# Patient Record
Sex: Female | Born: 1990 | Race: White | Hispanic: No | Smoking: Former smoker
Health system: Southern US, Community
[De-identification: ages and names within clinical notes are randomized; demographics above are authoritative.]

## PROBLEM LIST (undated history)

## (undated) ENCOUNTER — Inpatient Hospital Stay (HOSPITAL_COMMUNITY): Payer: Self-pay

## (undated) DIAGNOSIS — N390 Urinary tract infection, site not specified: Secondary | ICD-10-CM

## (undated) DIAGNOSIS — C539 Malignant neoplasm of cervix uteri, unspecified: Secondary | ICD-10-CM

## (undated) DIAGNOSIS — A749 Chlamydial infection, unspecified: Secondary | ICD-10-CM

## (undated) DIAGNOSIS — R51 Headache: Secondary | ICD-10-CM

## (undated) DIAGNOSIS — F32A Depression, unspecified: Secondary | ICD-10-CM

## (undated) DIAGNOSIS — F329 Major depressive disorder, single episode, unspecified: Secondary | ICD-10-CM

## (undated) DIAGNOSIS — F419 Anxiety disorder, unspecified: Secondary | ICD-10-CM

## (undated) HISTORY — PX: HAND SURGERY: SHX662

## (undated) HISTORY — DX: Anxiety disorder, unspecified: F41.9

---

## 2012-06-06 ENCOUNTER — Encounter (HOSPITAL_COMMUNITY): Payer: Self-pay | Admitting: Emergency Medicine

## 2012-06-06 ENCOUNTER — Emergency Department (HOSPITAL_COMMUNITY)
Admission: EM | Admit: 2012-06-06 | Discharge: 2012-06-06 | Disposition: A | Discharge status: Home / Self Care | Payer: Self-pay | Attending: Emergency Medicine | Admitting: Emergency Medicine

## 2012-06-06 DIAGNOSIS — R109 Unspecified abdominal pain: Secondary | ICD-10-CM | POA: Insufficient documentation

## 2012-06-06 DIAGNOSIS — Z3202 Encounter for pregnancy test, result negative: Secondary | ICD-10-CM | POA: Insufficient documentation

## 2012-06-06 DIAGNOSIS — N938 Other specified abnormal uterine and vaginal bleeding: Secondary | ICD-10-CM | POA: Insufficient documentation

## 2012-06-06 LAB — URINALYSIS, ROUTINE W REFLEX MICROSCOPIC
Bilirubin Urine: NEGATIVE
Nitrite: NEGATIVE
Specific Gravity, Urine: 1.034 — ABNORMAL HIGH (ref 1.005–1.030)
Urobilinogen, UA: 0.2 mg/dL (ref 0.0–1.0)
pH: 5.5 (ref 5.0–8.0)

## 2012-06-06 LAB — URINE MICROSCOPIC-ADD ON

## 2012-06-06 LAB — POCT I-STAT, CHEM 8
BUN: 9 mg/dL (ref 6–23)
Calcium, Ion: 1.23 mmol/L (ref 1.12–1.23)
Hemoglobin: 11.6 g/dL — ABNORMAL LOW (ref 12.0–15.0)
Sodium: 142 mEq/L (ref 135–145)
TCO2: 24 mmol/L (ref 0–100)

## 2012-06-06 LAB — POCT PREGNANCY, URINE: Preg Test, Ur: NEGATIVE

## 2012-06-06 NOTE — ED Notes (Signed)
Pt presenting to ed with c/o vaginal bleeding x 2 weeks with positive abdominal pain. Pt denies nausea, vomiting and diarrhea at this time

## 2012-06-06 NOTE — ED Provider Notes (Signed)
History     CSN: [id number]  Arrival date & time 06/06/12  1200   First MD Initiated Contact with Patient 06/06/12 1244      Chief Complaint  Patient presents with  . Vaginal Bleeding      Patient is a 22 y.o. female presenting with vaginal bleeding. The history is provided by the patient.  Vaginal Bleeding This is a new problem. The current episode started more than 1 week ago. The problem occurs daily. The problem has been gradually worsening. Associated symptoms include abdominal pain. Pertinent negatives include no chest pain and no shortness of breath. Nothing aggravates the symptoms. Nothing relieves the symptoms. She has tried rest for the symptoms. The treatment provided mild relief.    PMH - none  History reviewed. No pertinent past surgical history.  No family history on file.  History  Substance Use Topics  . Smoking status: Never Smoker   . Smokeless tobacco: Not on file  . Alcohol Use: No     Comment: rarely    OB History   Grav Para Term Preterm Abortions TAB SAB Ect Mult Living                  Review of Systems  Respiratory: Negative for shortness of breath.   Cardiovascular: Negative for chest pain.  Gastrointestinal: Positive for abdominal pain.  Genitourinary: Positive for vaginal bleeding.    Allergies  Review of patient's allergies indicates no known allergies.  Home Medications   Current Outpatient Rx  Name  Route  Sig  Dispense  Refill  . acetaminophen (TYLENOL) 500 MG tablet   Oral   Take 1,000 mg by mouth every 6 (six) hours as needed for pain.         . Aspirin-Acetaminophen-Caffeine (GOODY HEADACHE PO)   Oral   Take 1 packet by mouth every 6 (six) hours as needed (pain).           BP 110/54  Pulse 80  Temp(Src) 98.3 F (36.8 C) (Oral)  Resp 16  SpO2 99%  LMP 05/23/2012  Physical Exam CONSTITUTIONAL: Well developed/well nourished HEAD: Normocephalic/atraumatic EYES: EOMI/PERRL ENMT: Mucous membranes moist NECK:  supple no meningeal signs SPINE:entire spine nontender CV: S1/S2 noted, no murmurs/rubs/gallops noted LUNGS: Lungs are clear to auscultation bilaterally, no apparent distress ABDOMEN: soft, nontender, no rebound or guarding GU:no cva tenderness Blood in vaginal vault.  Os is closed.  No cmt  And no adnexal tenderness.  Chaperone present NEURO: Pt is awake/alert, moves all extremitiesx4 EXTREMITIES: pulses normal, full ROM SKIN: warm, color normal PSYCH: no abnormalities of mood noted  ED Course  Procedures (including critical care time)  Labs Reviewed  URINALYSIS, ROUTINE W REFLEX MICROSCOPIC - Abnormal; Notable for the following:    Specific Gravity, Urine 1.034 (*)    Hgb urine dipstick LARGE (*)    Leukocytes, UA SMALL (*)    All other components within normal limits  URINE MICROSCOPIC-ADD ON - Abnormal; Notable for the following:    Squamous Epithelial / LPF FEW (*)    Bacteria, UA FEW (*)    All other components within normal limits  POCT I-STAT, CHEM 8 - Abnormal; Notable for the following:    Hemoglobin 11.6 (*)    HCT 34.0 (*)    All other components within normal limits  URINE CULTURE  POCT PREGNANCY, URINE   No results found.   1. Abnormal vaginal bleeding       MDM  Nursing notes including past  medical history and social history reviewed and considered in documentation Labs/vital reviewed and considered  Stable for outpatient followup, discussed need to have gyn f/u and start iron tablets         Joya Gaskins, MD 06/06/12 1539

## 2012-06-08 LAB — URINE CULTURE: Colony Count: >=100000

## 2012-06-09 ENCOUNTER — Telehealth (HOSPITAL_COMMUNITY): Payer: Self-pay | Admitting: Emergency Medicine

## 2012-06-09 NOTE — ED Notes (Signed)
Patient has +Urine culture. Checking to see if treated appropriately. °

## 2012-06-09 NOTE — ED Notes (Signed)
+   Urine Chart sent to EDP office for review. 

## 2012-06-13 ENCOUNTER — Telehealth (HOSPITAL_COMMUNITY): Payer: Self-pay | Admitting: Emergency Medicine

## 2012-09-20 ENCOUNTER — Encounter (HOSPITAL_COMMUNITY): Payer: Self-pay | Admitting: Emergency Medicine

## 2012-09-20 ENCOUNTER — Emergency Department (HOSPITAL_COMMUNITY)
Admission: EM | Admit: 2012-09-20 | Discharge: 2012-09-20 | Disposition: A | Discharge status: Home / Self Care | Payer: Medicaid Other | Attending: Emergency Medicine | Admitting: Emergency Medicine

## 2012-09-20 DIAGNOSIS — R52 Pain, unspecified: Secondary | ICD-10-CM | POA: Insufficient documentation

## 2012-09-20 DIAGNOSIS — R109 Unspecified abdominal pain: Secondary | ICD-10-CM | POA: Insufficient documentation

## 2012-09-20 DIAGNOSIS — S40029A Contusion of unspecified upper arm, initial encounter: Secondary | ICD-10-CM | POA: Insufficient documentation

## 2012-09-20 DIAGNOSIS — Y929 Unspecified place or not applicable: Secondary | ICD-10-CM | POA: Insufficient documentation

## 2012-09-20 DIAGNOSIS — Z331 Pregnant state, incidental: Secondary | ICD-10-CM | POA: Insufficient documentation

## 2012-09-20 DIAGNOSIS — X58XXXA Exposure to other specified factors, initial encounter: Secondary | ICD-10-CM | POA: Insufficient documentation

## 2012-09-20 DIAGNOSIS — N898 Other specified noninflammatory disorders of vagina: Secondary | ICD-10-CM | POA: Insufficient documentation

## 2012-09-20 DIAGNOSIS — Y939 Activity, unspecified: Secondary | ICD-10-CM | POA: Insufficient documentation

## 2012-09-20 DIAGNOSIS — Z3201 Encounter for pregnancy test, result positive: Secondary | ICD-10-CM

## 2012-09-20 LAB — POCT PREGNANCY, URINE: Preg Test, Ur: POSITIVE — AB
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
## 2012-09-20 NOTE — ED Provider Notes (Signed)
Medical screening examination/treatment/procedure(s) were performed by non-physician practitioner and as supervising physician I was immediately available for consultation/collaboration.   Codie Krogh, MD 09/20/12 1718 

## 2012-09-24 ENCOUNTER — Inpatient Hospital Stay (HOSPITAL_COMMUNITY)
Admission: AD | Admit: 2012-09-24 | Discharge: 2012-09-24 | Disposition: A | Discharge status: Home / Self Care | Payer: Medicaid Other | Source: Ambulatory Visit | Attending: Family Medicine | Admitting: Family Medicine

## 2012-09-24 ENCOUNTER — Inpatient Hospital Stay (HOSPITAL_COMMUNITY): Payer: Medicaid Other

## 2012-09-24 ENCOUNTER — Encounter (HOSPITAL_COMMUNITY): Payer: Self-pay

## 2012-09-24 DIAGNOSIS — N949 Unspecified condition associated with female genital organs and menstrual cycle: Secondary | ICD-10-CM | POA: Insufficient documentation

## 2012-09-24 DIAGNOSIS — M62838 Other muscle spasm: Secondary | ICD-10-CM

## 2012-09-24 DIAGNOSIS — R109 Unspecified abdominal pain: Secondary | ICD-10-CM | POA: Insufficient documentation

## 2012-09-24 DIAGNOSIS — Z3201 Encounter for pregnancy test, result positive: Secondary | ICD-10-CM

## 2012-09-24 DIAGNOSIS — O99891 Other specified diseases and conditions complicating pregnancy: Secondary | ICD-10-CM | POA: Insufficient documentation

## 2012-09-24 HISTORY — DX: Chlamydial infection, unspecified: A74.9

## 2012-09-24 HISTORY — DX: Urinary tract infection, site not specified: N39.0

## 2012-09-24 LAB — HCG, QUANTITATIVE, PREGNANCY: hCG, Beta Chain, Quant, S: 575 m[IU]/mL — ABNORMAL HIGH (ref <5)

## 2012-09-24 MED ORDER — CYCLOBENZAPRINE HCL 10 MG PO TABS: 10.0000 mg | ORAL_TABLET | Freq: Three times a day (TID) | ORAL | Status: DC | PRN
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
## 2012-09-24 NOTE — MAU Provider Note (Signed)
Chart reviewed and agree with management and plan.  

## 2012-09-24 NOTE — MAU Note (Signed)
Pt states here for flank pain on left side only. Pain noted x1.5 weeks. Frequency and urgency with voiding. Also has vaginal discharge that has mild odor, was dx'd with bv in March, took meds, however still having same s/s.

## 2012-09-26 ENCOUNTER — Inpatient Hospital Stay (HOSPITAL_COMMUNITY)
Admission: AD | Admit: 2012-09-26 | Discharge: 2012-09-26 | Disposition: A | Discharge status: Home / Self Care | Payer: Medicaid Other | Source: Ambulatory Visit | Attending: Obstetrics & Gynecology | Admitting: Obstetrics & Gynecology

## 2012-09-26 DIAGNOSIS — N949 Unspecified condition associated with female genital organs and menstrual cycle: Secondary | ICD-10-CM | POA: Insufficient documentation

## 2012-09-26 DIAGNOSIS — O26899 Other specified pregnancy related conditions, unspecified trimester: Secondary | ICD-10-CM

## 2012-09-26 DIAGNOSIS — O99891 Other specified diseases and conditions complicating pregnancy: Secondary | ICD-10-CM | POA: Insufficient documentation
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
## 2012-09-26 NOTE — MAU Note (Signed)
Patient to MAU for follow up BHCG. Patient denies bleeding or pain.  

## 2012-09-26 NOTE — MAU Provider Note (Signed)
Attestation of Attending Supervision of Advanced Practitioner (PA/CNM/NP): Evaluation and management procedures were performed by the Advanced Practitioner under my supervision and collaboration.  I have reviewed the Advanced Practitioner's note and chart, and I agree with the management and plan.  Eugen Jeansonne, MD, FACOG Attending Obstetrician & Gynecologist Faculty Practice, [facility]  

## 2012-10-02 ENCOUNTER — Encounter: Payer: Self-pay | Admitting: Family

## 2012-10-02 ENCOUNTER — Ambulatory Visit (INDEPENDENT_AMBULATORY_CARE_PROVIDER_SITE_OTHER): Payer: Medicaid Other | Admitting: Family

## 2012-10-02 VITALS — BP 100/68 | Temp 97.3°F | Wt 196.5 lb

## 2012-10-02 DIAGNOSIS — Z3491 Encounter for supervision of normal pregnancy, unspecified, first trimester: Secondary | ICD-10-CM | POA: Insufficient documentation

## 2012-10-02 DIAGNOSIS — Z3480 Encounter for supervision of other normal pregnancy, unspecified trimester: Secondary | ICD-10-CM

## 2012-10-02 LAB — POCT URINALYSIS DIP (DEVICE)
Bilirubin Urine: NEGATIVE
Ketones, ur: NEGATIVE mg/dL
Nitrite: NEGATIVE
Protein, ur: NEGATIVE mg/dL

## 2012-10-02 MED ORDER — NITROFURANTOIN MONOHYD MACRO 100 MG PO CAPS: 100.0000 mg | ORAL_CAPSULE | Freq: Two times a day (BID) | ORAL | Status: DC

## 2012-10-02 NOTE — Progress Notes (Signed)
New OB visit; Initial ultrasound did not confirm IUP and "tiny cysts on endometrium", repeat rise in BHCG was wnl ; ultrasound already scheduled for tomorrow at 12:45PM; reports no nausea, feeling fatigued.   Deferred discussion on genetic testing to await confirmation of IUP; OB labs and pap smear obtained.  Return in three weeks, if viable pregnancy discuss genetic testing options.  Large leuk in urine > RX Macrobid and urine culture sent.   Exam   BP 100/68  Temp(Src) 97.3 F (36.3 C)  Wt 196 lb 8 oz (89.132 kg)  BMI 32.7 kg/m2  LMP 08/08/2012 Uterine Size: size equals dates  Pelvic Exam:    Perineum: No Hemorrhoids, Normal Perineum   Vulva: normal   Vagina:  normal mucosa, normal discharge, no palpable nodules   pH: Not done   Cervix: Slight bleeding following Pap, no cervical motion tenderness and no lesions   Adnexa: normal adnexa and no mass, fullness, tenderness   Bony Pelvis: Adequate  System: Breast:  No nipple retraction or dimpling, No nipple discharge or bleeding, No axillary or supraclavicular adenopathy, Normal to palpation without dominant masses   Skin: normal coloration and turgor, no rashes    Neurologic: negative   Extremities: normal strength, tone, and muscle mass   HEENT neck supple with midline trachea and thyroid without masses   Mouth/Teeth mucous membranes moist, pharynx normal without lesions   Neck supple and no masses   Cardiovascular: regular rate and rhythm, no murmurs or gallops   Respiratory:  appears well, vitals normal, no respiratory distress, acyanotic, normal RR, neck free of mass or lymphadenopathy, chest clear, no wheezing, crepitations, rhonchi, normal symmetric air entry   Abdomen: soft, non-tender; bowel sounds normal; no masses,  no organomegaly   Urinary: urethral meatus normal

## 2012-10-02 NOTE — Progress Notes (Signed)
Pulse- 88 Patient reports d/c has slight odor to it

## 2012-10-03 ENCOUNTER — Encounter (HOSPITAL_COMMUNITY): Payer: Self-pay

## 2012-10-03 ENCOUNTER — Ambulatory Visit (HOSPITAL_COMMUNITY)
Admission: RE | Admit: 2012-10-03 | Discharge: 2012-10-03 | Disposition: A | Discharge status: Home / Self Care | Payer: Medicaid Other | Source: Ambulatory Visit | Attending: Family | Admitting: Family

## 2012-10-03 ENCOUNTER — Inpatient Hospital Stay (HOSPITAL_COMMUNITY)
Admission: AD | Admit: 2012-10-03 | Discharge: 2012-10-03 | Disposition: A | Discharge status: Home / Self Care | Payer: Medicaid Other | Source: Ambulatory Visit | Attending: Obstetrics & Gynecology | Admitting: Obstetrics & Gynecology

## 2012-10-03 ENCOUNTER — Encounter: Payer: Self-pay | Admitting: Family

## 2012-10-03 DIAGNOSIS — R102 Pelvic and perineal pain: Secondary | ICD-10-CM

## 2012-10-03 DIAGNOSIS — Z3491 Encounter for supervision of normal pregnancy, unspecified, first trimester: Secondary | ICD-10-CM

## 2012-10-03 DIAGNOSIS — Z09 Encounter for follow-up examination after completed treatment for conditions other than malignant neoplasm: Secondary | ICD-10-CM

## 2012-10-03 DIAGNOSIS — O99891 Other specified diseases and conditions complicating pregnancy: Secondary | ICD-10-CM | POA: Insufficient documentation

## 2012-10-03 DIAGNOSIS — Z3689 Encounter for other specified antenatal screening: Secondary | ICD-10-CM | POA: Insufficient documentation

## 2012-10-03 DIAGNOSIS — O3680X0 Pregnancy with inconclusive fetal viability, not applicable or unspecified: Secondary | ICD-10-CM | POA: Insufficient documentation

## 2012-10-03 DIAGNOSIS — Z3490 Encounter for supervision of normal pregnancy, unspecified, unspecified trimester: Secondary | ICD-10-CM

## 2012-10-03 LAB — OBSTETRIC PANEL
Basophils Relative: 0 % (ref 0–1)
Eosinophils Absolute: 0.1 10*3/uL (ref 0.0–0.7)
Eosinophils Relative: 2 % (ref 0–5)
Lymphs Abs: 2.4 10*3/uL (ref 0.7–4.0)
MCH: 28.2 pg (ref 26.0–34.0)
MCHC: 34.3 g/dL (ref 30.0–36.0)
MCV: 82.3 fL (ref 78.0–100.0)
Monocytes Relative: 5 % (ref 3–12)
Platelets: 288 10*3/uL (ref 150–400)
RBC: 4.68 MIL/uL (ref 3.87–5.11)
Rh Type: POSITIVE

## 2012-10-03 LAB — GLUCOSE TOLERANCE, 1 HOUR (50G) W/O FASTING: Glucose, 1 Hour GTT: 105 mg/dL (ref 70–140)

## 2012-10-03 LAB — HIV ANTIBODY (ROUTINE TESTING W REFLEX): HIV: NONREACTIVE
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
## 2012-10-03 NOTE — MAU Note (Signed)
Patient to MAU after ultrasound. Denies pain or bleeding.  

## 2012-10-03 NOTE — MAU Provider Note (Signed)
Attestation of Attending Supervision of Advanced Practitioner (PA/CNM/NP): Evaluation and management procedures were performed by the Advanced Practitioner under my supervision and collaboration.  I have reviewed the Advanced Practitioner's note and chart, and I agree with the management and plan.  Crystalina Stodghill, MD, FACOG Attending Obstetrician & Gynecologist Faculty Practice, [facility]  

## 2012-10-04 LAB — CULTURE, OB URINE: Colony Count: 70000

## 2012-10-23 ENCOUNTER — Ambulatory Visit (INDEPENDENT_AMBULATORY_CARE_PROVIDER_SITE_OTHER): Payer: Medicaid Other | Admitting: Family

## 2012-10-23 ENCOUNTER — Ambulatory Visit (HOSPITAL_COMMUNITY)
Admission: RE | Admit: 2012-10-23 | Discharge: 2012-10-23 | Disposition: A | Discharge status: Home / Self Care | Payer: Medicaid Other | Source: Ambulatory Visit | Attending: Family | Admitting: Family

## 2012-10-23 VITALS — BP 109/70 | Temp 98.3°F | Wt 198.0 lb

## 2012-10-23 DIAGNOSIS — O3680X0 Pregnancy with inconclusive fetal viability, not applicable or unspecified: Secondary | ICD-10-CM | POA: Insufficient documentation

## 2012-10-23 DIAGNOSIS — Z3689 Encounter for other specified antenatal screening: Secondary | ICD-10-CM | POA: Insufficient documentation

## 2012-10-23 DIAGNOSIS — O209 Hemorrhage in early pregnancy, unspecified: Secondary | ICD-10-CM | POA: Insufficient documentation

## 2012-10-23 DIAGNOSIS — Z3480 Encounter for supervision of other normal pregnancy, unspecified trimester: Secondary | ICD-10-CM

## 2012-10-23 DIAGNOSIS — Z3491 Encounter for supervision of normal pregnancy, unspecified, first trimester: Secondary | ICD-10-CM

## 2012-10-23 LAB — POCT URINALYSIS DIP (DEVICE)
Glucose, UA: NEGATIVE mg/dL
Ketones, ur: NEGATIVE mg/dL
Protein, ur: 100 mg/dL — AB
Specific Gravity, Urine: >=1.03 (ref 1.005–1.030)
Urobilinogen, UA: 0.2 mg/dL (ref 0.0–1.0)

## 2012-10-23 NOTE — Progress Notes (Signed)
Reviewed lab results and new EDD; slight spotting past few days; desires integrated > schedule; bedside ultrasound > no FHR seen; ultrasound today for viability.

## 2012-10-23 NOTE — Progress Notes (Signed)
Pulse: 73

## 2012-10-23 NOTE — Progress Notes (Addendum)
[place] @ clinic for viability- fetal cardiac activity not visualized.  Pt scheduled for Transvag US today in radiology @ 1315 - she is unable to stay @ hospital now for [place] to be completed. Eino Farber [facility] CNM notified.

## 2012-10-25 ENCOUNTER — Inpatient Hospital Stay (HOSPITAL_COMMUNITY)
Admission: AD | Admit: 2012-10-25 | Discharge: 2012-10-25 | Disposition: A | Discharge status: Home / Self Care | Payer: Medicaid Other | Source: Ambulatory Visit | Attending: Obstetrics & Gynecology | Admitting: Obstetrics & Gynecology

## 2012-10-25 ENCOUNTER — Encounter (HOSPITAL_COMMUNITY): Payer: Self-pay | Admitting: *Deleted

## 2012-10-25 DIAGNOSIS — O039 Complete or unspecified spontaneous abortion without complication: Secondary | ICD-10-CM

## 2012-10-25 DIAGNOSIS — Z3491 Encounter for supervision of normal pregnancy, unspecified, first trimester: Secondary | ICD-10-CM

## 2012-10-25 LAB — CBC
HCT: 35.7 % — ABNORMAL LOW (ref 36.0–46.0)
Hemoglobin: 11.9 g/dL — ABNORMAL LOW (ref 12.0–15.0)
MCH: 28.3 pg (ref 26.0–34.0)
MCHC: 33.3 g/dL (ref 30.0–36.0)
MCV: 84.8 fL (ref 78.0–100.0)
RBC: 4.21 MIL/uL (ref 3.87–5.11)

## 2012-10-25 MED ORDER — ACETAMINOPHEN-CODEINE 300-30 MG PO TABS: 1.0000 | ORAL_TABLET | ORAL | Status: DC | PRN

## 2012-10-25 MED ORDER — MISOPROSTOL 200 MCG PO TABS: 200.0000 ug | ORAL_TABLET | Freq: Once | ORAL | Status: DC

## 2012-10-25 MED ORDER — IBUPROFEN 600 MG PO TABS: 600.0000 mg | ORAL_TABLET | Freq: Four times a day (QID) | ORAL | Status: DC | PRN

## 2012-10-25 MED ORDER — IBUPROFEN 600 MG PO TABS
600.0000 mg | ORAL_TABLET | Freq: Once | ORAL | Status: AC
  Administered 2012-10-25: 600 mg via ORAL
  Filled 2012-10-25: qty 1
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
## 2012-10-31 ENCOUNTER — Encounter: Payer: Medicaid Other | Admitting: Obstetrics and Gynecology

## 2012-11-20 ENCOUNTER — Encounter: Payer: Medicaid Other | Admitting: Family Medicine

## 2012-12-02 ENCOUNTER — Encounter (HOSPITAL_COMMUNITY): Payer: Self-pay | Admitting: Certified Registered"

## 2012-12-02 ENCOUNTER — Emergency Department (HOSPITAL_COMMUNITY): Payer: Medicaid Other | Admitting: Certified Registered"

## 2012-12-02 ENCOUNTER — Emergency Department (HOSPITAL_COMMUNITY)
Admission: EM | Admit: 2012-12-02 | Discharge: 2012-12-02 | Disposition: A | Discharge status: Home / Self Care | Payer: Medicaid Other | Attending: Emergency Medicine | Admitting: Emergency Medicine

## 2012-12-02 ENCOUNTER — Emergency Department (HOSPITAL_COMMUNITY): Payer: Medicaid Other

## 2012-12-02 ENCOUNTER — Encounter (HOSPITAL_COMMUNITY)
Admission: EM | Disposition: A | Discharge status: Home / Self Care | Payer: Self-pay | Source: Home / Self Care | Attending: Emergency Medicine

## 2012-12-02 ENCOUNTER — Other Ambulatory Visit: Payer: Self-pay | Admitting: Orthopedic Surgery

## 2012-12-02 ENCOUNTER — Encounter (HOSPITAL_COMMUNITY): Payer: Self-pay | Admitting: *Deleted

## 2012-12-02 DIAGNOSIS — S42453A Displaced fracture of lateral condyle of unspecified humerus, initial encounter for closed fracture: Secondary | ICD-10-CM | POA: Insufficient documentation

## 2012-12-02 DIAGNOSIS — S42401A Unspecified fracture of lower end of right humerus, initial encounter for closed fracture: Secondary | ICD-10-CM

## 2012-12-02 HISTORY — PX: ORIF ELBOW FRACTURE: SHX5031

## 2012-12-02 SURGERY — OPEN REDUCTION INTERNAL FIXATION (ORIF) ELBOW/OLECRANON FRACTURE
Anesthesia: General | Site: Arm Upper | Laterality: Right | Wound class: Clean Contaminated

## 2012-12-02 MED ORDER — CEFAZOLIN SODIUM-DEXTROSE 2-3 GM-% IV SOLR
2.0000 g | INTRAVENOUS | Status: AC
  Administered 2012-12-02: 2 g via INTRAVENOUS
  Filled 2012-12-02: qty 50

## 2012-12-02 MED ORDER — HYDROMORPHONE HCL PF 1 MG/ML IJ SOLN
0.5000 mg | Freq: Once | INTRAMUSCULAR | Status: AC
  Administered 2012-12-02: 0.5 mg via INTRAVENOUS

## 2012-12-02 MED ORDER — ONDANSETRON HCL 4 MG/2ML IJ SOLN: 4.0000 mg | Freq: Once | INTRAMUSCULAR | Status: DC | PRN

## 2012-12-02 MED ORDER — FENTANYL CITRATE 0.05 MG/ML IJ SOLN
INTRAMUSCULAR | Status: DC | PRN
  Administered 2012-12-02 (×2): 100 ug via INTRAVENOUS

## 2012-12-02 MED ORDER — PROMETHAZINE HCL 25 MG/ML IJ SOLN
INTRAMUSCULAR | Status: AC
  Filled 2012-12-02: qty 1

## 2012-12-02 MED ORDER — ONDANSETRON HCL 4 MG/2ML IJ SOLN
INTRAMUSCULAR | Status: DC | PRN
  Administered 2012-12-02: 4 mg via INTRAVENOUS

## 2012-12-02 MED ORDER — PROMETHAZINE HCL 25 MG/ML IJ SOLN
25.0000 mg | Freq: Once | INTRAMUSCULAR | Status: AC
  Administered 2012-12-02: 12.5 mg via INTRAVENOUS

## 2012-12-02 MED ORDER — HYDROMORPHONE HCL PF 1 MG/ML IJ SOLN
INTRAMUSCULAR | Status: AC
  Filled 2012-12-02: qty 1

## 2012-12-02 MED ORDER — HYDROMORPHONE HCL 2 MG PO TABS: 2.0000 mg | ORAL_TABLET | ORAL | Status: DC | PRN

## 2012-12-02 MED ORDER — HYDROMORPHONE HCL PF 1 MG/ML IJ SOLN
0.2500 mg | INTRAMUSCULAR | Status: DC | PRN
  Administered 2012-12-02 (×2): 0.25 mg via INTRAVENOUS
  Administered 2012-12-02: 0.5 mg via INTRAVENOUS

## 2012-12-02 MED ORDER — BUPIVACAINE HCL (PF) 0.25 % IJ SOLN: INTRAMUSCULAR | Status: DC | PRN

## 2012-12-02 MED ORDER — CHLORHEXIDINE GLUCONATE 4 % EX LIQD: 60.0000 mL | Freq: Once | CUTANEOUS | Status: DC

## 2012-12-02 MED ORDER — PROPOFOL 10 MG/ML IV EMUL
INTRAVENOUS | Status: AC
  Filled 2012-12-02: qty 100

## 2012-12-02 MED ORDER — LIDOCAINE HCL (CARDIAC) 20 MG/ML IV SOLN
INTRAVENOUS | Status: DC | PRN
  Administered 2012-12-02: 80 mg via INTRAVENOUS

## 2012-12-02 MED ORDER — PROPOFOL 10 MG/ML IV BOLUS
INTRAVENOUS | Status: AC | PRN
  Administered 2012-12-02: 130 mg via INTRAVENOUS

## 2012-12-02 MED ORDER — HYDROMORPHONE HCL 2 MG PO TABS
2.0000 mg | ORAL_TABLET | Freq: Once | ORAL | Status: AC
  Administered 2012-12-02: 2 mg via ORAL

## 2012-12-02 MED ORDER — 0.9 % SODIUM CHLORIDE (POUR BTL) OPTIME
TOPICAL | Status: DC | PRN
  Administered 2012-12-02: 1000 mL

## 2012-12-02 MED ORDER — MIDAZOLAM HCL 5 MG/5ML IJ SOLN
INTRAMUSCULAR | Status: DC | PRN
  Administered 2012-12-02: 2 mg via INTRAVENOUS

## 2012-12-02 MED ORDER — LACTATED RINGERS IV SOLN
INTRAVENOUS | Status: DC
  Administered 2012-12-02 (×2): via INTRAVENOUS

## 2012-12-02 MED ORDER — PROPOFOL 10 MG/ML IV BOLUS
INTRAVENOUS | Status: DC | PRN
  Administered 2012-12-02: 170 mg via INTRAVENOUS

## 2012-12-02 SURGICAL SUPPLY — 81 items
BANDAGE ELASTIC 4 VELCRO ST LF (GAUZE/BANDAGES/DRESSINGS) ×2 IMPLANT
BANDAGE GAUZE ELAST BULKY 4 IN (GAUZE/BANDAGES/DRESSINGS) ×2 IMPLANT
BIT DRILL 2.5X2.75 QC CALB (BIT) ×4 IMPLANT
BIT DRILL 2.9 CANN QC NONSTRL (BIT) ×2 IMPLANT
BLADE MINI RND TIP GREEN BEAV (BLADE) IMPLANT
BNDG CMPR 9X4 STRL LF SNTH (GAUZE/BANDAGES/DRESSINGS) ×1
BNDG ESMARK 4X9 LF (GAUZE/BANDAGES/DRESSINGS) ×2 IMPLANT
CLOTH BEACON ORANGE TIMEOUT ST (SAFETY) ×2 IMPLANT
CORDS BIPOLAR (ELECTRODE) ×2 IMPLANT
COVER MAYO STAND STRL (DRAPES) IMPLANT
COVER SURGICAL LIGHT HANDLE (MISCELLANEOUS) ×2 IMPLANT
CUFF TOURNIQUET SINGLE 18IN (TOURNIQUET CUFF) IMPLANT
CUFF TOURNIQUET SINGLE 24IN (TOURNIQUET CUFF) IMPLANT
DRAPE OEC MINIVIEW 54X84 (DRAPES) IMPLANT
DRAPE SURG 17X23 STRL (DRAPES) ×2 IMPLANT
DRSG PAD ABDOMINAL 8X10 ST (GAUZE/BANDAGES/DRESSINGS) ×2 IMPLANT
ELECT NEEDLE TIP 2.8 STRL (NEEDLE) IMPLANT
GAUZE XEROFORM 1X8 LF (GAUZE/BANDAGES/DRESSINGS) IMPLANT
GAUZE XEROFORM 5X9 LF (GAUZE/BANDAGES/DRESSINGS) ×2 IMPLANT
GLOVE BIO SURGEON STRL SZ7 (GLOVE) ×2 IMPLANT
GLOVE BIO SURGEON STRL SZ8.5 (GLOVE) ×2 IMPLANT
GLOVE ORTHO TXT STRL SZ7.5 (GLOVE) ×2 IMPLANT
GLOVE SURG SS PI 6.0 STRL IVOR (GLOVE) ×4 IMPLANT
GOWN PREVENTION PLUS XLARGE (GOWN DISPOSABLE) ×2 IMPLANT
GOWN STRL NON-REIN LRG LVL3 (GOWN DISPOSABLE) ×2 IMPLANT
K-WIRE ACE 1.6X6 (WIRE) ×2
K-WIRE FIXATION 2.0X6 (WIRE) ×2
KIT BASIN OR (CUSTOM PROCEDURE TRAY) ×2 IMPLANT
KIT ROOM TURNOVER OR (KITS) ×2 IMPLANT
KWIRE ACE 1.6X6 (WIRE) ×1 IMPLANT
KWIRE FIXATION 2.0X6 (WIRE) ×1 IMPLANT
LOOP VESSEL MAXI BLUE (MISCELLANEOUS) IMPLANT
MANIFOLD NEPTUNE II (INSTRUMENTS) ×2 IMPLANT
NEEDLE HYPO 25GX1X1/2 BEV (NEEDLE) IMPLANT
NS IRRIG 1000ML POUR BTL (IV SOLUTION) ×2 IMPLANT
PACK ORTHO EXTREMITY (CUSTOM PROCEDURE TRAY) ×2 IMPLANT
PAD ARMBOARD 7.5X6 YLW CONV (MISCELLANEOUS) ×4 IMPLANT
PAD CAST 4YDX4 CTTN HI CHSV (CAST SUPPLIES) IMPLANT
PADDING CAST ABS 4INX4YD NS (CAST SUPPLIES) ×1
PADDING CAST ABS COTTON 4X4 ST (CAST SUPPLIES) ×1 IMPLANT
PADDING CAST COTTON 4X4 STRL (CAST SUPPLIES)
PENCIL BUTTON HOLSTER BLD 10FT (ELECTRODE) IMPLANT
PLATE LOCK RT SM (Plate) ×4 IMPLANT
PLATE LOCK RT SM 7 HL (Plate) ×1 IMPLANT
PLATE LOCK RT SM 88X10.9X2.5X9 (Plate) ×1 IMPLANT
SCREW  RD HEAD THR 4.0 48 LTH (Screw) ×2 IMPLANT
SCREW CORT 3.5X32 (Screw) ×4 IMPLANT
SCREW CORT T15 24X3.5XST LCK (Screw) ×1 IMPLANT
SCREW CORT T15 28X3.5XST LCK (Screw) ×2 IMPLANT
SCREW CORT T15 30X3.5XST LCK (Screw) ×2 IMPLANT
SCREW CORT T15 32X3.5XST LCK (Screw) ×2 IMPLANT
SCREW CORTICAL 3.5X24MM (Screw) ×2 IMPLANT
SCREW CORTICAL 3.5X28MM (Screw) ×4 IMPLANT
SCREW CORTICAL 3.5X30MM (Screw) ×4 IMPLANT
SCREW LOCK CORT STAR 3.5X16 (Screw) ×2 IMPLANT
SCREW LOCK CORT STAR 3.5X24 (Screw) ×2 IMPLANT
SCREW LOCK CORT STAR 3.5X30 (Screw) ×4 IMPLANT
SCREW LOW PROFILE 22MMX3.5MM (Screw) ×2 IMPLANT
SCREW LP 3.5X44 (Screw) ×2 IMPLANT
SOLUTION BETADINE 4OZ (MISCELLANEOUS) ×2 IMPLANT
SPLINT PLASTER CAST XFAST 5X30 (CAST SUPPLIES) ×1 IMPLANT
SPLINT PLASTER XFAST SET 5X30 (CAST SUPPLIES) ×1
SPONGE GAUZE 4X4 12PLY (GAUZE/BANDAGES/DRESSINGS) ×2 IMPLANT
SPONGE LAP 4X18 X RAY DECT (DISPOSABLE) ×4 IMPLANT
SPONGE SCRUB IODOPHOR (GAUZE/BANDAGES/DRESSINGS) ×2 IMPLANT
STAPLER VISISTAT 35W (STAPLE) ×2 IMPLANT
STRIP CLOSURE SKIN 1/2X4 (GAUZE/BANDAGES/DRESSINGS) IMPLANT
SUCTION FRAZIER TIP 10 FR DISP (SUCTIONS) IMPLANT
SUT PROLENE 3 0 PS 2 (SUTURE) IMPLANT
SUT VIC AB 0 CT1 27 (SUTURE) ×4
SUT VIC AB 0 CT1 27XBRD ANBCTR (SUTURE) ×2 IMPLANT
SUT VIC AB 2-0 CT1 27 (SUTURE) ×4
SUT VIC AB 2-0 CT1 TAPERPNT 27 (SUTURE) ×2 IMPLANT
SUT VIC AB 2-0 FS1 27 (SUTURE) IMPLANT
SYR BULB 3OZ (MISCELLANEOUS) ×2 IMPLANT
SYR CONTROL 10ML LL (SYRINGE) IMPLANT
TOWEL OR 17X24 6PK STRL BLUE (TOWEL DISPOSABLE) ×2 IMPLANT
TOWEL OR 17X26 10 PK STRL BLUE (TOWEL DISPOSABLE) ×4 IMPLANT
TUBE CONNECTING 12X1/4 (SUCTIONS) IMPLANT
UNDERPAD 30X30 INCONTINENT (UNDERPADS AND DIAPERS) ×2 IMPLANT
WATER STERILE IRR 1000ML POUR (IV SOLUTION) ×2 IMPLANT

## 2012-12-02 NOTE — Transfer of Care (Signed)
Immediate Anesthesia Transfer of Care Note  Patient: Kylie [name]  Procedure(s) Performed: Procedure(s): OPEN REDUCTION INTERNAL FIXATION (ORIF) ELBOW/OLECRANON FRACTURE (Right)  Patient Location: PACU  Anesthesia Type:General and GA combined with regional for post-op pain  Level of Consciousness: awake, alert  and oriented  Airway & Oxygen Therapy: Patient Spontanous Breathing and Patient connected to nasal cannula oxygen  Post-op Assessment: Report given to PACU RN and Post -op Vital signs reviewed and stable  Post vital signs: Reviewed and stable  Complications: No apparent anesthesia complications

## 2012-12-02 NOTE — ED Notes (Signed)
Patient to radiology at this time.
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
## 2012-12-02 NOTE — ED Notes (Signed)
Patient given blankets for comfort measures 
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
## 2012-12-02 NOTE — Anesthesia Procedure Notes (Addendum)
Anesthesia Regional Block:  Infraclavicular brachial plexus block  Pre-Anesthetic Checklist: ,, timeout performed, Correct Patient, Correct Site, Correct Laterality, Correct Procedure, Correct Position, site marked, Risks and benefits discussed,  Surgical consent,  Pre-op evaluation,  At surgeon's request and post-op pain management  Laterality: Right  Prep: chloraprep and alcohol swabs       Needles:  Injection technique: Single-shot  Needle Type: Stimulator Needle - 80        Needle insertion depth: 5 cm   Additional Needles:  Procedures: ultrasound guided (picture in chart) and nerve stimulator Infraclavicular brachial plexus block  Nerve Stimulator or Paresthesia:  Response: 0.5 mA, 0.1 ms, 5 cm  Additional Responses:   Narrative:  Start time: 12/02/2012 5:15 PM End time: 12/02/2012 5:20 PM Injection made incrementally with aspirations every 5 mL.  Performed by: Personally  Anesthesiologist: Laverle Hobby MD    Additional Notes: Pt accepts procedure and risks. 15cc 0.5% Marcaine w/ epi w/o difficulty or discomfort. Pt tolerated well. GES   Procedure Name: LMA Insertion Date/Time: 12/02/2012 5:25 PM Performed by: Arlice Colt B Pre-anesthesia Checklist: Patient identified, Emergency Drugs available, Suction available, Patient being monitored and Timeout performed Patient Re-evaluated:Patient Re-evaluated prior to inductionOxygen Delivery Method: Circle system utilized Preoxygenation: Pre-oxygenation with 100% oxygen Intubation Type: IV induction LMA: LMA inserted LMA Size: 4.0 Number of attempts: 1 Placement Confirmation: positive ETCO2 and breath sounds checked- equal and bilateral Tube secured with: Tape Dental Injury: Teeth and Oropharynx as per pre-operative assessment

## 2012-12-02 NOTE — ED Notes (Signed)
Patient returned from ct at this time
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
## 2012-12-02 NOTE — Anesthesia Preprocedure Evaluation (Addendum)
Anesthesia Evaluation  Patient identified by MRN, date of birth, ID band Patient awake    Reviewed: Allergy & Precautions, H&P , NPO status , Patient's Chart, lab work & pertinent test results  Airway Mallampati: I TM Distance: >3 FB Neck ROM: Full    Dental  (+) Teeth Intact   Pulmonary Current Smoker,          Cardiovascular Rhythm:Regular Rate:Normal     Neuro/Psych    GI/Hepatic   Endo/Other    Renal/GU      Musculoskeletal   Abdominal   Peds  Hematology   Anesthesia Other Findings   Reproductive/Obstetrics                          Anesthesia Physical Anesthesia Plan  ASA: II  Anesthesia Plan: General   Post-op Pain Management:    Induction: Intravenous  Airway Management Planned: Oral ETT and LMA  Additional Equipment:   Intra-op Plan:   Post-operative Plan: Extubation in OR  Informed Consent: I have reviewed the patients History and Physical, chart, labs and discussed the procedure including the risks, benefits and alternatives for the proposed anesthesia with the patient or authorized representative who has indicated his/her understanding and acceptance.     Plan Discussed with: CRNA, Anesthesiologist and Surgeon  Anesthesia Plan Comments:        Anesthesia Quick Evaluation

## 2012-12-02 NOTE — Op Note (Signed)
See note [id number]
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
## 2012-12-02 NOTE — Progress Notes (Signed)
Orthopedic Tech Progress Note Patient Details:  Kylie [name] 08/27/1990 [id number] Applied posterior long arm fiberglass splint to RUE.  Pulses, capillary refill, motion and sensation intact before and after splinting.  Capillary refill less than 2 seconds. Ortho Devices Type of Ortho Device: Post (long arm) splint Ortho Device/Splint Location: RUE Ortho Device/Splint Interventions: Application   Lesle Chris 12/02/2012, 4:23 PM

## 2012-12-02 NOTE — OR Nursing (Signed)
Patient attempting to contact a ride and individual who can stay with her until her boyfriend can be at home with her. If unable to find any other source of transport and care, her boyfriend can be here at 1:30 AM.
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
## 2012-12-03 ENCOUNTER — Encounter (HOSPITAL_COMMUNITY): Payer: Self-pay | Admitting: Orthopedic Surgery
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
## 2012-12-03 NOTE — Anesthesia Postprocedure Evaluation (Signed)
  Anesthesia Post-op Note  Patient: Kylie [name]  Procedure(s) Performed: Procedure(s): OPEN REDUCTION INTERNAL FIXATION (ORIF) ELBOW/OLECRANON FRACTURE (Right)  Patient Location: [name]

## 2013-01-30 ENCOUNTER — Other Ambulatory Visit: Payer: Self-pay

## 2013-04-29 ENCOUNTER — Emergency Department (HOSPITAL_COMMUNITY)
Admission: EM | Admit: 2013-04-29 | Discharge: 2013-04-29 | Disposition: A | Discharge status: Home / Self Care | Payer: Medicaid Other | Attending: Emergency Medicine | Admitting: Emergency Medicine

## 2013-04-29 ENCOUNTER — Encounter (HOSPITAL_COMMUNITY): Payer: Self-pay | Admitting: Emergency Medicine

## 2013-04-29 DIAGNOSIS — F17200 Nicotine dependence, unspecified, uncomplicated: Secondary | ICD-10-CM | POA: Insufficient documentation

## 2013-04-29 DIAGNOSIS — G8911 Acute pain due to trauma: Secondary | ICD-10-CM | POA: Insufficient documentation

## 2013-04-29 DIAGNOSIS — Z8619 Personal history of other infectious and parasitic diseases: Secondary | ICD-10-CM | POA: Insufficient documentation

## 2013-04-29 DIAGNOSIS — R209 Unspecified disturbances of skin sensation: Secondary | ICD-10-CM | POA: Insufficient documentation

## 2013-04-29 DIAGNOSIS — Z87440 Personal history of urinary (tract) infections: Secondary | ICD-10-CM | POA: Insufficient documentation

## 2013-04-29 DIAGNOSIS — M25521 Pain in right elbow: Secondary | ICD-10-CM

## 2013-04-29 DIAGNOSIS — M549 Dorsalgia, unspecified: Secondary | ICD-10-CM

## 2013-04-29 DIAGNOSIS — G8929 Other chronic pain: Secondary | ICD-10-CM

## 2013-04-29 DIAGNOSIS — M546 Pain in thoracic spine: Secondary | ICD-10-CM | POA: Insufficient documentation

## 2013-04-29 MED ORDER — METHOCARBAMOL 500 MG PO TABS: 500.0000 mg | ORAL_TABLET | Freq: Two times a day (BID) | ORAL | Status: DC

## 2013-04-29 MED ORDER — OXYCODONE-ACETAMINOPHEN 5-325 MG PO TABS
1.0000 | ORAL_TABLET | Freq: Once | ORAL | Status: AC
  Administered 2013-04-29: 1 via ORAL
  Filled 2013-04-29: qty 1

## 2013-04-29 MED ORDER — OXYCODONE-ACETAMINOPHEN 5-325 MG PO TABS: 2.0000 | ORAL_TABLET | ORAL | Status: DC | PRN
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
## 2013-04-29 NOTE — Discharge Instructions (Signed)
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
## 2013-04-29 NOTE — ED Provider Notes (Signed)
Medical screening examination/treatment/procedure(s) were performed by non-physician practitioner and as supervising physician I was immediately available for consultation/collaboration.  Megan E Docherty, MD 04/29/13 2305 

## 2013-04-29 NOTE — ED Notes (Signed)
PT ambulated with baseline gait; VSS; A&Ox3; no signs of distress; respirations even and unlabored; skin warm and dry; no questions upon discharge.  

## 2013-08-21 ENCOUNTER — Encounter (HOSPITAL_COMMUNITY): Payer: Self-pay | Admitting: *Deleted

## 2013-08-21 ENCOUNTER — Inpatient Hospital Stay (HOSPITAL_COMMUNITY)
Admission: AD | Admit: 2013-08-21 | Discharge: 2013-08-21 | Disposition: A | Discharge status: Home / Self Care | Payer: Medicaid Other | Source: Ambulatory Visit | Attending: Obstetrics & Gynecology | Admitting: Obstetrics & Gynecology

## 2013-08-21 DIAGNOSIS — R109 Unspecified abdominal pain: Secondary | ICD-10-CM | POA: Insufficient documentation

## 2013-08-21 DIAGNOSIS — B373 Candidiasis of vulva and vagina: Secondary | ICD-10-CM | POA: Insufficient documentation

## 2013-08-21 DIAGNOSIS — N390 Urinary tract infection, site not specified: Secondary | ICD-10-CM | POA: Insufficient documentation

## 2013-08-21 DIAGNOSIS — F17200 Nicotine dependence, unspecified, uncomplicated: Secondary | ICD-10-CM | POA: Insufficient documentation

## 2013-08-21 DIAGNOSIS — R197 Diarrhea, unspecified: Secondary | ICD-10-CM | POA: Insufficient documentation

## 2013-08-21 DIAGNOSIS — B3731 Acute candidiasis of vulva and vagina: Secondary | ICD-10-CM | POA: Insufficient documentation

## 2013-08-21 DIAGNOSIS — B379 Candidiasis, unspecified: Secondary | ICD-10-CM

## 2013-08-21 DIAGNOSIS — A5901 Trichomonal vulvovaginitis: Secondary | ICD-10-CM | POA: Insufficient documentation

## 2013-08-21 DIAGNOSIS — R112 Nausea with vomiting, unspecified: Secondary | ICD-10-CM | POA: Insufficient documentation

## 2013-08-21 HISTORY — DX: Headache: R51

## 2013-08-21 HISTORY — DX: Depression, unspecified: F32.A

## 2013-08-21 HISTORY — DX: Major depressive disorder, single episode, unspecified: F32.9

## 2013-08-21 LAB — URINALYSIS, ROUTINE W REFLEX MICROSCOPIC
Bilirubin Urine: NEGATIVE
GLUCOSE, UA: NEGATIVE mg/dL
HGB URINE DIPSTICK: NEGATIVE
Ketones, ur: NEGATIVE mg/dL
Nitrite: NEGATIVE
Protein, ur: NEGATIVE mg/dL
UROBILINOGEN UA: 0.2 mg/dL (ref 0.0–1.0)
pH: 6 (ref 5.0–8.0)

## 2013-08-21 LAB — WET PREP, GENITAL

## 2013-08-21 LAB — CBC
HCT: 40.9 % (ref 36.0–46.0)
Hemoglobin: 13.8 g/dL (ref 12.0–15.0)
MCH: 29.4 pg (ref 26.0–34.0)
MCHC: 33.7 g/dL (ref 30.0–36.0)
MCV: 87.2 fL (ref 78.0–100.0)
Platelets: 263 10*3/uL (ref 150–400)
RBC: 4.69 MIL/uL (ref 3.87–5.11)
RDW: 13.6 % (ref 11.5–15.5)
WBC: 9.2 10*3/uL (ref 4.0–10.5)

## 2013-08-21 LAB — COMPREHENSIVE METABOLIC PANEL
ALT: 14 U/L (ref 0–35)
AST: 15 U/L (ref 0–37)
Albumin: 3.9 g/dL (ref 3.5–5.2)
Alkaline Phosphatase: 104 U/L (ref 39–117)
BUN: 11 mg/dL (ref 6–23)
CO2: 29 mEq/L (ref 19–32)
CREATININE: 0.84 mg/dL (ref 0.50–1.10)
Calcium: 9.4 mg/dL (ref 8.4–10.5)
Chloride: 101 mEq/L (ref 96–112)
GFR calc Af Amer: >90 mL/min (ref >90)
Glucose, Bld: 67 mg/dL — ABNORMAL LOW (ref 70–99)
Potassium: 3.8 mEq/L (ref 3.7–5.3)
Sodium: 141 mEq/L (ref 137–147)
Total Bilirubin: 0.2 mg/dL — ABNORMAL LOW (ref 0.3–1.2)
Total Protein: 7.5 g/dL (ref 6.0–8.3)

## 2013-08-21 LAB — URINE MICROSCOPIC-ADD ON

## 2013-08-21 LAB — POCT PREGNANCY, URINE: Preg Test, Ur: NEGATIVE

## 2013-08-21 MED ORDER — METRONIDAZOLE 500 MG PO TABS: 2000.0000 mg | ORAL_TABLET | Freq: Once | ORAL | Status: DC

## 2013-08-21 MED ORDER — ONDANSETRON 8 MG PO TBDP
8.0000 mg | ORAL_TABLET | Freq: Once | ORAL | Status: AC
  Administered 2013-08-21: 8 mg via ORAL
  Filled 2013-08-21: qty 1

## 2013-08-21 MED ORDER — CIPROFLOXACIN HCL 500 MG PO TABS: 500.0000 mg | ORAL_TABLET | Freq: Two times a day (BID) | ORAL | Status: DC

## 2013-08-21 MED ORDER — KETOROLAC TROMETHAMINE 60 MG/2ML IM SOLN
60.0000 mg | Freq: Once | INTRAMUSCULAR | Status: AC
  Administered 2013-08-21: 60 mg via INTRAMUSCULAR
  Filled 2013-08-21: qty 2

## 2013-08-21 MED ORDER — IBUPROFEN 800 MG PO TABS: 800.0000 mg | ORAL_TABLET | Freq: Three times a day (TID) | ORAL | Status: DC

## 2013-08-21 MED ORDER — FLUCONAZOLE 150 MG PO TABS: 150.0000 mg | ORAL_TABLET | Freq: Every day | ORAL | Status: DC

## 2013-08-21 MED ORDER — ONDANSETRON 8 MG PO TBDP: 8.0000 mg | ORAL_TABLET | Freq: Three times a day (TID) | ORAL | Status: DC | PRN

## 2013-08-21 NOTE — MAU Provider Note (Signed)
Attestation of Attending Supervision of Advanced Practitioner (CNM/NP): Evaluation and management procedures were performed by the Advanced Practitioner under my supervision and collaboration. I have reviewed the Advanced Practitioner's note and chart, and I agree with the management and plan.  Guss Bunde 7:16 PM

## 2013-08-21 NOTE — MAU Note (Signed)
Patient states she started having nausea, vomiting, abdominal pain and headaches since she ate at a different restaurant 3 days ago. Denies bleeding or discharge.

## 2013-08-21 NOTE — MAU Note (Signed)
Pt started having n/v/occ diarrhea, stomach cramps and HA after eating... Daughter was also having stomach ache and nausea.

## 2013-08-21 NOTE — Discharge Instructions (Signed)
Tula Nakayama Guide (Revised August 2014)   Chronic Pain Problems:    [facility]:  [phone]           Patients need to be referred by their primary care doctor/specialist  Insufficient Money for Medicine:           United Way: call "211"     MAP Program at [place] or HP [phone]            No Primary Care Doctor:  To locate a primary care doctor that accepts your insurance or provides certain services:           [place]: [phone]           Physician Referral Service: [phone] ask for My [place]   If no insurance, you need to see if you qualify for [facility] orange card, call to set      up appointment for eligibility/enrollment at [phone] or [phone] or visit [place] ([address], [place] and [place]) to meet with a [facility] enrollment specialist.  Agencies that provide inexpensive (sliding fee scale) medical care:        [facility] - Family Medicine at [place] - [phone]      [facility] and [place] - [facility] - [place] [phone]      [facility] for Children - [place] [phone]   [facility] - [place] @ [place] [phone]   [facility] - [place] @ [place] - [phone]   [facility]: [phone]    Women's Clinic: [phone]    Planned Parenthood: [phone]    [facility]    [place] Providers:           [facility] [phone] (No Family Planning accepted)          [address], [phone], Mon-Fri 9am-5pm           [facility] - [phone]   [place], [address] [place], Mon-Thursday 8am-5pm, Fri 8am-noon   [place] - [place], [address], Mon-Fri 7:30am-4:30pm          [place] - [phone]          [address] [facility] - [phone] [address]          Only accepts [employer] patients after they have their name applied to their card  Self Pay (no insurance) in [facility]:           Sickle Cell Patients:    [place], [phone] [facility]:   [address] [place] [phone]       [facility] and [place],  [place] [phone]  [place]:   [address] [phone]          [facility]           [place], [phone] [facility] for [place], [phone]           [facility]gent Care [place]           [place], [address], [address]           [phone], Mon-Fri 9am-7pm, [place] 9am-1pm          [facility] - Family Medicine @ [facility]          [place], [place]          [facility] - [facility]           [address] [place] [facility] and [place]   [address], [place] [phone]          [place], [place]  [facility] - [place]    [place], [place] [phone] [facility] and [place]   [address], [phone]  Dr. Vista Lawman           [address], [place],  [facility]           [address] [phone]          [facility]             [address], [phone]          [facility]           [place], [place], 1st & 3rd Saturday every month, 10am-1pm  OTHERS:  [employer] [employer]  ([facility] Only)  [phone] (Thursday only)  Strategies for finding a Primary Care Provider:  1) Find a Doctor and Pay Out of Pocket  Although you won't have to find out who is covered by your insurance plan, it is a good idea to ask around and get recommendations. You will then need to call the office and see if the doctor you have chosen will accept you as a new patient and what types of options they offer for patients who are self-pay. Some doctors offer discounts or will set up payment plans for their patients who do not have insurance, but you will need to ask so you aren't surprised when you get to your appointment.  2) [place] - To see if you qualify for orange card access to healthcare safety net providers.  Call for appointment for eligibility/enrollment at [phone] or [phone]. (Uninsured, 0-200% FPL, qualifying info)  Applicants for [facility] are first required to see if they are eligible to enroll in the [facility] Marketplace before enrolling in [facility] (and get an exemption if they are not).  GCCN Criteria for acceptance is:    Proof of [employer] exemption - form or documentation  Valid photo ID (driver's license, state identification card, passport, home country ID)    Proof of [facility] residency (e.g. drivers license, lease/landlord information, pay stubs with address, utility bill, bank statement, etc.)    Proof of income (1040, last year's tax return, W2, 4 current pay stubs, other income proof)    Proof of assets (current bank statement + 3 most recent, disability paperwork, life insurance info, tax value on autos, etc.)  3)  [place] Department  Not all health departments have doctors that can see patients for sick visits, but many do, so it is worth a call to see if yours does. If you don't know where your local health department is, you can check in your phone book. The CDC also has a tool to help you locate your state's health department, and many state websites also have listings of all of their local health departments.  4) Find a [facility]  If your illness is not likely to be very severe or complicated, you may want to try a walk in clinic. These are popping up all over the country in pharmacies, drugstores, and shopping centers. They're usually staffed by nurse practitioners or physician assistants that have been trained to treat common illnesses and complaints. They're usually fairly quick and inexpensive. However, if you have serious medical issues or chronic medical problems, these are probably not your best option   STD Testing:           [place], [place] [facility]           [address] [place], phone [phone] or [phone]           Monday - Friday, call for an appointment          [place], [place] [facility]           [address], [place], phone [phone] or [phone]           Monday - Friday, call for an appointment Abuse/Neglect:           [place]: Orange: [phone] (After Hours)  Emergency Shelter:  [facility] [phone]  [place]- [phone]  [place] - [phone]  Youth Focus - Act Together - [phone] (ages 52-17)  [place] @ [employer] - [phone]   Mammograms - Free at [facility] [place]:           Room at the [place]: [phone]   (Homeless mother with  children)          [place]: [phone] (Mothers only)   Youth Focus: [phone] (Pregnant 58-70 years old)   Adopt a Mom [phone]  [facility]    [facility] and [place]   [address] [place] [phone]          [facility]           [address], [place]  [place] Dept.           [place], [place]          [facility]           [phone]          [facility] in [place]           [phone], [place]           [phone]           [phone] (After Hours)  [place] Abuse Resources:           Alcohol and Drug Services: [phone]           [facility]: [phone]          [facility]: [phone]    Narcotics Helpline - [phone]          [facility]: [phone]           Residential & Outpatient Substance Abuse Program - [place]: [phone]   [employer]  [place] and [place] - [phone] Psychological Services:          [place]: [phone]    Therapeutic Alternatives: [phone]          [place]           [address]: [phone]     (24 Hour)   Mobile Crisis:    [place]:  Radio producer on [place] [phone] [facility] on [place] [phone]   Walk In [place]  ([place] - [phone] or [phone]  [place]. [place] [phone]  [place] [address], [place] [phone]   Dental Assistance:  If unable to pay or uninsured, contact: [facility]. to become qualified for the adult dental clinic. Patient must be enrolled in [facility] (uninsured, 0-200% FPL, qualifying info).  Enroll in [facility] first, then see Primary Care Physician assigned to you, the PCP makes a dental referral. [facility] will receive referral and contacts patient for appointment.  Patients with Medicaid           [address], [place] (Children up to 46 + Pregnant Women) - [phone]  [place] - [address] [phone]  If unable to pay, or uninsured: contact [place] [phone] in [place] - ([place] only + Pregnant Women), [phone] in [place] only) to become qualified for the adult dental clinic  Must see if eligible to enroll in [place] before enrolling into the [facility] (exemption  required) [phone] for an appointment)  [email or web address];   [phone].  If not eligible for ACA, then go by Department of Health and Human Services to see if eligible for orange card.  [address], [place].  Once you get an orange card, you will have a Primary Care home who will then refer you to dental if needed.        Other Scientist, forensic:   [facility] - [phone] (ext [phone])   [address]  Dr. Donn Pierini - [phone]   [place]   [facility]           [place], [place], [place], [address]           [phone], Ext. [phone]           2nd and 4th Thursday of the month at 6:30am (Simple  extractions only - no wisdom teeth or surgery) First come/First serve -First 10 clients served           [facility] [place], [place] and [place] residents only)          [address], [place], [place], [address]           [phone]                    [place] Department           [phone]          [place]         [facility]          [phone]   Transportation Options:  Ambulance - 911 - [phone] per ride Family Member to accompany patient (if stable) - [place] - [phone]  PART - [phone]  Taxi - [phone] - [place] [phone] (Application required)  [facility] - [phone]     Candida Infection, Adult A candida infection (also called yeast, fungus and Monilia infection) is an overgrowth of yeast that can occur anywhere on the body. A yeast infection commonly occurs in warm, moist body areas. Usually, the infection remains localized but can spread to become a systemic infection. A yeast infection may be a sign of a more severe disease such as diabetes, leukemia, or AIDS. A yeast infection can occur in both men and women. In women, Candida vaginitis is a vaginal infection. It is one of the most common causes of vaginitis. Men usually do not have symptoms or know they have an infection until other problems develop. Men may find out they have a yeast infection because their sex partner has a yeast infection. Uncircumcised men are more likely to get a yeast infection than circumcised men. This is because the uncircumcised glans is not exposed to air and does not remain as dry as that of a circumcised glans. Older adults may develop yeast infections around dentures. CAUSES  Women  Antibiotics.  Steroid medication taken for a long time.  Being overweight (obese).  Diabetes.  Poor  immune condition.  Certain serious medical conditions.  Immune suppressive medications for organ transplant patients.  Chemotherapy.  Pregnancy.  Menstration.  Stress and fatigue.  Intravenous drug use.  Oral contraceptives.  Wearing tight-fitting clothes in the crotch area.  Catching it from a sex partner who has a yeast infection.  Spermicide.  Intravenous, urinary, or other catheters. Men  Catching it from a sex partner who has a yeast infection.  Having oral or anal sex with a person who has the infection.  Spermicide.  Diabetes.  Antibiotics.  Poor immune system.  Medications that suppress the immune system.  Intravenous drug use.  Intravenous, urinary, or other catheters. SYMPTOMS  Women  Thick, white vaginal discharge.  Vaginal itching.  Redness and swelling in and around the vagina.  Irritation of the lips of the vagina and perineum.  Blisters on the vaginal lips and perineum.  Painful sexual intercourse.  Low blood sugar (hypoglycemia).  Painful urination.  Bladder infections.  Intestinal problems such as constipation, indigestion, bad breath, bloating, increase in gas, diarrhea, or loose stools. Men  Men may develop intestinal problems such as constipation, indigestion, bad breath, bloating, increase in gas, diarrhea, or loose stools.  Dry, cracked skin on the penis with itching or discomfort.  Jock itch.  Dry, flaky skin.  Athlete's foot.  Hypoglycemia. DIAGNOSIS  Women  A history and an exam are performed.  The discharge may be examined under a microscope.  A culture may be taken of the discharge. Men  A history and an exam are performed.  Any discharge from the penis or areas of cracked skin will be looked at under the microscope and cultured.  Stool samples may be cultured. TREATMENT  Women  Vaginal antifungal suppositories and creams.  Medicated creams to decrease irritation and itching on the outside of  the vagina.  Warm compresses to the perineal area to decrease swelling and discomfort.  Oral antifungal medications.  Medicated vaginal suppositories or cream for repeated or recurrent infections.  Wash and dry the irritation areas before applying the cream.  Eating yogurt with lactobacillus may help with prevention and treatment.  Sometimes painting the vagina with gentian violet solution may help if creams and suppositories do not work. Men  Antifungal creams and oral antifungal medications.  Sometimes treatment must continue for 30 days after the symptoms go away to prevent recurrence. HOME CARE INSTRUCTIONS  Women  Use cotton underwear and avoid tight-fitting clothing.  Avoid colored, scented toilet paper and deodorant tampons or pads.  Do not douche.  Keep your diabetes under control.  Finish all the prescribed medications.  Keep your skin clean and dry.  Consume milk or yogurt with lactobacillus active culture regularly. If you get frequent yeast infections and think that is what the infection is, there are over-the-counter medications that you can get. If the infection does not show healing in 3 days, talk to your caregiver.  Tell your sex partner you have a yeast infection. Your partner may need treatment also, especially if your infection does not clear up or recurs. Men  Keep your skin clean and dry.  Keep your diabetes under control.  Finish all prescribed medications.  Tell your sex partner that you have a yeast infection so they can be treated if necessary. SEEK MEDICAL CARE IF:   Your symptoms do not clear up or worsen in one week after treatment.  You have an oral temperature above 102 F (38.9 C).  You have trouble swallowing or eating for a prolonged time.  You develop blisters on and around your vagina.  You develop vaginal bleeding and it is not your menstrual period.  You  develop abdominal pain.  You develop intestinal problems as  mentioned above.  You get weak or lightheaded.  You have painful or increased urination.  You have pain during sexual intercourse. MAKE SURE YOU:   Understand these instructions.  Will watch your condition.  Will get help right away if you are not doing well or get worse. Document Released: 04/20/2004 Document Revised: 06/05/2011 Document Reviewed: 08/02/2009 [facility] Patient Information 2014 [place], [place]. Urinary Tract Infection Urinary tract infections (UTIs) can develop anywhere along your urinary tract. Your urinary tract is your body's drainage system for removing wastes and extra water. Your urinary tract includes two kidneys, two ureters, a bladder, and a urethra. Your kidneys are a pair of bean-shaped organs. Each kidney is about the size of your fist. They are located below your ribs, one on each side of your spine. CAUSES Infections are caused by microbes, which are microscopic organisms, including fungi, viruses, and bacteria. These organisms are so small that they can only be seen through a microscope. Bacteria are the microbes that most commonly cause UTIs. SYMPTOMS  Symptoms of UTIs may vary by age and gender of the patient and by the location of the infection. Symptoms in young women typically include a frequent and intense urge to urinate and a painful, burning feeling in the bladder or urethra during urination. Older women and men are more likely to be tired, shaky, and weak and have muscle aches and abdominal pain. A fever may mean the infection is in your kidneys. Other symptoms of a kidney infection include pain in your back or sides below the ribs, nausea, and vomiting. DIAGNOSIS To diagnose a UTI, your caregiver will ask you about your symptoms. Your caregiver also will ask to provide a urine sample. The urine sample will be tested for bacteria and white blood cells. White blood cells are made by your body to help fight infection. TREATMENT  Typically, UTIs can be  treated with medication. Because most UTIs are caused by a bacterial infection, they usually can be treated with the use of antibiotics. The choice of antibiotic and length of treatment depend on your symptoms and the type of bacteria causing your infection. HOME CARE INSTRUCTIONS  If you were prescribed antibiotics, take them exactly as your caregiver instructs you. Finish the medication even if you feel better after you have only taken some of the medication.  Drink enough water and fluids to keep your urine clear or pale yellow.  Avoid caffeine, tea, and carbonated beverages. They tend to irritate your bladder.  Empty your bladder often. Avoid holding urine for long periods of time.  Empty your bladder before and after sexual intercourse.  After a bowel movement, women should cleanse from front to back. Use each tissue only once. SEEK MEDICAL CARE IF:   You have back pain.  You develop a fever.  Your symptoms do not begin to resolve within 3 days. SEEK IMMEDIATE MEDICAL CARE IF:   You have severe back pain or lower abdominal pain.  You develop chills.  You have nausea or vomiting.  You have continued burning or discomfort with urination. MAKE SURE YOU:   Understand these instructions.  Will watch your condition.  Will get help right away if you are not doing well or get worse. Document Released: 12/21/2004 Document Revised: 09/12/2011 Document Reviewed: 04/21/2011 [facility] Patient Information 2014 [place], [place]. Trichinosis Trichinosis is an infection caused by eating the raw, or poorly cooked, meat of meat-eating animals that are infected. The  infection is caused by eating the encysted larvae (like a small egg with a tiny worm inside) of the nematode (very small worm) called Trichinella spiralis. It is found in the infected meat of these animals. The seriousness of the disease is usually related to the number of larvae eaten. Once the cyst is in the intestines, the larva  is released. These tiny worms then enter the small blood vessels in the intestines and travel throughout the body. They can infect all tissues of the body. SYMPTOMS  When the larvae are in the intestine, they can cause diarrhea and abdominal (belly) pain. There may be swelling around the eyes and muscle aches and pains. The diaphragm (breathing muscle between the chest and abdomen), chest muscles between the ribs, and the muscles of the face and the tongue are also affected. This disease is usually self limited. That means you will usually get well in time without treatment. It can rarely result in death only if there are complications from heavy invasion of the heart, lungs, or central nervous system (brain and spinal cord). DIAGNOSIS  Laboratory blood tests are available that can usually make a positive diagnosis. Examining the infected muscle under the microscope may also help with the diagnosis. If laboratory work is performed, make sure you know how you are to obtain the results. It is your responsibility to follow up and obtain your laboratory results. TREATMENT   There is no known treatment for Trichinosis except for treatment of symptoms. Usually anti-inflammatory medications are used.  Only take over-the-counter or prescription medicines for pain, discomfort, or fever as directed by your caregiver. Discontinue immediately if you have stomach upset. Take medicine only as directed by your caregiver.  Thiabendazole is used as a medication for known exposure. Steroids are also used for severe cases.  Most symptoms will get better on their own within a year without lasting problems. However, you will be infected for the rest of your life. You cannot pass this infection on to another person even with close personal contact. SEEK IMMEDIATE MEDICAL CARE IF:  You develop any new symptoms such as vomiting, severe headache, stiff or painful neck, chest pain, shortness of breath, trouble breathing, or  develop pain uncontrolled with medications.  You develop new problems or worsening of the problems that originally brought you in for care. Document Released: 06/19/2000 Document Revised: 06/05/2011 Document Reviewed: 07/18/2007 [facility] Patient Information 2014 [place].

## 2013-08-21 NOTE — MAU Provider Note (Signed)
History     CSN: [id number]  Arrival date and time: 08/21/13 1451   First Provider Initiated Contact with Patient 08/21/13 1703      Chief Complaint  Patient presents with  . Headache  . Abdominal Pain  . Emesis   HPI Comments: Kylie [name] 23 y.o. Presents to MAU today with nausea, vomiting, diarrhea, abdominal pain and a headache for the past 3 days. She states she ate at a new pizza restaurant 3 days ago and these symptoms started 4-5 hours after that. Denies fever and chills. Abdominal pain is characterized as cramping, rated 6/10. Unable to eat any solid food since yesterday but able to drink water and ginger ale. Diarrhea is loose to watery, with 1-2 stools in the past 24 hours. Denies blood in stool. Has taken Pepto-Bismol and Tylenol for symptoms with minimal relief. Pain and nausea are worse with constant moving at work. Denies increased vaginal discharge, urinary frequency or dysuria. States she previously had 2-3 urinary tract infections a year, but has not been treated for any in past 12 months.      Past Medical History  Diagnosis Date  . UTI (lower urinary tract infection)   . Chlamydia   . Headache(784.0)   . 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 Orif elbow fracture Right 12/02/2012    Procedure: OPEN REDUCTION INTERNAL FIXATION (ORIF) ELBOW/OLECRANON FRACTURE;  Surgeon: Schuyler Amor, MD;  Location: [facility];  Service: Orthopedics;  Laterality: Right;    Family History  Problem Relation Age of Onset  . Hyperlipidemia Father   . Hypertension Father   . Cancer Paternal Grandmother   . Kidney disease Paternal Grandmother   . Diabetes Paternal Grandfather   . Cancer Paternal Aunt   . Hearing loss Neg Hx     History  Substance Use Topics  . Smoking status: Current Every Day Smoker -- 0.25 packs/day for 1 years    Types: Cigarettes  . Smokeless tobacco: Current User     Comment:  electronic- e-sigs-mostly  . Alcohol Use: Yes     Comment: rarely    Allergies: No Known Allergies  Prescriptions prior to admission  Medication Sig Dispense Refill  . acetaminophen (TYLENOL) 500 MG tablet Take 1,000 mg by mouth every 8 (eight) hours as needed for mild pain.        Review of Systems  Constitutional: Negative for fever and chills.  HENT: Negative.   Respiratory: Negative.   Cardiovascular: Negative.   Gastrointestinal: Positive for nausea, vomiting, abdominal pain and diarrhea.  Genitourinary: Negative.   Musculoskeletal: Negative.   All other systems reviewed and are negative.  Physical Exam   Blood pressure 117/75, pulse 80, temperature 99.3 F (37.4 C), temperature source Oral, resp. rate 16, height 5' 3.5" (1.613 m), weight 90.039 kg (198 lb 8 oz), last menstrual period 08/07/2013, SpO2 100.00%.  Physical Exam  Nursing note and vitals reviewed. Constitutional: She appears well-developed and well-nourished. No distress.  HENT:  Head: Normocephalic and atraumatic.  Eyes: Pupils are equal, round, and reactive to light.  Neck: Normal range of motion.  Cardiovascular: Normal rate, regular rhythm, normal heart sounds and intact distal pulses.   Respiratory: Effort normal and breath sounds normal.  GI: Soft. She exhibits no distension and no mass. Bowel sounds are increased. Tenderness: denies tenderness with palpation  There is no rebound and no guarding.  Genitourinary:  External: Negative for  lesions Vagina: Moderate white discharge present Cervix: Pink. Closed, thick. No CMT. Cultures obtained. Bimanual: No uterine or adnexal tenderness, no masses palpated.   Musculoskeletal: Normal range of motion.  Neurological: She is alert.  Skin: Skin is warm and dry.  Psychiatric: She has a normal mood and affect. Her behavior is normal. Judgment and thought content normal.   Results for orders placed during the hospital encounter of 08/21/13 (from the past 24  hour(s))  URINALYSIS, ROUTINE W REFLEX MICROSCOPIC     Status: Abnormal   Collection Time    08/21/13  5:15 PM      Result Value Ref Range   Color, Urine YELLOW  YELLOW   APPearance CLEAR  CLEAR   Specific Gravity, Urine >1.030 (*) 1.005 - 1.030   pH 6.0  5.0 - 8.0   Glucose, UA NEGATIVE  NEGATIVE mg/dL   Hgb urine dipstick NEGATIVE  NEGATIVE   Bilirubin Urine NEGATIVE  NEGATIVE   Ketones, ur NEGATIVE  NEGATIVE mg/dL   Protein, ur NEGATIVE  NEGATIVE mg/dL   Urobilinogen, UA 0.2  0.0 - 1.0 mg/dL   Nitrite NEGATIVE  NEGATIVE   Leukocytes, UA MODERATE (*) NEGATIVE  URINE MICROSCOPIC-ADD ON     Status: Abnormal   Collection Time    08/21/13  5:15 PM      Result Value Ref Range   Squamous Epithelial / LPF MANY (*) RARE   WBC, UA 11-20  <3 WBC/hpf   RBC / HPF 3-6  <3 RBC/hpf   Bacteria, UA MANY (*) RARE   Urine-Other RARE YEAST    POCT PREGNANCY, URINE     Status: None   Collection Time    08/21/13  5:23 PM      Result Value Ref Range   Preg Test, Ur NEGATIVE  NEGATIVE  CBC     Status: None   Collection Time    08/21/13  5:24 PM      Result Value Ref Range   WBC 9.2  4.0 - 10.5 K/uL   RBC 4.69  3.87 - 5.11 MIL/uL   Hemoglobin 13.8  12.0 - 15.0 g/dL   HCT 40.9  36.0 - 46.0 %   MCV 87.2  78.0 - 100.0 fL   MCH 29.4  26.0 - 34.0 pg   MCHC 33.7  30.0 - 36.0 g/dL   RDW 13.6  11.5 - 15.5 %   Platelets 263  150 - 400 K/uL  COMPREHENSIVE METABOLIC PANEL     Status: Abnormal   Collection Time    08/21/13  5:24 PM      Result Value Ref Range   Sodium 141  137 - 147 mEq/L   Potassium 3.8  3.7 - 5.3 mEq/L   Chloride 101  96 - 112 mEq/L   CO2 29  19 - 32 mEq/L   Glucose, Bld 67 (*) 70 - 99 mg/dL   BUN 11  6 - 23 mg/dL   Creatinine, Ser 0.84  0.50 - 1.10 mg/dL   Calcium 9.4  8.4 - 10.5 mg/dL   Total Protein 7.5  6.0 - 8.3 g/dL   Albumin 3.9  3.5 - 5.2 g/dL   AST 15  0 - 37 U/L   ALT 14  0 - 35 U/L   Alkaline Phosphatase 104  39 - 117 U/L   Total Bilirubin 0.2 (*) 0.3 - 1.2  mg/dL   GFR calc non Af Amer >90  >90 mL/min   GFR calc Af Amer >90  >  90 mL/min  WET PREP, GENITAL     Status: Abnormal   Collection Time    08/21/13  6:00 PM      Result Value Ref Range   Yeast Wet Prep HPF POC FEW (*) NONE SEEN   Trich, Wet Prep FEW (*) NONE SEEN   Clue Cells Wet Prep HPF POC FEW (*) NONE SEEN   WBC, Wet Prep HPF POC MANY (*) NONE SEEN     MAU Course  Procedures  MDM UA, CBC and Chem panel Ketorlac 60 mg IM and Zofran 8mg  ODT here once UA-shows UTI and rare yeast, obtaining Wet Prep and GC/Chlamydia  Assessment and Plan  A: 1. Nausea/vomiting 2. Urinary Tract Infection 3. Vaginal Candidiasis 4. Trichomoniasis    P:  1.Zofran 8mg  ODT q8 hours PRN for nausea/vomiting and Ibuprofen 800 mg q8hours PRN for pain 2. Ciprofloxacin 500 mg PO q12 hour for 3 days 3.Diflucan 150 mg once before discharge 4. Metronidazole 2g PO orally once before discharge. Encouraged patient to have sexual partner treated at [facility] Department. Pt needed to leave and preferred to get medications to go home with Sabino Dick Sauve 08/21/2013, 5:23 PM

## 2013-08-22 ENCOUNTER — Telehealth: Payer: Self-pay | Admitting: *Deleted

## 2013-08-22 DIAGNOSIS — A749 Chlamydial infection, unspecified: Secondary | ICD-10-CM

## 2013-08-22 LAB — GC/CHLAMYDIA PROBE AMP
CT Probe RNA: POSITIVE — AB
GC Probe RNA: NEGATIVE

## 2013-08-22 LAB — URINE CULTURE
Colony Count: NO GROWTH
Culture: NO GROWTH

## 2013-08-22 MED ORDER — AZITHROMYCIN 250 MG PO TABS: 1000.0000 mg | ORAL_TABLET | Freq: Once | ORAL | Status: DC

## 2013-08-22 NOTE — Telephone Encounter (Signed)
Notes Recorded by Tarry Kos on 08/22/2013 at 11:48 AM Telephone call to patient regarding positive chlamydia culture, patient notified. Patient has not been treated and will need Rx called in per protocol to [facility]. Instructed patient to notify her partner for treatment. Report faxed to health department.  Rx sent to pharmacy per protocol.

## 2014-01-05 ENCOUNTER — Inpatient Hospital Stay (HOSPITAL_COMMUNITY): Payer: Self-pay

## 2014-01-05 ENCOUNTER — Inpatient Hospital Stay (HOSPITAL_COMMUNITY)
Admission: AD | Admit: 2014-01-05 | Discharge: 2014-01-05 | Disposition: A | Discharge status: Home / Self Care | Payer: Self-pay | Source: Ambulatory Visit | Attending: Family Medicine | Admitting: Family Medicine

## 2014-01-05 ENCOUNTER — Encounter (HOSPITAL_COMMUNITY): Payer: Self-pay | Admitting: *Deleted

## 2014-01-05 DIAGNOSIS — O26899 Other specified pregnancy related conditions, unspecified trimester: Secondary | ICD-10-CM

## 2014-01-05 DIAGNOSIS — F17210 Nicotine dependence, cigarettes, uncomplicated: Secondary | ICD-10-CM | POA: Insufficient documentation

## 2014-01-05 DIAGNOSIS — O99331 Smoking (tobacco) complicating pregnancy, first trimester: Secondary | ICD-10-CM | POA: Insufficient documentation

## 2014-01-05 DIAGNOSIS — O210 Mild hyperemesis gravidarum: Secondary | ICD-10-CM | POA: Insufficient documentation

## 2014-01-05 DIAGNOSIS — O219 Vomiting of pregnancy, unspecified: Secondary | ICD-10-CM

## 2014-01-05 DIAGNOSIS — Z3A10 10 weeks gestation of pregnancy: Secondary | ICD-10-CM | POA: Insufficient documentation

## 2014-01-05 DIAGNOSIS — R109 Unspecified abdominal pain: Secondary | ICD-10-CM | POA: Insufficient documentation

## 2014-01-05 DIAGNOSIS — O9989 Other specified diseases and conditions complicating pregnancy, childbirth and the puerperium: Secondary | ICD-10-CM

## 2014-01-05 LAB — CBC
HCT: 38.9 % (ref 36.0–46.0)
Hemoglobin: 13.3 g/dL (ref 12.0–15.0)
MCH: 29.2 pg (ref 26.0–34.0)
MCHC: 34.2 g/dL (ref 30.0–36.0)
MCV: 85.3 fL (ref 78.0–100.0)
PLATELETS: 248 10*3/uL (ref 150–400)
RBC: 4.56 MIL/uL (ref 3.87–5.11)
RDW: 12.9 % (ref 11.5–15.5)
WBC: 10.3 10*3/uL (ref 4.0–10.5)

## 2014-01-05 LAB — HCG, QUANTITATIVE, PREGNANCY: HCG, BETA CHAIN, QUANT, S: 275051 m[IU]/mL — AB (ref <5)

## 2014-01-05 LAB — WET PREP, GENITAL
CLUE CELLS WET PREP: NONE SEEN
Trich, Wet Prep: NONE SEEN
YEAST WET PREP: NONE SEEN

## 2014-01-05 MED ORDER — PROMETHAZINE HCL 25 MG PO TABS: 12.5000 mg | ORAL_TABLET | Freq: Four times a day (QID) | ORAL | Status: DC | PRN

## 2014-01-05 MED ORDER — PROMETHAZINE HCL 25 MG PO TABS
25.0000 mg | ORAL_TABLET | Freq: Four times a day (QID) | ORAL | Status: DC | PRN
  Administered 2014-01-05: 25 mg via ORAL
  Filled 2014-01-05: qty 1

## 2014-01-05 MED ORDER — AZITHROMYCIN 250 MG PO TABS
1000.0000 mg | ORAL_TABLET | ORAL | Status: AC
  Administered 2014-01-05: 1000 mg via ORAL
  Filled 2014-01-05: qty 4

## 2014-01-05 NOTE — MAU Provider Note (Signed)
Attestation of Attending Supervision of Advanced Practitioner (PA/CNM/NP): Evaluation and management procedures were performed by the Advanced Practitioner under my supervision and collaboration.  I have reviewed the Advanced Practitioner's note and chart, and I agree with the management and plan.  Donnamae Jude, MD [facility] Attending 01/05/2014 7:50 PM

## 2014-01-05 NOTE — Discharge Instructions (Signed)
Nausea medication to take during pregnancy:   Unisom (doxylamine succinate 25 mg tablets) Take one tablet daily at bedtime. If symptoms are not adequately controlled, the dose can be increased to a maximum recommended dose of two tablets daily (1/2 tablet in the morning, 1/2 tablet mid-afternoon and one at bedtime).  Vitamin B6 100mg  tablets. Take one tablet twice a day (up to 200 mg per day).  Add Phenergan as prescribed to take as needed.   Abdominal Pain During Pregnancy Abdominal pain is common in pregnancy. Most of the time, it does not cause harm. There are many causes of abdominal pain. Some causes are more serious than others. Some of the causes of abdominal pain in pregnancy are easily diagnosed. Occasionally, the diagnosis takes time to understand. Other times, the cause is not determined. Abdominal pain can be a sign that something is very wrong with the pregnancy, or the pain may have nothing to do with the pregnancy at all. For this reason, always tell your health care provider if you have any abdominal discomfort. HOME CARE INSTRUCTIONS  Monitor your abdominal pain for any changes. The following actions may help to alleviate any discomfort you are experiencing:  Do not have sexual intercourse or put anything in your vagina until your symptoms go away completely.  Get plenty of rest until your pain improves.  Drink clear fluids if you feel nauseous. Avoid solid food as long as you are uncomfortable or nauseous.  Only take over-the-counter or prescription medicine as directed by your health care provider.  Keep all follow-up appointments with your health care provider. SEEK IMMEDIATE MEDICAL CARE IF:  You are bleeding, leaking fluid, or passing tissue from the vagina.  You have increasing pain or cramping.  You have persistent vomiting.  You have painful or bloody urination.  You have a fever.  You notice a decrease in your baby's movements.  You have extreme weakness  or feel faint.  You have shortness of breath, with or without abdominal pain.  You develop a severe headache with abdominal pain.  You have abnormal vaginal discharge with abdominal pain.  You have persistent diarrhea.  You have abdominal pain that continues even after rest, or gets worse. MAKE SURE YOU:   Understand these instructions.  Will watch your condition.  Will get help right away if you are not doing well or get worse. Document Released: 03/13/2005 Document Revised: 01/01/2013 Document Reviewed: 10/10/2012 [facility] Patient Information 2015 [place], [place]. This information is not intended to replace advice given to you by your health care provider. Make sure you discuss any questions you have with your health care provider. Morning Sickness Morning sickness is when you feel sick to your stomach (nauseous) during pregnancy. This nauseous feeling may or may not come with vomiting. It often occurs in the morning but can be a problem any time of day. Morning sickness is most common during the first trimester, but it may continue throughout pregnancy. While morning sickness is unpleasant, it is usually harmless unless you develop severe and continual vomiting (hyperemesis gravidarum). This condition requires more intense treatment.  CAUSES  The cause of morning sickness is not completely known but seems to be related to normal hormonal changes that occur in pregnancy. RISK FACTORS You are at greater risk if you:  Experienced nausea or vomiting before your pregnancy.  Had morning sickness during a previous pregnancy.  Are pregnant with more than one baby, such as twins. TREATMENT  Do not use any medicines (prescription,  over-the-counter, or herbal) for morning sickness without first talking to your health care provider. Your health care provider may prescribe or recommend:  Vitamin B6 supplements.  Anti-nausea medicines.  The herbal medicine ginger. HOME CARE INSTRUCTIONS     Only take over-the-counter or prescription medicines as directed by your health care provider.  Taking multivitamins before getting pregnant can prevent or decrease the severity of morning sickness in most women.  Eat a piece of dry toast or unsalted crackers before getting out of bed in the morning.  Eat five or six small meals a day.  Eat dry and bland foods (rice, baked potato). Foods high in carbohydrates are often helpful.  Do not drink liquids with your meals. Drink liquids between meals.  Avoid greasy, fatty, and spicy foods.  Get someone to cook for you if the smell of any food causes nausea and vomiting.  If you feel nauseous after taking prenatal vitamins, take the vitamins at night or with a snack.  Snack on protein foods (nuts, yogurt, cheese) between meals if you are hungry.  Eat unsweetened gelatins for desserts.  Wearing an acupressure wristband (worn for sea sickness) may be helpful.  Acupuncture may be helpful.  Do not smoke.  Get a humidifier to keep the air in your house free of odors.  Get plenty of fresh air. SEEK MEDICAL CARE IF:   Your home remedies are not working, and you need medicine.  You feel dizzy or lightheaded.  You are losing weight. SEEK IMMEDIATE MEDICAL CARE IF:   You have persistent and uncontrolled nausea and vomiting.  You pass out (faint). MAKE SURE YOU:  Understand these instructions.  Will watch your condition.  Will get help right away if you are not doing well or get worse. Document Released: 05/04/2006 Document Revised: 03/18/2013 Document Reviewed: 08/28/2012 [facility] Patient Information 2015 [place], [place]. This information is not intended to replace advice given to you by your health care provider. Make sure you discuss any questions you have with your health care provider.

## 2014-01-05 NOTE — MAU Provider Note (Signed)
Chief Complaint: No chief complaint on file.   None    SUBJECTIVE HPI: Kylie [name] is a 23 y.o. [phone] at Unknown by LMP who presents to maternity admissions reporting positive pregnancy test at home this week, mild abdominal cramping, and nausea with daily vomiting.  She also reports she was positive for chlamydia from her last MAU visit in May but her Medicaid ran out and she was never treated.  She is unsure of LMP but is not sure she has had a period since July.  She denies vaginal bleeding, vaginal itching/burning, urinary symptoms, h/a, dizziness, n/v, or fever/chills.    Past Medical History  Diagnosis Date  . UTI (lower urinary tract infection)   . Chlamydia   . Headache(784.0)   . 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 Orif elbow fracture Right 12/02/2012    Procedure: OPEN REDUCTION INTERNAL FIXATION (ORIF) ELBOW/OLECRANON FRACTURE;  Surgeon: Schuyler Amor, MD;  Location: [place];  Service: Orthopedics;  Laterality: Right;   History   Social History  . Marital Status: Single    Spouse Name: N/A    Number of Children: N/A  . Years of Education: N/A   Occupational History  . Not on file.   Social History Main Topics  . Smoking status: Current Every Day Smoker -- 0.25 packs/day for 1 years    Types: Cigarettes  . Smokeless tobacco: Current User     Comment: electronic- e-sigs-mostly  . Alcohol Use: No     Comment: rarely  . Drug Use: No  . Sexual Activity: Yes    Birth Control/ Protection: None   Other Topics Concern  . Not on file   Social History Narrative  . No narrative on file   No current facility-administered medications on file prior to encounter.   No current outpatient prescriptions on file prior to encounter.   No Known Allergies  ROS: Pertinent items in HPI  OBJECTIVE Blood pressure 121/72, pulse 84, temperature 99.3 F (37.4 C), temperature source Oral, resp. rate 16, last  menstrual period 10/23/2013. GENERAL: Well-developed, well-nourished female in no acute distress.  HEENT: Normocephalic HEART: normal rate RESP: normal effort ABDOMEN: Soft, non-tender EXTREMITIES: Nontender, no edema NEURO: Alert and oriented Pelvic exam: Cervix pink, visually closed, without lesion, large amount thick yellow discharge, vaginal walls and external genitalia normal Bimanual exam: Cervix 0/long/high, firm, anterior, neg CMT, uterus nontender, enlarged, ~10 week size, adnexa without tenderness, enlargement, or mass  LAB RESULTS Results for orders placed during the hospital encounter of 01/05/14 (from the past 24 hour(s))  CBC     Status: None   Collection Time    01/05/14  5:39 PM      Result Value Ref Range   WBC 10.3  4.0 - 10.5 K/uL   RBC 4.56  3.87 - 5.11 MIL/uL   Hemoglobin 13.3  12.0 - 15.0 g/dL   HCT 38.9  36.0 - 46.0 %   MCV 85.3  78.0 - 100.0 fL   MCH 29.2  26.0 - 34.0 pg   MCHC 34.2  30.0 - 36.0 g/dL   RDW 12.9  11.5 - 15.5 %   Platelets 248  150 - 400 K/uL  HCG, QUANTITATIVE, PREGNANCY     Status: Abnormal   Collection Time    01/05/14  5:39 PM      Result Value Ref Range   hCG, Beta [employer] [place], S [id number] (*) <  5 mIU/mL  WET PREP, GENITAL     Status: Abnormal   Collection Time    01/05/14  5:40 PM      Result Value Ref Range   Yeast Wet Prep HPF POC NONE SEEN  NONE SEEN   Trich, Wet Prep NONE SEEN  NONE SEEN   Clue Cells Wet Prep HPF POC NONE SEEN  NONE SEEN   WBC, Wet Prep HPF POC MANY (*) NONE SEEN    IMAGING US Ob Comp Less 14 Wks  01/05/2014   CLINICAL DATA:  Pregnant patient with abdominal pain for 2 days. Quantitative HCG 275,051.  EXAM: OBSTETRIC <14 WK ULTRASOUND  TECHNIQUE: Transabdominal ultrasound was performed for evaluation of the gestation as well as the maternal uterus and adnexal regions.  COMPARISON:  None.  FINDINGS: Intrauterine gestational sac: Visualized/normal in shape.  Yolk sac:  Visualized.  Embryo:  Visualized.  Cardiac  Activity: Detected.  Heart Rate: 158 bpm  CRL:   34.8  mm   10 w 3 d                  [place] EDC: 07/31/2014.  Maternal uterus/adnexae: Unremarkable.  IMPRESSION: Single living intrauterine pregnancy. No acute finding. Followup full fetal survey is recommended.   Electronically Signed   By: Inge Rise M.D.   On: 01/05/2014 19:28    ASSESSMENT 1. Abdominal pain affecting pregnancy   2. Nausea and vomiting during pregnancy prior to [redacted] weeks gestation     PLAN Azithromycin 1000 mg PO x 1 dose in MAU.  Phenergan 25 mg PO given for nausea. Rx for chlamydia treatment for pt partner.  Reviewed health hx.  NKDA. Discharge home Phenergan 12.5-25 mg PO Q 6 hours PRN nausea Discussed OTC Unisom/B6 for nausea F/U with early prenatal care Return to MAU as needed for emergencies  Follow-up Information   Please follow up. (with prenatal provider of your choice)       Follow up with [facility]. (As needed for emergencies)    Contact information:   [address] [id number] [place] [address] [phone]      Fatima Blank Certified Nurse-Midwife 01/05/2014  7:43 PM

## 2014-01-06 LAB — HIV ANTIBODY (ROUTINE TESTING W REFLEX): HIV 1&2 Ab, 4th Generation: NONREACTIVE

## 2014-01-06 LAB — GC/CHLAMYDIA PROBE AMP
CT Probe RNA: NEGATIVE
GC Probe RNA: NEGATIVE

## 2014-01-26 ENCOUNTER — Encounter (HOSPITAL_COMMUNITY): Payer: Self-pay | Admitting: *Deleted

## 2014-03-04 ENCOUNTER — Inpatient Hospital Stay (HOSPITAL_COMMUNITY)
Admission: AD | Admit: 2014-03-04 | Discharge: 2014-03-04 | Disposition: A | Discharge status: Home / Self Care | Payer: Self-pay | Source: Ambulatory Visit | Attending: Obstetrics & Gynecology | Admitting: Obstetrics & Gynecology

## 2014-03-04 ENCOUNTER — Encounter (HOSPITAL_COMMUNITY): Payer: Self-pay | Admitting: General Practice

## 2014-03-04 ENCOUNTER — Telehealth: Payer: Self-pay

## 2014-03-04 DIAGNOSIS — O9989 Other specified diseases and conditions complicating pregnancy, childbirth and the puerperium: Secondary | ICD-10-CM

## 2014-03-04 DIAGNOSIS — N949 Unspecified condition associated with female genital organs and menstrual cycle: Secondary | ICD-10-CM

## 2014-03-04 DIAGNOSIS — O98812 Other maternal infectious and parasitic diseases complicating pregnancy, second trimester: Secondary | ICD-10-CM | POA: Insufficient documentation

## 2014-03-04 DIAGNOSIS — O99332 Smoking (tobacco) complicating pregnancy, second trimester: Secondary | ICD-10-CM | POA: Insufficient documentation

## 2014-03-04 DIAGNOSIS — R102 Pelvic and perineal pain: Secondary | ICD-10-CM

## 2014-03-04 DIAGNOSIS — Z3A18 18 weeks gestation of pregnancy: Secondary | ICD-10-CM | POA: Insufficient documentation

## 2014-03-04 DIAGNOSIS — O26899 Other specified pregnancy related conditions, unspecified trimester: Secondary | ICD-10-CM

## 2014-03-04 DIAGNOSIS — R109 Unspecified abdominal pain: Secondary | ICD-10-CM

## 2014-03-04 DIAGNOSIS — B3731 Acute candidiasis of vulva and vagina: Secondary | ICD-10-CM

## 2014-03-04 DIAGNOSIS — F17210 Nicotine dependence, cigarettes, uncomplicated: Secondary | ICD-10-CM | POA: Insufficient documentation

## 2014-03-04 DIAGNOSIS — O0932 Supervision of pregnancy with insufficient antenatal care, second trimester: Secondary | ICD-10-CM

## 2014-03-04 DIAGNOSIS — B373 Candidiasis of vulva and vagina: Secondary | ICD-10-CM | POA: Insufficient documentation

## 2014-03-04 LAB — URINALYSIS, ROUTINE W REFLEX MICROSCOPIC
Bilirubin Urine: NEGATIVE
GLUCOSE, UA: NEGATIVE mg/dL
Hgb urine dipstick: NEGATIVE
KETONES UR: NEGATIVE mg/dL
Nitrite: NEGATIVE
PH: 6 (ref 5.0–8.0)
Protein, ur: NEGATIVE mg/dL
SPECIFIC GRAVITY, URINE: 1.02 (ref 1.005–1.030)
Urobilinogen, UA: 0.2 mg/dL (ref 0.0–1.0)

## 2014-03-04 LAB — URINE MICROSCOPIC-ADD ON

## 2014-03-04 LAB — WET PREP, GENITAL
Clue Cells Wet Prep HPF POC: NONE SEEN
TRICH WET PREP: NONE SEEN

## 2014-03-04 LAB — HIV ANTIBODY (ROUTINE TESTING W REFLEX): HIV: NONREACTIVE

## 2014-03-04 MED ORDER — FLUCONAZOLE 150 MG PO TABS: 150.0000 mg | ORAL_TABLET | Freq: Every day | ORAL | Status: DC

## 2014-03-04 NOTE — MAU Note (Signed)
Began spotting yesterday, has been cramping for several days.  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  Has not seen any blood today, still having cramping & pressure.  Denies recent intercourse.

## 2014-03-04 NOTE — Discharge Instructions (Signed)

## 2014-03-04 NOTE — Telephone Encounter (Signed)
U/S scheduled for 03/10/14 at 1500. Called patient and informed her of appointment date, time and location. Informed patient she will receive a call from front office staff with clinic appointment. Patient verbalized understanding and gratitude. No questions or concerns.

## 2014-03-04 NOTE — Telephone Encounter (Signed)
-----   Message from Mallie Darting sent at 03/04/2014  2:31 PM EST ----- Please advise ----- Message -----    From: Ronnald Ramp, NP    Sent: 03/04/2014  12:58 PM      To: Curly [place], RN, [facility] Admin Pool  [redacted]w[redacted]d No prenatal care.  Low Risk.  OB complete US ordered

## 2014-03-04 NOTE — MAU Provider Note (Signed)
History     CSN: [id number]  Arrival date and time: 03/04/14 8937   First Provider Initiated Contact with Patient 03/04/14 1155      Chief Complaint  Patient presents with  . Abdominal Pain   HPI   [name] is a 23 y.o. female [phone] at [redacted]w[redacted]d who presents with abdominal pain/ pressure and vaginal spotting (yesterday only) she currently denies spotting or bleeding. She has not started prenatal care due to insurance (has not applied for medicaid). No recent intercourse.     OB History    Gravida Para Term Preterm AB TAB SAB Ectopic Multiple Living   6 3 3  0 2 0 2 0 0 3      Past Medical History  Diagnosis Date  . UTI (lower urinary tract infection)   . Chlamydia   . Headache(784.0)   . 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 Orif elbow fracture Right 12/02/2012    Procedure: OPEN REDUCTION INTERNAL FIXATION (ORIF) ELBOW/OLECRANON FRACTURE;  Surgeon: Schuyler Amor, MD;  Location: [place];  Service: Orthopedics;  Laterality: Right;    Family History  Problem Relation Age of Onset  . Hyperlipidemia Father   . Hypertension Father   . Cancer Paternal Grandmother   . Kidney disease Paternal Grandmother   . Diabetes Paternal Grandfather   . Cancer Paternal Aunt   . Hearing loss Neg Hx     History  Substance Use Topics  . Smoking status: Current Every Day Smoker -- 0.25 packs/day for 1 years    Types: Cigarettes  . Smokeless tobacco: Current User     Comment: electronic- e-sigs-mostly  . Alcohol Use: No     Comment: rarely    Allergies: No Known Allergies  Prescriptions prior to admission  Medication Sig Dispense Refill Last Dose  . acetaminophen (TYLENOL) 500 MG tablet Take 1,000 mg by mouth every 6 (six) hours as needed for mild pain or headache.   Past Week at Unknown time  . Prenatal Vit-Fe Fumarate-FA (PRENATAL MULTIVITAMIN) TABS tablet Take 1 tablet by mouth daily at 12 noon.    03/03/2014 at Unknown time  . pyridOXINE (VITAMIN B-6) 100 MG tablet Take 100 mg by mouth daily.   03/03/2014 at Unknown time  . promethazine (PHENERGAN) 25 MG tablet Take 0.5-1 tablets (12.5-25 mg total) by mouth every 6 (six) hours as needed for nausea or vomiting. (Patient not taking: Reported on 03/04/2014) 30 tablet 2    Results for orders placed or performed during the hospital encounter of 03/04/14 (from the past 48 hour(s))  Urinalysis, Routine w reflex microscopic     Status: Abnormal   Collection Time: 03/04/14 10:18 AM  Result Value Ref Range   Color, Urine YELLOW YELLOW   APPearance CLEAR CLEAR   Specific Gravity, Urine 1.020 1.005 - 1.030   pH 6.0 5.0 - 8.0   Glucose, UA NEGATIVE NEGATIVE mg/dL   Hgb urine dipstick NEGATIVE NEGATIVE   Bilirubin Urine NEGATIVE NEGATIVE   Ketones, ur NEGATIVE NEGATIVE mg/dL   Protein, ur NEGATIVE NEGATIVE mg/dL   Urobilinogen, UA 0.2 0.0 - 1.0 mg/dL   Nitrite NEGATIVE NEGATIVE   Leukocytes, UA SMALL (A) NEGATIVE  Urine microscopic-add on     Status: None   Collection Time: 03/04/14 10:18 AM  Result Value Ref Range   Squamous Epithelial / LPF RARE RARE   WBC, UA 11-20 <3 WBC/hpf  Bacteria, UA RARE RARE  Wet prep, genital     Status: Abnormal   Collection Time: 03/04/14 12:13 PM  Result Value Ref Range   Yeast Wet Prep HPF POC FEW (A) NONE SEEN   Trich, Wet Prep NONE SEEN NONE SEEN   Clue Cells Wet Prep HPF POC NONE SEEN NONE SEEN   WBC, Wet Prep HPF POC MODERATE (A) NONE SEEN    Comment: MODERATE BACTERIA SEEN    Review of Systems  Constitutional: Negative for fever and chills.  Gastrointestinal: Positive for nausea and abdominal pain (Bilateral lower abdominal cramping ). Negative for vomiting, diarrhea and constipation.  Genitourinary: Negative for dysuria, urgency and frequency.  Musculoskeletal: Positive for back pain.   Physical Exam   Blood pressure 116/62, pulse 93, temperature 98.4 F (36.9 C), temperature source Oral,  resp. rate 16, last menstrual period 10/23/2013, SpO2 99 %.  Physical Exam  Constitutional: She is oriented to person, place, and time. She appears well-developed and well-nourished. No distress.  HENT:  Head: Normocephalic.  Eyes: Pupils are equal, round, and reactive to light.  Neck: Neck supple.  Respiratory: Effort normal.  GI: Soft. Normal appearance. There is tenderness in the suprapubic area. There is rigidity. There is no rebound and no guarding.  Genitourinary: Vaginal discharge found.  Speculum exam: Vagina - Moderate amount of creamy, white discharge, no odor Cervix - No contact bleeding, no active bleeding  Bimanual exam: Cervix closed, posterior  Uterus is enlarged Adnexa non tender, no masses bilaterally GC/Chlam, wet prep done Chaperone present for exam.   Musculoskeletal: Normal range of motion.  Neurological: She is alert and oriented to person, place, and time.  Skin: Skin is warm. She is not diaphoretic.  Psychiatric: Her behavior is normal.    MAU Course  Procedures  None  MDM  +fetal heart tones via doppler  Wet prep GC HIV  UA   Assessment and Plan   A: 1. Yeast vaginitis   2. Round ligament pain   3. No prenatal care in current pregnancy, second trimester   4. Abdominal pain in pregnancy    P: Discharge home in stable condition RX: Diflucan  Pregnancy support belt recommended Referral to clinic Out patient [place] ordered; radiology to call the patient to schedule  Pregnancy verification letter provided   Darrelyn Hillock Okie Bogacz, NP 03/04/2014 1:26 PM

## 2014-03-05 LAB — GC/CHLAMYDIA PROBE AMP
CT Probe RNA: NEGATIVE
GC PROBE AMP APTIMA: NEGATIVE

## 2014-03-10 ENCOUNTER — Ambulatory Visit (HOSPITAL_COMMUNITY)
Admission: RE | Admit: 2014-03-10 | Discharge: 2014-03-10 | Disposition: A | Discharge status: Home / Self Care | Payer: Self-pay | Source: Ambulatory Visit | Attending: Obstetrics and Gynecology | Admitting: Obstetrics and Gynecology

## 2014-03-10 DIAGNOSIS — R109 Unspecified abdominal pain: Secondary | ICD-10-CM

## 2014-03-10 DIAGNOSIS — Z3A19 19 weeks gestation of pregnancy: Secondary | ICD-10-CM | POA: Insufficient documentation

## 2014-03-10 DIAGNOSIS — Z36 Encounter for antenatal screening of mother: Secondary | ICD-10-CM | POA: Insufficient documentation

## 2014-03-10 DIAGNOSIS — O0932 Supervision of pregnancy with insufficient antenatal care, second trimester: Secondary | ICD-10-CM

## 2014-03-10 DIAGNOSIS — O26899 Other specified pregnancy related conditions, unspecified trimester: Secondary | ICD-10-CM

## 2014-03-10 DIAGNOSIS — O0930 Supervision of pregnancy with insufficient antenatal care, unspecified trimester: Secondary | ICD-10-CM | POA: Insufficient documentation

## 2014-03-11 ENCOUNTER — Telehealth: Payer: Self-pay

## 2014-03-11 NOTE — Telephone Encounter (Signed)
-----   Message from [facility] sent at 03/10/2014  4:30 PM EST ----- Regarding: Ultrasound Results We have just completed a 2nd or 3rd trimester outpatient ultrasound scheduled for a patient who was seen in MAU.  Please call the patient with the results.

## 2014-03-11 NOTE — Telephone Encounter (Signed)
Called patient and informed her of U/S results. Patient states she is going to be coming to the clinic but has not been called with an appointment yet. Appt scheduled for 03/13/14. Patient states she has court this day and cannot make it. Informed her I will have front office staff call her with rescheduled appointment. Message sent to admin pool to call her with rescheduled appointment.

## 2014-03-13 ENCOUNTER — Encounter: Payer: Self-pay | Admitting: Advanced Practice Midwife

## 2014-03-16 ENCOUNTER — Encounter: Payer: Self-pay | Admitting: Family Medicine

## 2014-03-27 NOTE — L&D Delivery Note (Cosign Needed)
Delivery Note At 6:37 PM a viable female was delivered via Vaginal, Spontaneous Delivery (Presentation: Right Occiput Anterior).  APGAR: 8, 9; weight pending .   Placenta status: Intact, Spontaneous.  Cord: 3 vessels with the following complications: None.    Anesthesia: Epidural  Episiotomy: None Lacerations: None Suture Repair: n/a Est. Blood Loss (mL): 140  Mom to postpartum.  Baby to Couplet care / Skin to Skin.  Seanpaul Preece 07/31/2014, 7:09 PM

## 2014-04-07 ENCOUNTER — Encounter: Payer: Self-pay | Admitting: General Practice

## 2014-07-07 ENCOUNTER — Encounter (HOSPITAL_COMMUNITY): Payer: Self-pay | Admitting: *Deleted

## 2014-07-07 ENCOUNTER — Other Ambulatory Visit: Payer: Self-pay

## 2014-07-07 ENCOUNTER — Inpatient Hospital Stay (HOSPITAL_COMMUNITY)
Admission: EM | Admit: 2014-07-07 | Discharge: 2014-07-07 | Disposition: A | Discharge status: Home / Self Care | Payer: Medicaid Other | Source: Ambulatory Visit | Attending: Family Medicine | Admitting: Family Medicine

## 2014-07-07 DIAGNOSIS — O99333 Smoking (tobacco) complicating pregnancy, third trimester: Secondary | ICD-10-CM | POA: Diagnosis not present

## 2014-07-07 DIAGNOSIS — O4703 False labor before 37 completed weeks of gestation, third trimester: Secondary | ICD-10-CM

## 2014-07-07 DIAGNOSIS — O99820 Streptococcus B carrier state complicating pregnancy: Secondary | ICD-10-CM

## 2014-07-07 DIAGNOSIS — F17210 Nicotine dependence, cigarettes, uncomplicated: Secondary | ICD-10-CM | POA: Insufficient documentation

## 2014-07-07 DIAGNOSIS — Z3A36 36 weeks gestation of pregnancy: Secondary | ICD-10-CM | POA: Diagnosis not present

## 2014-07-07 DIAGNOSIS — Z3493 Encounter for supervision of normal pregnancy, unspecified, third trimester: Secondary | ICD-10-CM

## 2014-07-07 LAB — URINE MICROSCOPIC-ADD ON

## 2014-07-07 LAB — URINALYSIS, ROUTINE W REFLEX MICROSCOPIC
BILIRUBIN URINE: NEGATIVE
GLUCOSE, UA: NEGATIVE mg/dL
Ketones, ur: NEGATIVE mg/dL
Nitrite: NEGATIVE
PROTEIN: NEGATIVE mg/dL
Specific Gravity, Urine: 1.015 (ref 1.005–1.030)
UROBILINOGEN UA: 0.2 mg/dL (ref 0.0–1.0)
pH: 7 (ref 5.0–8.0)

## 2014-07-07 LAB — OB RESULTS CONSOLE GBS: STREP GROUP B AG: POSITIVE

## 2014-07-07 NOTE — Progress Notes (Signed)
Dr Deniece Ree in with d/c papers. Written and verbal d/c instructions given and understanding voiced. To go to clinic now for labs and they will set up appt. Pt agrees. GBS swab today

## 2014-07-07 NOTE — MAU Note (Signed)
Having a lot pelvic pressure, yesterday was having a lot of braxton hicks- not today.  Didn't sleep well because of pain.  Hands have been going numb

## 2014-07-07 NOTE — Discharge Instructions (Signed)
Go to clinic to get your prenatal labs done, they will tell your initial clinic appointment.

## 2014-07-07 NOTE — MAU Provider Note (Signed)
History     CSN: [id number]  Arrival date and time: 07/07/14 1030   First Provider Initiated Contact with Patient 07/07/14 1139      Chief Complaint  Patient presents with  . Numbness  . Pelvic Pain   HPI  Patient is 24 y.o. [id number] [redacted]w[redacted]d here with complaints of contractions which she thinks are braxton hicks.  They are infrequent and not very painful.  Patient has not established care anywhere yet.  +FM, denies LOF, VB, vaginal discharge.     Past Medical History  Diagnosis Date  . UTI (lower urinary tract infection)   . Chlamydia   . Headache(784.0)   . 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 Orif elbow fracture Right 12/02/2012    Procedure: OPEN REDUCTION INTERNAL FIXATION (ORIF) ELBOW/OLECRANON FRACTURE;  Surgeon: Schuyler Amor, MD;  Location: [place];  Service: Orthopedics;  Laterality: Right;    Family History  Problem Relation Age of Onset  . Hyperlipidemia Father   . Hypertension Father   . Cancer Paternal Grandmother   . Kidney disease Paternal Grandmother   . Diabetes Paternal Grandfather   . Cancer Paternal Aunt   . Hearing loss Neg Hx     History  Substance Use Topics  . Smoking status: Current Every Day Smoker -- 0.25 packs/day for 1 years    Types: Cigarettes  . Smokeless tobacco: Current User     Comment: electronic- e-sigs-mostly  . Alcohol Use: No     Comment: rarely    Allergies: No Known Allergies  Prescriptions prior to admission  Medication Sig Dispense Refill Last Dose  . acetaminophen (TYLENOL) 500 MG tablet Take 1,000 mg by mouth every 6 (six) hours as needed for mild pain or headache.   Past Week at Unknown time  . Prenatal Vit-Fe Fumarate-FA (PRENATAL MULTIVITAMIN) TABS tablet Take 1 tablet by mouth daily at 12 noon.   Past Month at Unknown time  . fluconazole (DIFLUCAN) 150 MG tablet Take 1 tablet (150 mg total) by mouth daily. (Patient not taking: Reported on  07/07/2014) 1 tablet 0   . promethazine (PHENERGAN) 25 MG tablet Take 0.5-1 tablets (12.5-25 mg total) by mouth every 6 (six) hours as needed for nausea or vomiting. (Patient not taking: Reported on 07/07/2014) 30 tablet 2     Review of Systems  Gastrointestinal:       + contractions   Physical Exam   Blood pressure 124/69, pulse 107, temperature 98.3 F (36.8 C), temperature source Oral, resp. rate 16, height 5' 3.5" (1.613 m), weight 208 lb (94.348 kg), last menstrual period 10/23/2013.  Physical Exam  Constitutional: She is oriented to person, place, and time. She appears well-developed and well-nourished.  HENT:  Head: Normocephalic and atraumatic.  Eyes: Conjunctivae and EOM are normal.  Neck: Normal range of motion.  Cardiovascular: Normal rate.   Respiratory: Effort normal. No respiratory distress.  GI: Soft. Bowel sounds are normal. She exhibits no distension. There is no tenderness.  Genitourinary: Vagina normal.  Musculoskeletal: Normal range of motion. She exhibits no edema.  Neurological: She is alert and oriented to person, place, and time.  Skin: Skin is warm and dry. No erythema.   2/40/-2  MAU Course  Procedures  MDM NST reactive  Assessment and Plan  Patient is 24 y.o. [id number] [redacted]w[redacted]d reporting contractions likely secondary to early latent labor vs braxton hicks - fetal kick counts reinforced -  preterm labor precautions - GBS cx today - referred to clinic to establish care, to go down now for labs to be drawn in clinic - 19w sono reviewed, no indication for additional sono at this time.   Jackson Fetters ROCIO 07/07/2014, 12:06 PM

## 2014-07-07 NOTE — Progress Notes (Signed)
Dr Elodia Florence on unit and aware of pt's admission and status. Will see pt

## 2014-07-08 LAB — PRENATAL PROFILE (SOLSTAS)
Antibody Screen: NEGATIVE
BASOS ABS: 0 10*3/uL (ref 0.0–0.1)
Basophils Relative: 0 % (ref 0–1)
Eosinophils Absolute: 0.1 10*3/uL (ref 0.0–0.7)
Eosinophils Relative: 1 % (ref 0–5)
HCT: 31.8 % — ABNORMAL LOW (ref 36.0–46.0)
HEP B S AG: NEGATIVE
HIV 1&2 Ab, 4th Generation: NONREACTIVE
Hemoglobin: 10.8 g/dL — ABNORMAL LOW (ref 12.0–15.0)
Lymphocytes Relative: 19 % (ref 12–46)
Lymphs Abs: 2.8 10*3/uL (ref 0.7–4.0)
MCH: 27.8 pg (ref 26.0–34.0)
MCHC: 34 g/dL (ref 30.0–36.0)
MCV: 82 fL (ref 78.0–100.0)
MPV: 10.9 fL (ref 8.6–12.4)
Monocytes Absolute: 0.9 10*3/uL (ref 0.1–1.0)
Monocytes Relative: 6 % (ref 3–12)
NEUTROS PCT: 74 % (ref 43–77)
Neutro Abs: 11 10*3/uL — ABNORMAL HIGH (ref 1.7–7.7)
Platelets: 251 10*3/uL (ref 150–400)
RBC: 3.88 MIL/uL (ref 3.87–5.11)
RDW: 14.1 % (ref 11.5–15.5)
Rh Type: POSITIVE
Rubella: 6.93 Index — ABNORMAL HIGH (ref <0.90)
WBC: 14.9 10*3/uL — ABNORMAL HIGH (ref 4.0–10.5)

## 2014-07-08 LAB — CULTURE, BETA STREP (GROUP B ONLY)

## 2014-07-09 DIAGNOSIS — O99820 Streptococcus B carrier state complicating pregnancy: Secondary | ICD-10-CM

## 2014-07-13 ENCOUNTER — Encounter (HOSPITAL_COMMUNITY): Payer: Self-pay | Admitting: *Deleted

## 2014-07-13 ENCOUNTER — Inpatient Hospital Stay (HOSPITAL_COMMUNITY)
Admission: AD | Admit: 2014-07-13 | Discharge: 2014-07-13 | Disposition: A | Discharge status: Home / Self Care | Payer: Medicaid Other | Source: Ambulatory Visit | Attending: Obstetrics and Gynecology | Admitting: Obstetrics and Gynecology

## 2014-07-13 DIAGNOSIS — O9989 Other specified diseases and conditions complicating pregnancy, childbirth and the puerperium: Secondary | ICD-10-CM | POA: Insufficient documentation

## 2014-07-13 DIAGNOSIS — Z3A37 37 weeks gestation of pregnancy: Secondary | ICD-10-CM | POA: Insufficient documentation

## 2014-07-13 DIAGNOSIS — O99820 Streptococcus B carrier state complicating pregnancy: Secondary | ICD-10-CM

## 2014-07-13 DIAGNOSIS — M549 Dorsalgia, unspecified: Secondary | ICD-10-CM | POA: Insufficient documentation

## 2014-07-13 NOTE — MAU Note (Signed)
C/O of back pain intermittently  Denies bright red vaginal bleeding.  Denies bloody show, clear mucous discharge only Positive/Decreased/Absent) fetal movement.  Denies SROM/LOF  Denies any Complications of pregnancy  GBS unknown per patient

## 2014-07-13 NOTE — MAU Note (Signed)
An After Visit Summary was printed and given to the patient. Patient verbalizes understanding.

## 2014-07-16 ENCOUNTER — Ambulatory Visit (INDEPENDENT_AMBULATORY_CARE_PROVIDER_SITE_OTHER): Payer: Self-pay | Admitting: Obstetrics & Gynecology

## 2014-07-16 ENCOUNTER — Encounter: Payer: Self-pay | Admitting: Obstetrics & Gynecology

## 2014-07-16 VITALS — BP 124/74 | HR 86 | Ht 65.0 in | Wt 208.8 lb

## 2014-07-16 DIAGNOSIS — O0933 Supervision of pregnancy with insufficient antenatal care, third trimester: Secondary | ICD-10-CM

## 2014-07-16 DIAGNOSIS — O0930 Supervision of pregnancy with insufficient antenatal care, unspecified trimester: Secondary | ICD-10-CM

## 2014-07-16 DIAGNOSIS — Z23 Encounter for immunization: Secondary | ICD-10-CM

## 2014-07-16 LAB — POCT URINALYSIS DIP (DEVICE)
BILIRUBIN URINE: NEGATIVE
GLUCOSE, UA: NEGATIVE mg/dL
Hgb urine dipstick: NEGATIVE
Ketones, ur: NEGATIVE mg/dL
NITRITE: NEGATIVE
PH: 7 (ref 5.0–8.0)
Protein, ur: NEGATIVE mg/dL
Specific Gravity, Urine: 1.02 (ref 1.005–1.030)
Urobilinogen, UA: 0.2 mg/dL (ref 0.0–1.0)

## 2014-07-16 LAB — OB RESULTS CONSOLE GC/CHLAMYDIA
CHLAMYDIA, DNA PROBE: NEGATIVE
Gonorrhea: NEGATIVE

## 2014-07-16 MED ORDER — TETANUS-DIPHTH-ACELL PERTUSSIS 5-2.5-18.5 LF-MCG/0.5 IM SUSP: 0.5000 mL | Freq: Once | INTRAMUSCULAR | Status: DC

## 2014-07-16 NOTE — Progress Notes (Signed)
Back pain today. Plans to bottle feed.

## 2014-07-16 NOTE — Progress Notes (Signed)
New OB late to care, has good dating LMP = 10 week [place]  Subjective: new OB    Kylie [name] is a [id number] [redacted]w[redacted]d being seen today for her first obstetrical visit.  Her obstetrical history is significant for limited PNC. Patient does not intend to breast feed. Pregnancy history fully reviewed.  Patient reports no complaints.  Filed Vitals:   07/16/14 0806 07/16/14 0810  BP: 124/74   Pulse: 86   Height:  5\' 5"  (1.651 m)  Weight: 208 lb 12.8 oz (94.711 kg)     HISTORY: OB History  Gravida Para Term Preterm AB SAB TAB Ectopic Multiple Living  6 3 3  0 2 2 0 0 0 3    # Outcome Date GA Lbr Len/2nd Weight Sex Delivery Anes PTL Lv  6 Current           5 SAB 10/23/12 [redacted]w[redacted]d         4 SAB 10/15/11 [redacted]w[redacted]d         3 Term 01/03/11 [redacted]w[redacted]d  9 lb 7 oz (4.281 kg) M Vag-Spont EPI  Y     Comments: No complications  2 Term 27/25/36 [redacted]w[redacted]d  6 lb 10 oz (3.005 kg) F Vag-Spont EPI  Y     Comments: No complications  1 Term 64/40/34 [redacted]w[redacted]d  7 lb 11 oz (3.487 kg) M Vag-Spont EPI  Y     Comments: No complications     Past Medical History  Diagnosis Date  . UTI (lower urinary tract infection)   . Chlamydia   . Headache(784.0)   . 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 Orif elbow fracture Right 12/02/2012    Procedure: OPEN REDUCTION INTERNAL FIXATION (ORIF) ELBOW/OLECRANON FRACTURE;  Surgeon: Schuyler Amor, MD;  Location: [place];  Service: Orthopedics;  Laterality: Right;   Family History  Problem Relation Age of Onset  . Hyperlipidemia Father   . Hypertension Father   . Cancer Paternal Grandmother   . Kidney disease Paternal Grandmother   . Diabetes Paternal Grandfather   . Cancer Paternal Aunt   . Hearing loss Neg Hx      Exam    Uterus:  Fundal Height: 38 cm  Pelvic Exam:                                    Skin: normal coloration and turgor, no rashes    Neurologic: oriented, normal mood   Extremities: normal  strength, tone, and muscle mass   HEENT PERRLA   Mouth/Teeth dental hygiene good           Respiratory:  appears well, vitals normal, no respiratory distress, acyanotic, normal RR   Abdomen: gravid   Urinary:        Assessment:    Pregnancy: [id number] Patient Active Problem List   Diagnosis Date Noted  . Limited prenatal care, antepartum 07/16/2014  . GBS (group B Streptococcus carrier), +RV culture, currently pregnant 07/09/2014        Plan:     Initial labs drawn. Prenatal vitamins. Problem list reviewed and updated. Genetic Screening discussed no  Ultrasound discussed; fetal survey: results reviewed.  Follow up in 1 weeks. 50% of 30 min visit spent on counseling and coordination of care.     ARNOLD,JAMES 07/16/2014

## 2014-07-16 NOTE — Progress Notes (Signed)
Moderate leuks on UA.

## 2014-07-17 LAB — GLUCOSE TOLERANCE, 1 HOUR (50G) W/O FASTING: Glucose, 1 Hour GTT: 141 mg/dL — ABNORMAL HIGH (ref 70–140)

## 2014-07-17 LAB — GC/CHLAMYDIA PROBE AMP
CT Probe RNA: NEGATIVE
GC PROBE AMP APTIMA: NEGATIVE

## 2014-07-18 ENCOUNTER — Encounter (HOSPITAL_COMMUNITY): Payer: Self-pay | Admitting: *Deleted

## 2014-07-18 ENCOUNTER — Inpatient Hospital Stay (HOSPITAL_COMMUNITY)
Admission: AD | Admit: 2014-07-18 | Discharge: 2014-07-18 | Disposition: A | Discharge status: Home / Self Care | Payer: Medicaid Other | Source: Ambulatory Visit | Attending: Family Medicine | Admitting: Family Medicine

## 2014-07-18 DIAGNOSIS — N898 Other specified noninflammatory disorders of vagina: Secondary | ICD-10-CM | POA: Diagnosis not present

## 2014-07-18 DIAGNOSIS — O479 False labor, unspecified: Secondary | ICD-10-CM

## 2014-07-18 DIAGNOSIS — F17210 Nicotine dependence, cigarettes, uncomplicated: Secondary | ICD-10-CM | POA: Diagnosis not present

## 2014-07-18 DIAGNOSIS — O9989 Other specified diseases and conditions complicating pregnancy, childbirth and the puerperium: Secondary | ICD-10-CM | POA: Insufficient documentation

## 2014-07-18 DIAGNOSIS — Z3A38 38 weeks gestation of pregnancy: Secondary | ICD-10-CM | POA: Diagnosis not present

## 2014-07-18 DIAGNOSIS — O0933 Supervision of pregnancy with insufficient antenatal care, third trimester: Secondary | ICD-10-CM | POA: Insufficient documentation

## 2014-07-18 DIAGNOSIS — O99333 Smoking (tobacco) complicating pregnancy, third trimester: Secondary | ICD-10-CM | POA: Insufficient documentation

## 2014-07-18 DIAGNOSIS — B373 Candidiasis of vulva and vagina: Secondary | ICD-10-CM

## 2014-07-18 DIAGNOSIS — R102 Pelvic and perineal pain: Secondary | ICD-10-CM | POA: Insufficient documentation

## 2014-07-18 DIAGNOSIS — Z3A39 39 weeks gestation of pregnancy: Secondary | ICD-10-CM

## 2014-07-18 DIAGNOSIS — B3731 Acute candidiasis of vulva and vagina: Secondary | ICD-10-CM

## 2014-07-18 DIAGNOSIS — O26893 Other specified pregnancy related conditions, third trimester: Secondary | ICD-10-CM

## 2014-07-18 DIAGNOSIS — O471 False labor at or after 37 completed weeks of gestation: Secondary | ICD-10-CM

## 2014-07-18 LAB — WET PREP, GENITAL
Clue Cells Wet Prep HPF POC: NONE SEEN
Trich, Wet Prep: NONE SEEN

## 2014-07-18 LAB — POCT FERN TEST: POCT Fern Test: NEGATIVE

## 2014-07-18 MED ORDER — MICONAZOLE NITRATE 100 MG VA SUPP: 100.0000 mg | Freq: Every day | VAGINAL | Status: DC

## 2014-07-18 NOTE — Progress Notes (Signed)
Dr Deniece Ree

## 2014-07-18 NOTE — Discharge Instructions (Signed)
Braxton Hicks Contractions Contractions of the uterus can occur throughout pregnancy. Contractions are not always a sign that you are in labor.  WHAT ARE BRAXTON HICKS CONTRACTIONS?  Contractions that occur before labor are called Braxton Hicks contractions, or false labor. Toward the end of pregnancy (32-34 weeks), these contractions can develop more often and may become more forceful. This is not true labor because these contractions do not result in opening (dilatation) and thinning of the cervix. They are sometimes difficult to tell apart from true labor because these contractions can be forceful and people have different pain tolerances. You should not feel embarrassed if you go to the hospital with false labor. Sometimes, the only way to tell if you are in true labor is for your health care provider to look for changes in the cervix. If there are no prenatal problems or other health problems associated with the pregnancy, it is completely safe to be sent home with false labor and await the onset of true labor. HOW CAN YOU TELL THE DIFFERENCE BETWEEN TRUE AND FALSE LABOR? False Labor  The contractions of false labor are usually shorter and not as hard as those of true labor.   The contractions are usually irregular.   The contractions are often felt in the front of the lower abdomen and in the groin.   The contractions may go away when you walk around or change positions while lying down.   The contractions get weaker and are shorter lasting as time goes on.   The contractions do not usually become progressively stronger, regular, and closer together as with true labor.  True Labor  Contractions in true labor last 30-70 seconds, become very regular, usually become more intense, and increase in frequency.   The contractions do not go away with walking.   The discomfort is usually felt in the top of the uterus and spreads to the lower abdomen and low back.   True labor can be  determined by your health care provider with an exam. This will show that the cervix is dilating and getting thinner.  WHAT TO REMEMBER  Keep up with your usual exercises and follow other instructions given by your health care provider.   Take medicines as directed by your health care provider.   Keep your regular prenatal appointments.   Eat and drink lightly if you think you are going into labor.   If Braxton Hicks contractions are making you uncomfortable:   Change your position from lying down or resting to walking, or from walking to resting.   Sit and rest in a tub of warm water.   Drink 2-3 glasses of water. Dehydration may cause these contractions.   Do slow and deep breathing several times an hour.  WHEN SHOULD I SEEK IMMEDIATE MEDICAL CARE? Seek immediate medical care if:  Your contractions become stronger, more regular, and closer together.   You have fluid leaking or gushing from your vagina.   You have a fever.   You pass blood-tinged mucus.   You have vaginal bleeding.   You have continuous abdominal pain.   You have low back pain that you never had before.   You feel your baby's head pushing down and causing pelvic pressure.   Your baby is not moving as much as it used to.  Document Released: 03/13/2005 Document Revised: 03/18/2013 Document Reviewed: 12/23/2012 [facility] Patient Information 2015 [place], [place]. This information is not intended to replace advice given to you by your health care  provider. Make sure you discuss any questions you have with your health care provider.    Candidal Vulvovaginitis Candidal vulvovaginitis is an infection of the vagina and vulva. The vulva is the skin around the opening of the vagina. This may cause itching and discomfort in and around the vagina.  HOME CARE  Only take medicine as told by your doctor.  Do not have sex (intercourse) until the infection is healed or as told by your  doctor.  Practice safe sex.  Tell your sex partner about your infection.  Do not douche or use tampons.  Wear cotton underwear. Do not wear tight pants or panty hose.  Eat yogurt. This may help treat and prevent yeast infections. GET HELP RIGHT AWAY IF:   You have a fever.  Your problems get worse during treatment or do not get better in 3 days.  You have discomfort, irritation, or itching in your vagina or vulva area.  You have pain after sex.  You start to get belly (abdominal) pain. MAKE SURE YOU:  Understand these instructions.  Will watch your condition.  Will get help right away if you are not doing well or get worse. Document Released: 06/09/2008 Document Revised: 03/18/2013 Document Reviewed: 06/09/2008 [facility] Patient Information 2015 [place], [place]. This information is not intended to replace advice given to you by your health care provider. Make sure you discuss any questions you have with your health care provider.

## 2014-07-18 NOTE — Progress Notes (Signed)
0652 pt up to BR

## 2014-07-18 NOTE — Progress Notes (Signed)
Fern slide obtained

## 2014-07-18 NOTE — Progress Notes (Signed)
Dr Deniece Ree in to discuss test results and d/c plan. Written and verbal d/c instructions given and understanding voiced. EFM strip reviewed.

## 2014-07-18 NOTE — MAU Note (Signed)
Leaking cl fld since 2300. Having a lot of pelvic pressure. Some ctxs.

## 2014-07-18 NOTE — Progress Notes (Signed)
Dr Lajuana Ripple notified of pt's admission and status. Aware of leaking since 2300, limited PNC, efm reassuring but not reactive yet. In BS and will see pt when finishes there

## 2014-07-18 NOTE — MAU Provider Note (Signed)
History     CSN: [id number]  Arrival date and time: 07/18/14 0509   None     Chief Complaint  Patient presents with  . Rupture of Membranes   HPI Patient is 24 y.o. [id number] [redacted]w[redacted]d here with complaints of pelvic pressure and LOF.  Patient thinks that she has been loosing her mucus plug.  She reports that she thinks that she started leaking fluid since last night. Unsure of color of fluid.  Also endorses increased pressure in her pelvic area over the last 48 hours.    +FM, denies VB, contractions, vaginal discharge.   OB History    Gravida Para Term Preterm AB TAB SAB Ectopic Multiple Living   6 3 3  0 2 0 2 0 0 3      Past Medical History  Diagnosis Date  . UTI (lower urinary tract infection)   . Chlamydia   . Headache(784.0)   . 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 Orif elbow fracture Right 12/02/2012    Procedure: OPEN REDUCTION INTERNAL FIXATION (ORIF) ELBOW/OLECRANON FRACTURE;  Surgeon: Schuyler Amor, MD;  Location: [place];  Service: Orthopedics;  Laterality: Right;    Family History  Problem Relation Age of Onset  . Hyperlipidemia Father   . Hypertension Father   . Cancer Paternal Grandmother   . Kidney disease Paternal Grandmother   . Diabetes Paternal Grandfather   . Cancer Paternal Aunt   . Hearing loss Neg Hx     History  Substance Use Topics  . Smoking status: Current Some Day Smoker -- 0.25 packs/day for 1 years    Types: Cigarettes  . Smokeless tobacco: Never Used     Comment: electronic- e-sigs-mostly  . Alcohol Use: No     Comment: rarely    Allergies: No Known Allergies  Prescriptions prior to admission  Medication Sig Dispense Refill Last Dose  . acetaminophen (TYLENOL) 500 MG tablet Take 1,000 mg by mouth every 6 (six) hours as needed for mild pain or headache.   Past Week at Unknown time  . calcium carbonate (TUMS - DOSED IN MG ELEMENTAL CALCIUM) 500 MG chewable  tablet Chew 3 tablets by mouth daily.   Past Week at Unknown time  . Prenatal Vit-Fe Fumarate-FA (PRENATAL MULTIVITAMIN) TABS tablet Take 1 tablet by mouth daily at 12 noon.   Past Month at Unknown time  . fluconazole (DIFLUCAN) 150 MG tablet Take 1 tablet (150 mg total) by mouth daily. (Patient not taking: Reported on 07/07/2014) 1 tablet 0 Not Taking  . promethazine (PHENERGAN) 25 MG tablet Take 0.5-1 tablets (12.5-25 mg total) by mouth every 6 (six) hours as needed for nausea or vomiting. (Patient not taking: Reported on 07/07/2014) 30 tablet 2 Not Taking    Review of Systems  Constitutional: Negative for fever and chills.  Genitourinary: Negative for dysuria and hematuria.       +leaking of fluid/mucousy vaginal discharge    Physical Exam   Blood pressure 122/68, pulse 109, temperature 97.6 F (36.4 C), resp. rate 18, height 5\' 5"  (1.651 m), weight 208 lb 12.8 oz (94.711 kg), last menstrual period 10/23/2013.  Physical Exam  Constitutional: She is oriented to person, place, and time. She appears well-developed and well-nourished. No distress.  HENT:  Head: Normocephalic and atraumatic.  Eyes: EOM are normal. No scleral icterus.  Neck: Normal range of motion.  Cardiovascular: Intact distal pulses.   Respiratory:  Effort normal.  GI: Soft.  Gravid   Genitourinary: Vaginal discharge (creamy yellow, no pooling appreciated) found.  Musculoskeletal: Normal range of motion. She exhibits no edema.  Neurological: She is alert and oriented to person, place, and time.  Skin: Skin is warm and dry. No rash noted.  Psychiatric: She has a normal mood and affect. Her behavior is normal.   Dilation: 3 Effacement (%): 40 Cervical Position: Posterior Exam by:: Dr Lajuana Ripple   Fetal monitoringBaseline: 125 bpm, Variability: Good {> 6 bpm) and Accelerations: Reactive Uterine activityFrequency: Irregular, Every 8-10 minutes  Results for orders placed or performed during the hospital encounter of  07/18/14 (from the past 24 hour(s))  Wet prep, genital     Status: Abnormal   Collection Time: 07/18/14  6:20 AM  Result Value Ref Range   Yeast Wet Prep HPF POC RARE (A) NONE SEEN   Trich, Wet Prep NONE SEEN NONE SEEN   Clue Cells Wet Prep HPF POC NONE SEEN NONE SEEN   WBC, Wet Prep HPF POC MANY (A) NONE SEEN  Fern Test     Status: Normal   Collection Time: 07/18/14  7:00 AM  Result Value Ref Range   POCT Fern Test Negative = intact amniotic membranes     MAU Course  Procedures  MDM  NST reactive No pooling appreciated, Fern test NEGATIVE Wet prep with rare yeast and many WBCs GC/CT considered but NEGATIVE on 07/16/14  Assessment and Plan  Vaginal discharge and pelvic pain in pregnancy.  Patient w/ h/o late and limited Prue -Patient encouraged to drink plenty of fluids -Miconazole 7 -Return precautions discussed -Patient to follow up with OB as scheduled for routine care  Ronnie Doss M, DO 07/18/2014, 6:06 AM   OB fellow attestation:  I have seen and examined this patient; I agree with above documentation in the resident's note.   Montgomery Rothlisberger is a 24 y.o. [phone] reporting losing her mucous plug and reporting leaking fluid +FM, denies VB, contractions, vaginal discharge.  PE: BP 121/68 mmHg  Pulse 113  Temp(Src) 97.6 F (36.4 C)  Resp 18  Ht 5\' 5"  (1.651 m)  Wt 208 lb 12.8 oz (94.711 kg)  BMI 34.75 kg/m2  LMP 10/23/2013 (Exact Date) Gen: calm comfortable, NAD Resp: normal effort, no distress Abd: gravid  ROS, labs, PMH reviewed NST reactive, irregular contractions Dilation: 3 Effacement (%): 40 Cervical Position: Posterior Exam by:: Dr Lajuana Ripple   Plan: - fetal kick counts reinforced, labor precautions - continue routine follow up in OB clinic - rx miconazole 7 for yeast vaginitis, no signs of rupture of membranes, vaginal discharge  Merla Riches, MD 2:29 PM

## 2014-07-23 ENCOUNTER — Ambulatory Visit (INDEPENDENT_AMBULATORY_CARE_PROVIDER_SITE_OTHER): Payer: Self-pay | Admitting: Family Medicine

## 2014-07-23 VITALS — BP 134/75 | HR 101 | Wt 207.0 lb

## 2014-07-23 DIAGNOSIS — O0930 Supervision of pregnancy with insufficient antenatal care, unspecified trimester: Secondary | ICD-10-CM

## 2014-07-23 DIAGNOSIS — O0933 Supervision of pregnancy with insufficient antenatal care, third trimester: Secondary | ICD-10-CM

## 2014-07-23 LAB — POCT URINALYSIS DIP (DEVICE)
BILIRUBIN URINE: NEGATIVE
Glucose, UA: NEGATIVE mg/dL
HGB URINE DIPSTICK: NEGATIVE
Ketones, ur: NEGATIVE mg/dL
NITRITE: NEGATIVE
Protein, ur: NEGATIVE mg/dL
SPECIFIC GRAVITY, URINE: 1.015 (ref 1.005–1.030)
UROBILINOGEN UA: 0.2 mg/dL (ref 0.0–1.0)
pH: 6.5 (ref 5.0–8.0)

## 2014-07-23 NOTE — Progress Notes (Signed)
Moderate leuks on udip

## 2014-07-23 NOTE — Progress Notes (Signed)
Growth U/S 07/24/14 @ 245p with Radiology.

## 2014-07-23 NOTE — Progress Notes (Signed)
Patient is 24 y.o. [id number] [redacted]w[redacted]d.  +FM, denies LOF, vaginal discharge. #sleeping: not sleeping well, may try OTC unisom or benadryl # spotting: small amount when urinated a few days ago, none since, could have been after intercourse, she cannot remember # contraction: irregular #very LTC: 1h gtt abn, will return for 3h gtt tomorrow, hx of 9lb 7oz baby, will order growth sono

## 2014-07-24 ENCOUNTER — Other Ambulatory Visit: Payer: Self-pay

## 2014-07-24 ENCOUNTER — Ambulatory Visit (HOSPITAL_COMMUNITY)
Admission: RE | Admit: 2014-07-24 | Discharge: 2014-07-24 | Disposition: A | Discharge status: Home / Self Care | Payer: Medicaid Other | Source: Ambulatory Visit | Attending: Family Medicine | Admitting: Family Medicine

## 2014-07-24 DIAGNOSIS — O0930 Supervision of pregnancy with insufficient antenatal care, unspecified trimester: Secondary | ICD-10-CM

## 2014-07-24 DIAGNOSIS — Z3A39 39 weeks gestation of pregnancy: Secondary | ICD-10-CM | POA: Diagnosis not present

## 2014-07-24 DIAGNOSIS — Z36 Encounter for antenatal screening of mother: Secondary | ICD-10-CM | POA: Insufficient documentation

## 2014-07-24 DIAGNOSIS — R7309 Other abnormal glucose: Secondary | ICD-10-CM

## 2014-07-24 DIAGNOSIS — O0933 Supervision of pregnancy with insufficient antenatal care, third trimester: Secondary | ICD-10-CM | POA: Diagnosis not present

## 2014-07-25 LAB — GLUCOSE TOLERANCE, 3 HOURS
GLUCOSE, 2 HOUR-GESTATIONAL: 149 mg/dL (ref 70–164)
Glucose Tolerance, 1 hour: 188 mg/dL (ref 70–189)
Glucose Tolerance, Fasting: 88 mg/dL (ref 70–104)
Glucose, GTT - 3 Hour: 161 mg/dL — ABNORMAL HIGH (ref 70–144)

## 2014-07-26 ENCOUNTER — Encounter: Payer: Self-pay | Admitting: Obstetrics & Gynecology

## 2014-07-26 DIAGNOSIS — O24419 Gestational diabetes mellitus in pregnancy, unspecified control: Secondary | ICD-10-CM | POA: Insufficient documentation

## 2014-07-26 NOTE — Progress Notes (Signed)
Quick Note:  Patient's 3 hour GTT is diagnostic of GDM. Given that she is already [redacted]w[redacted]d, there is no perceived significant benefit now from teaching or BS checking- > she needs IOL at 40 weeks. IOL scheduled at 07/30/14 ([redacted]w[redacted]d) at 0730. 07/24/14 EFW 8lb 10 oz, but she has had a 9 lb 7oz infant previously. Tried to call patient but got a generic voice mail message. Please call to inform patient of results and recommendations for IOL. Please cancel 07/31/14 clinic visit; but will need 2 hr GTT postpartum and regular postpartum visit at 6 weeks after delivery. Message forwarded to L&D team on call on 07/30/14.   Verita Schneiders, MD, [facility], [facility]     ______

## 2014-07-27 ENCOUNTER — Telehealth: Payer: Self-pay | Admitting: *Deleted

## 2014-07-27 NOTE — Telephone Encounter (Signed)
Called pt and left message that I am calling with important information from the doctor. Please call back and state whether a detailed message can be left on your voice mail. Pt needs to be informed that her 3hr GTT was abnormal and provides a diagnosis of Gestational Diabetes. For this reason she needs IOL @ 40 wks.  Appt is scheduled for 5/5 @ 0730.  She will not need appt in our office as previously scheduled on 5/6. Her PP appt is 6/20 @ 3pm. She will also need 2hr GTT @ >6wks PP.

## 2014-07-27 NOTE — Telephone Encounter (Signed)
Called [facility] per Dr. Harolyn Rutherford instruction and notified her + GDM and IOL scheduled for 07/30/14. Reviewed instructions for IOL . Also to come to MAU prior to that if signs of labor or distress. Taura voices understanding.

## 2014-07-28 ENCOUNTER — Telehealth: Payer: Self-pay

## 2014-07-28 NOTE — Telephone Encounter (Signed)
Called patient and informed her of diagnosis and appts. Instructed patient on induction instructions and the importance of 2 hr gtt after her pp visit. Patient verbalized understanding to all and had no questions

## 2014-07-28 NOTE — Telephone Encounter (Signed)
Opened in error

## 2014-07-30 ENCOUNTER — Inpatient Hospital Stay (HOSPITAL_COMMUNITY)
Admission: RE | Admit: 2014-07-30 | Discharge: 2014-08-02 | DRG: 775 | Disposition: A | Discharge status: Home / Self Care | Payer: Medicaid Other | Source: Ambulatory Visit | Attending: Family Medicine | Admitting: Family Medicine

## 2014-07-30 ENCOUNTER — Encounter (HOSPITAL_COMMUNITY): Payer: Self-pay

## 2014-07-30 VITALS — BP 119/55 | HR 80 | Temp 98.4°F | Resp 18 | Ht 65.0 in | Wt 208.0 lb

## 2014-07-30 DIAGNOSIS — Z87440 Personal history of urinary (tract) infections: Secondary | ICD-10-CM

## 2014-07-30 DIAGNOSIS — F17210 Nicotine dependence, cigarettes, uncomplicated: Secondary | ICD-10-CM | POA: Diagnosis present

## 2014-07-30 DIAGNOSIS — O99344 Other mental disorders complicating childbirth: Secondary | ICD-10-CM | POA: Diagnosis present

## 2014-07-30 DIAGNOSIS — O24420 Gestational diabetes mellitus in childbirth, diet controlled: Principal | ICD-10-CM | POA: Diagnosis present

## 2014-07-30 DIAGNOSIS — O99820 Streptococcus B carrier state complicating pregnancy: Secondary | ICD-10-CM

## 2014-07-30 DIAGNOSIS — O480 Post-term pregnancy: Secondary | ICD-10-CM | POA: Diagnosis present

## 2014-07-30 DIAGNOSIS — O3663X0 Maternal care for excessive fetal growth, third trimester, not applicable or unspecified: Secondary | ICD-10-CM | POA: Diagnosis present

## 2014-07-30 DIAGNOSIS — O99824 Streptococcus B carrier state complicating childbirth: Secondary | ICD-10-CM | POA: Diagnosis present

## 2014-07-30 DIAGNOSIS — Z3A40 40 weeks gestation of pregnancy: Secondary | ICD-10-CM | POA: Diagnosis present

## 2014-07-30 DIAGNOSIS — O99334 Smoking (tobacco) complicating childbirth: Secondary | ICD-10-CM | POA: Diagnosis present

## 2014-07-30 DIAGNOSIS — Z3490 Encounter for supervision of normal pregnancy, unspecified, unspecified trimester: Secondary | ICD-10-CM

## 2014-07-30 DIAGNOSIS — F329 Major depressive disorder, single episode, unspecified: Secondary | ICD-10-CM | POA: Diagnosis present

## 2014-07-30 DIAGNOSIS — O0933 Supervision of pregnancy with insufficient antenatal care, third trimester: Secondary | ICD-10-CM | POA: Diagnosis not present

## 2014-07-30 DIAGNOSIS — O24419 Gestational diabetes mellitus in pregnancy, unspecified control: Secondary | ICD-10-CM

## 2014-07-30 DIAGNOSIS — O0930 Supervision of pregnancy with insufficient antenatal care, unspecified trimester: Secondary | ICD-10-CM

## 2014-07-30 LAB — GLUCOSE, CAPILLARY: GLUCOSE-CAPILLARY: 71 mg/dL (ref 70–99)

## 2014-07-30 LAB — CBC
HCT: 32.4 % — ABNORMAL LOW (ref 36.0–46.0)
HEMOGLOBIN: 10.7 g/dL — AB (ref 12.0–15.0)
MCH: 28.1 pg (ref 26.0–34.0)
MCHC: 33 g/dL (ref 30.0–36.0)
MCV: 85 fL (ref 78.0–100.0)
Platelets: 279 10*3/uL (ref 150–400)
RBC: 3.81 MIL/uL — AB (ref 3.87–5.11)
RDW: 15 % (ref 11.5–15.5)
WBC: 14.1 10*3/uL — ABNORMAL HIGH (ref 4.0–10.5)

## 2014-07-30 LAB — SYPHILIS: RPR W/REFLEX TO RPR TITER AND TREPONEMAL ANTIBODIES, TRADITIONAL SCREENING AND DIAGNOSIS ALGORITHM: RPR Ser Ql: NONREACTIVE

## 2014-07-30 LAB — TYPE AND SCREEN
ABO/RH(D): O POS
Antibody Screen: NEGATIVE

## 2014-07-30 LAB — ABO/RH: ABO/RH(D): O POS

## 2014-07-30 MED ORDER — OXYTOCIN 40 UNITS IN LACTATED RINGERS INFUSION - SIMPLE MED
62.5000 mL/h | INTRAVENOUS | Status: DC
  Administered 2014-07-31: 62.5 mL/h via INTRAVENOUS
  Filled 2014-07-30: qty 1000

## 2014-07-30 MED ORDER — OXYTOCIN BOLUS FROM INFUSION: 500.0000 mL | INTRAVENOUS | Status: DC

## 2014-07-30 MED ORDER — EPHEDRINE 5 MG/ML INJ
10.0000 mg | INTRAVENOUS | Status: DC | PRN
  Filled 2014-07-30: qty 2

## 2014-07-30 MED ORDER — ACETAMINOPHEN 325 MG PO TABS: 650.0000 mg | ORAL_TABLET | ORAL | Status: DC | PRN

## 2014-07-30 MED ORDER — CITRIC ACID-SODIUM CITRATE 334-500 MG/5ML PO SOLN: 30.0000 mL | ORAL | Status: DC | PRN

## 2014-07-30 MED ORDER — DIPHENHYDRAMINE HCL 50 MG/ML IJ SOLN: 12.5000 mg | INTRAMUSCULAR | Status: DC | PRN

## 2014-07-30 MED ORDER — LACTATED RINGERS IV SOLN
INTRAVENOUS | Status: DC
  Administered 2014-07-30 – 2014-07-31 (×2): via INTRAVENOUS

## 2014-07-30 MED ORDER — FENTANYL CITRATE (PF) 100 MCG/2ML IJ SOLN
100.0000 ug | INTRAMUSCULAR | Status: DC | PRN
  Administered 2014-07-30 (×2): 100 ug via INTRAVENOUS
  Filled 2014-07-30 (×2): qty 2

## 2014-07-30 MED ORDER — PHENYLEPHRINE 40 MCG/ML (10ML) SYRINGE FOR IV PUSH (FOR BLOOD PRESSURE SUPPORT)
80.0000 ug | PREFILLED_SYRINGE | INTRAVENOUS | Status: DC | PRN
  Filled 2014-07-30 (×2): qty 20
  Filled 2014-07-30: qty 2

## 2014-07-30 MED ORDER — PENICILLIN G POTASSIUM 5000000 UNITS IJ SOLR
2.5000 10*6.[IU] | INTRAVENOUS | Status: DC
  Administered 2014-07-30 – 2014-07-31 (×7): 2.5 10*6.[IU] via INTRAVENOUS
  Filled 2014-07-30 (×11): qty 2.5

## 2014-07-30 MED ORDER — OXYCODONE-ACETAMINOPHEN 5-325 MG PO TABS: 1.0000 | ORAL_TABLET | ORAL | Status: DC | PRN

## 2014-07-30 MED ORDER — LIDOCAINE HCL (PF) 1 % IJ SOLN
30.0000 mL | INTRAMUSCULAR | Status: DC | PRN
  Filled 2014-07-30: qty 30

## 2014-07-30 MED ORDER — OXYCODONE-ACETAMINOPHEN 5-325 MG PO TABS: 2.0000 | ORAL_TABLET | ORAL | Status: DC | PRN

## 2014-07-30 MED ORDER — TERBUTALINE SULFATE 1 MG/ML IJ SOLN: 0.2500 mg | Freq: Once | INTRAMUSCULAR | Status: AC | PRN

## 2014-07-30 MED ORDER — LACTATED RINGERS IV SOLN
500.0000 mL | INTRAVENOUS | Status: DC | PRN
  Administered 2014-07-31: 1000 mL via INTRAVENOUS

## 2014-07-30 MED ORDER — FLEET ENEMA 7-19 GM/118ML RE ENEM: 1.0000 | ENEMA | Freq: Every day | RECTAL | Status: DC | PRN

## 2014-07-30 MED ORDER — PENICILLIN G POTASSIUM 5000000 UNITS IJ SOLR
5.0000 10*6.[IU] | Freq: Once | INTRAVENOUS | Status: AC
  Administered 2014-07-30: 5 10*6.[IU] via INTRAVENOUS
  Filled 2014-07-30: qty 5

## 2014-07-30 MED ORDER — FENTANYL 2.5 MCG/ML BUPIVACAINE 1/10 % EPIDURAL INFUSION (WH - ANES)
14.0000 mL/h | INTRAMUSCULAR | Status: DC | PRN
  Administered 2014-07-31: 14 mL/h via EPIDURAL
  Filled 2014-07-30: qty 125

## 2014-07-30 MED ORDER — OXYTOCIN 40 UNITS IN LACTATED RINGERS INFUSION - SIMPLE MED
1.0000 m[IU]/min | INTRAVENOUS | Status: DC
  Administered 2014-07-30: 2 m[IU]/min via INTRAVENOUS

## 2014-07-30 MED ORDER — ONDANSETRON HCL 4 MG/2ML IJ SOLN: 4.0000 mg | Freq: Four times a day (QID) | INTRAMUSCULAR | Status: DC | PRN

## 2014-07-30 NOTE — Progress Notes (Signed)
Fra C., cnm attempted to AROM, station still ballotable, AROM not done.

## 2014-07-30 NOTE — Progress Notes (Signed)
Kylie [name] is a 24 y.o. [id number] at [redacted]w[redacted]d by LMP admitted for induction of labor due to Gestational diabetes.  Subjective: Patient's baby had decel that began as I was leaving the room.  Patient was repositioned until FHR was in appropriate range.  Pit was stopped.    Objective: BP 121/51 mmHg  Pulse 90  Resp 18  Ht 5\' 5"  (1.651 m)  Wt 208 lb (94.348 kg)  BMI 34.61 kg/m2  LMP 10/23/2013 (Exact Date)     FHT:  FHR: 125 bpm, variability: moderate,  accelerations:  Present,  decelerations:  Present s/p 1 decel lasting 5 minutes UC:   irregular, every 2-5 minutes SVE:   Dilation: 4.5 Effacement (%): 50 Station: Ballotable Exam by:: J.Thornton, RN  Labs: Lab Results  Component Value Date   WBC 14.1* 07/30/2014   HGB 10.7* 07/30/2014   HCT 32.4* 07/30/2014   MCV 85.0 07/30/2014   PLT 279 07/30/2014    Assessment / Plan: Induction of labor due to gestational diabetes. Will hold pitocin for at least 30 minutes.  If no decels during this interval will resume Pit at 4.  Labor: anticipate SVD, however [name], CNM discussed with patient possibility of needing emergent c-section if continued decels. Fetal Wellbeing:  Category II Pain Control:  Epidural upon maternal request I/D:  GBS+, PCN Anticipated MOD:  NSVD  [name], DO 07/30/2014, 1:29 PM

## 2014-07-30 NOTE — Progress Notes (Signed)
Patient ID: Kylie [name], female   DOB: [birth year], 24 y.o.   MRN: [id number] My Rinke is a 24 y.o. [id number] at [redacted]w[redacted]d.  Subjective: Called to [facility] for prolonged decel.   Objective: BP 117/69 mmHg  Pulse 91  Temp(Src) 98.4 F (36.9 C) (Oral)  Resp 18  Ht 5\' 5"  (1.651 m)  Wt 208 lb (94.348 kg)  BMI 34.61 kg/m2  LMP 10/23/2013 (Exact Date)   FHT:  FHR: 150 bpm, variability: moderate,  accelerations:  15x15,  decelerations:  Prolonged x 6 minutes. Down to 30's. Slow return to baseline of 120-130. Resolved after position changes, D/C pitocin, O2. Baby very active.  UC:   Irreg, mild   Dilation: 4.5 Effacement (%): 50 Station: Ballotable Presentation: Vertex Exam by:: J.Thornton, RN  Labs: Results for orders placed or performed during the hospital encounter of 07/30/14 (from the past 24 hour(s))  CBC     Status: Abnormal   Collection Time: 07/30/14  8:15 AM  Result Value Ref Range   WBC 14.1 (H) 4.0 - 10.5 K/uL   RBC 3.81 (L) 3.87 - 5.11 MIL/uL   Hemoglobin 10.7 (L) 12.0 - 15.0 g/dL   HCT 32.4 (L) 36.0 - 46.0 %   MCV 85.0 78.0 - 100.0 fL   MCH 28.1 26.0 - 34.0 pg   MCHC 33.0 30.0 - 36.0 g/dL   RDW 15.0 11.5 - 15.5 %   Platelets 279 150 - 400 K/uL  Type and screen     Status: None   Collection Time: 07/30/14  8:15 AM  Result Value Ref Range   ABO/RH(D) O POS    Antibody Screen NEG    Sample Expiration 08/02/2014     Assessment / Plan: [redacted]w[redacted]d week IUP Labor: early, IOL Fetal Wellbeing:  Category II Pain Control:  None Anticipated MOD:  SVD Pitocin off. Restart at half (4) if FHR reassuring x 30 minutes.  Dr. Ihor Dow notified.   Wilburn, CNM 07/30/2014 2:30 PM

## 2014-07-30 NOTE — Progress Notes (Signed)
   Kylie [name] is a 24 y.o. [id number] at [redacted]w[redacted]d  admitted for induction of labor due to Gestational diabetes.  Subjective:  Comfortable with IV meds Objective: Filed Vitals:   07/30/14 1832 07/30/14 1902 07/30/14 1932 07/30/14 2005  BP: 93/49 104/50 106/60 117/53  Pulse: 86 80 74 72  Temp:    98 F (36.7 C)  TempSrc:    Oral  Resp: 20  18 19   Height:      Weight:      SpO2:        Blood sugar 78  FHT:  FHR: 135 bpm, variability: moderate,  accelerations:  Present,  decelerations:  Absent UC:   irregular, every 3-5 minutes SVE:   Dilation: 5 Effacement (%): 50 Station: Ballotable Exam by:: [email or web address], rnc  Pitocin @ 22 mu/min  Labs: Lab Results  Component Value Date   WBC 14.1* 07/30/2014   HGB 10.7* 07/30/2014   HCT 32.4* 07/30/2014   MCV 85.0 07/30/2014   PLT 279 07/30/2014    Assessment / Plan: IOL for A1DM, slow progress.  Vtx not applied to cx, so unable to ROM now.   Wil continue to increase pitocin and check BS q 4 hours Labor:  latent Fetal Wellbeing:  Category I Pain Control:  Fentanyl Anticipated MOD:  NSVD  CRESENZO-DISHMAN,Shermeka Rutt 07/30/2014, 10:16 PM

## 2014-07-30 NOTE — H&P (Signed)
LABOR ADMISSION HISTORY AND PHYSICAL  Kylie [name] is a 24 y.o. female [phone] with IUP at [redacted]w[redacted]d by LMP presenting for IOL 2/2 A1GDM. She reports +FMs.  Denies LOF, VB, blurry vision, headaches, peripheral edema, RUQ pain.  She plans on bottle feeding. She request IUD for birth control.  She has a placed picked for pediatric care after discharge but is unsure of what the name of the facility is.  Dating: By LMP --->  Estimated Date of Delivery: 07/30/14 Sono:   @[redacted]w[redacted]d , CWD, normal anatomy, cephalic presentation, anterior placenta, 3990g, >90% EFW  Prenatal History/Complications:  Clinic [facility] Prenatal Labs  Dating  LMP= 10.3 week [place] Blood type: O/POS/-- (04/12 1434)   Genetic Screen 1 Screen:    AFP:     Quad:     NIPS: Antibody:NEG (04/12 1434)  Anatomic [place] Nl 03/10/14 Rubella: 6.93 (04/12 1434)  GTT     Third trimester: 141 RPR: NON REAC (04/12 1434)   Flu vaccine  declined HBsAg: NEGATIVE (04/12 1434)   TDaP vaccine                   HIV: NONREACTIVE (04/12 1434)   GBS  positive RAQ:[id number] (04/12 0000)  Contraception  mirena Pap:09/2012 negative  Baby Food  bottle   Circumcision  yes   Pediatrician    Support Person  boyfriend    Past Medical History: Past Medical History  Diagnosis Date  . UTI (lower urinary tract infection)   . Chlamydia   . Headache(784.0)   . 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 Orif elbow fracture Right 12/02/2012    Procedure: OPEN REDUCTION INTERNAL FIXATION (ORIF) ELBOW/OLECRANON FRACTURE;  Surgeon: Schuyler Amor, MD;  Location: [place];  Service: Orthopedics;  Laterality: Right;    Obstetrical History: OB History    Gravida Para Term Preterm AB TAB SAB Ectopic Multiple Living   6 3 3  0 2 0 2 0 0 3      Social History: History   Social History  . Marital Status: Single    Spouse Name: N/A  . Number of Children: N/A  . Years of Education: N/A    Social History Main Topics  . Smoking status: Current Some Day Smoker -- 0.25 packs/day for 1 years    Types: Cigarettes  . Smokeless tobacco: Never Used     Comment: electronic- e-sigs-mostly  . Alcohol Use: No     Comment: rarely  . Drug Use: No  . Sexual Activity: Yes    Birth Control/ Protection: None   Other Topics Concern  . None   Social History Narrative    Family History: Family History  Problem Relation Age of Onset  . Hyperlipidemia Father   . Hypertension Father   . Cancer Paternal Grandmother   . Kidney disease Paternal Grandmother   . Diabetes Paternal Grandfather   . Cancer Paternal Aunt   . Hearing loss Neg Hx     Allergies: No Known Allergies  Prescriptions prior to admission  Medication Sig Dispense Refill Last Dose  . acetaminophen (TYLENOL) 500 MG tablet Take 1,000 mg by mouth every 6 (six) hours as needed for mild pain or headache.   Taking  . calcium carbonate (TUMS - DOSED IN MG ELEMENTAL CALCIUM) 500 MG chewable tablet Chew 3 tablets by mouth daily.   Taking  . miconazole (MICONAZOLE 7) 100 MG vaginal  suppository Place 1 suppository (100 mg total) vaginally at bedtime. (Patient not taking: Reported on 07/23/2014) 7 suppository 0 Not Taking  . Prenatal Vit-Fe Fumarate-FA (PRENATAL MULTIVITAMIN) TABS tablet Take 1 tablet by mouth daily at 12 noon.   Taking  . promethazine (PHENERGAN) 25 MG tablet Take 0.5-1 tablets (12.5-25 mg total) by mouth every 6 (six) hours as needed for nausea or vomiting. (Patient not taking: Reported on 07/07/2014) 30 tablet 2 Not Taking   Review of Systems   All systems reviewed and negative except as stated in HPI  Blood pressure 121/51, pulse 90, resp. rate 18, height 5\' 5"  (1.651 m), weight 208 lb (94.348 kg), last menstrual period 10/23/2013. General appearance: alert, cooperative, appears stated age and no distress Lungs: clear to auscultation bilaterally, no increased WOB Heart: regular rate and rhythm, no  m/r/g Abdomen: soft, non-tender; bowel sounds normal Pelvic: normal external vaginal tissue, CE as below Extremities: WWP, Homans sign is negative, no sign of DVT, +2 DP Neuro: no focal deficits, follows commands Presentation: cephalic Fetal monitoringBaseline: 125 bpm and Variability: Fair (1-6 bpm)/Moderate>6 Uterine activity irregular contraction with uterine irritation  Dilation: 3 Effacement (%): 50 Station: -3 Exam by:: gottschalk, resident  Prenatal labs: ABO, Rh: O/POS/-- (04/12 1434) Antibody: NEG (04/12 1434) Rubella:  IMMUNE RPR: NON REAC (04/12 1434)  HBsAg: NEGATIVE (04/12 1434)  HIV: NONREACTIVE (04/12 1434)  GBS: Positive (04/12 0000)  1 hr Glucola 3rd trimester 141 Genetic screening  Too late Anatomy US normal  Prenatal Transfer Tool  Maternal Diabetes: Yes:  Diabetes Type:  Diet controlled Genetic Screening: Declined Maternal Ultrasounds/Referrals: Normal Fetal Ultrasounds or other Referrals:  None Maternal Substance Abuse:  No Significant Maternal Medications:  None Significant Maternal Lab Results: Lab values include: Group B Strep positive  Results for orders placed or performed during the hospital encounter of 07/30/14 (from the past 24 hour(s))  CBC   Collection Time: 07/30/14  8:15 AM  Result Value Ref Range   WBC 14.1 (H) 4.0 - 10.5 K/uL   RBC 3.81 (L) 3.87 - 5.11 MIL/uL   Hemoglobin 10.7 (L) 12.0 - 15.0 g/dL   HCT 32.4 (L) 36.0 - 46.0 %   MCV 85.0 78.0 - 100.0 fL   MCH 28.1 26.0 - 34.0 pg   MCHC 33.0 30.0 - 36.0 g/dL   RDW 15.0 11.5 - 15.5 %   Platelets 279 150 - 400 K/uL    Patient Active Problem List   Diagnosis Date Noted  . Pregnancy 07/30/2014  . Gestational diabetes mellitus diagnosed at 78 weeks, antepartum 07/26/2014  . Limited prenatal care with visits at 85 and 39 weeks 07/16/2014  . GBS (group B Streptococcus carrier), +RV culture, currently pregnant 07/09/2014    Assessment: Kylie [name] is a 24 y.o. [id number] at [redacted]w[redacted]d  here for IOL 2/2 GDM  #Labor: Start pitocin at 2x2 #Pain: Epidural at maternal request #FWB: Cat 1 #ID:  GBS+, PCN #MOF: Bottle #MOC: IUD #Circ:  OP circ  [name], DO 07/30/2014, 10:36 AM  I was present for the exam and agree with above.  Rulo, CNM 07/30/2014 1:32 PM

## 2014-07-30 NOTE — Progress Notes (Signed)
Kylie [name] is a 24 y.o. [id number] at [redacted]w[redacted]d by LMP admitted for induction of labor due to Gestational diabetes.  Subjective: Patient reports that she is feeling more pressure/pain with contractions in her lower abdomen and back.  She is wanting pain medication but not ready for epidural at this time.  Objective: BP 108/65 mmHg  Pulse 87  Temp(Src) 98 F (36.7 C) (Oral)  Resp 18  Ht 5\' 5"  (1.651 m)  Wt 208 lb (94.348 kg)  BMI 34.61 kg/m2  SpO2 95%  LMP 10/23/2013 (Exact Date)     FHT:  FHR: 117 bpm, variability: moderate,  accelerations:  Present,  decelerations:  Absent UC:   q3-5 mins SVE:   Dilation: 5 Effacement (%): 50 Station: Ballotable Exam by:: J.Thornton, RN  Labs: Lab Results  Component Value Date   WBC 14.1* 07/30/2014   HGB 10.7* 07/30/2014   HCT 32.4* 07/30/2014   MCV 85.0 07/30/2014   PLT 279 07/30/2014    Assessment / Plan: Induction of labor due to gestational diabetes,  progressing well on pitocin  Labor: Progressing on Pitocin, will continue to increase then AROM Fetal Wellbeing:  Category I Pain Control:  Fentanyl I/D:  GBS+, PCN Anticipated MOD:  NSVD  [name], DO 07/30/2014, 6:20 PM

## 2014-07-31 ENCOUNTER — Encounter (HOSPITAL_COMMUNITY): Payer: Self-pay

## 2014-07-31 ENCOUNTER — Inpatient Hospital Stay (HOSPITAL_COMMUNITY): Payer: Medicaid Other | Admitting: Anesthesiology

## 2014-07-31 ENCOUNTER — Encounter: Payer: Self-pay | Admitting: Obstetrics and Gynecology

## 2014-07-31 DIAGNOSIS — O99824 Streptococcus B carrier state complicating childbirth: Secondary | ICD-10-CM

## 2014-07-31 DIAGNOSIS — O24429 Gestational diabetes mellitus in childbirth, unspecified control: Secondary | ICD-10-CM

## 2014-07-31 DIAGNOSIS — Z3A40 40 weeks gestation of pregnancy: Secondary | ICD-10-CM

## 2014-07-31 LAB — GLUCOSE, CAPILLARY
Glucose-Capillary: 98 mg/dL (ref 70–99)
Glucose-Capillary: 94 mg/dL (ref 70–99)
Glucose-Capillary: 88 mg/dL (ref 70–99)

## 2014-07-31 MED ORDER — LANOLIN HYDROUS EX OINT: TOPICAL_OINTMENT | CUTANEOUS | Status: DC | PRN

## 2014-07-31 MED ORDER — WITCH HAZEL-GLYCERIN EX PADS: 1.0000 "application " | MEDICATED_PAD | CUTANEOUS | Status: DC | PRN

## 2014-07-31 MED ORDER — SENNOSIDES-DOCUSATE SODIUM 8.6-50 MG PO TABS
2.0000 | ORAL_TABLET | ORAL | Status: DC
  Administered 2014-08-01 – 2014-08-02 (×2): 2 via ORAL
  Filled 2014-07-31 (×2): qty 2

## 2014-07-31 MED ORDER — LIDOCAINE HCL (PF) 1 % IJ SOLN
INTRAMUSCULAR | Status: DC | PRN
  Administered 2014-07-31: 6 mL
  Administered 2014-07-31: 4 mL

## 2014-07-31 MED ORDER — PRENATAL MULTIVITAMIN CH
1.0000 | ORAL_TABLET | Freq: Every day | ORAL | Status: DC
  Administered 2014-08-01: 1 via ORAL
  Filled 2014-07-31: qty 1

## 2014-07-31 MED ORDER — OXYTOCIN 40 UNITS IN LACTATED RINGERS INFUSION - SIMPLE MED
1.0000 m[IU]/min | INTRAVENOUS | Status: DC
  Administered 2014-07-31: 10 m[IU]/min via INTRAVENOUS
  Filled 2014-07-31: qty 1000

## 2014-07-31 MED ORDER — ONDANSETRON HCL 4 MG/2ML IJ SOLN: 4.0000 mg | INTRAMUSCULAR | Status: DC | PRN

## 2014-07-31 MED ORDER — SIMETHICONE 80 MG PO CHEW: 80.0000 mg | CHEWABLE_TABLET | ORAL | Status: DC | PRN

## 2014-07-31 MED ORDER — OXYCODONE-ACETAMINOPHEN 5-325 MG PO TABS
1.0000 | ORAL_TABLET | ORAL | Status: DC | PRN
  Administered 2014-08-01 (×3): 1 via ORAL
  Filled 2014-07-31 (×3): qty 1

## 2014-07-31 MED ORDER — BENZOCAINE-MENTHOL 20-0.5 % EX AERO
1.0000 | INHALATION_SPRAY | CUTANEOUS | Status: DC | PRN
Start: 2014-07-31 — End: 2014-08-02

## 2014-07-31 MED ORDER — ZOLPIDEM TARTRATE 5 MG PO TABS
5.0000 mg | ORAL_TABLET | Freq: Every evening | ORAL | Status: DC | PRN
  Administered 2014-07-31: 5 mg via ORAL
  Filled 2014-07-31: qty 1

## 2014-07-31 MED ORDER — ZOLPIDEM TARTRATE 5 MG PO TABS: 5.0000 mg | ORAL_TABLET | Freq: Every evening | ORAL | Status: DC | PRN

## 2014-07-31 MED ORDER — DIBUCAINE 1 % RE OINT: 1.0000 "application " | TOPICAL_OINTMENT | RECTAL | Status: DC | PRN

## 2014-07-31 MED ORDER — DIPHENHYDRAMINE HCL 25 MG PO CAPS: 25.0000 mg | ORAL_CAPSULE | Freq: Four times a day (QID) | ORAL | Status: DC | PRN

## 2014-07-31 MED ORDER — FENTANYL 2.5 MCG/ML BUPIVACAINE 1/10 % EPIDURAL INFUSION (WH - ANES)
14.0000 mL/h | INTRAMUSCULAR | Status: DC | PRN
Start: 2014-07-31 — End: 2014-07-31

## 2014-07-31 MED ORDER — ONDANSETRON HCL 4 MG PO TABS: 4.0000 mg | ORAL_TABLET | ORAL | Status: DC | PRN

## 2014-07-31 MED ORDER — ACETAMINOPHEN 325 MG PO TABS: 650.0000 mg | ORAL_TABLET | ORAL | Status: DC | PRN

## 2014-07-31 MED ORDER — IBUPROFEN 600 MG PO TABS
600.0000 mg | ORAL_TABLET | Freq: Four times a day (QID) | ORAL | Status: DC
  Administered 2014-08-01 – 2014-08-02 (×6): 600 mg via ORAL
  Filled 2014-07-31 (×6): qty 1

## 2014-07-31 NOTE — Progress Notes (Signed)
Per night shift RN. Fasting CBG performed at 0720 105.

## 2014-07-31 NOTE — Progress Notes (Addendum)
Patient ID: Kylie [name], female   DOB: 02/28/1991, 24 y.o.   MRN: [id number] Kylie [name] is a 24 y.o. [id number] at [redacted]w[redacted]d admitted for IOL indicated by A1GDM  Subjective: CTSP d/t pt dissatisfaction. Comfortable, but she and FOB frustrated due to day 2 of IOL and no progress. Has been off pitocin since 0200 and has had shower and eaten. LGA by [place] 1 wk ago.  Hx NSVD 9#7oz.   Objective: BP 121/66 mmHg  Pulse 93  Temp(Src) 98.4 F (36.9 C) (Oral)  Resp 18  Ht 5\' 5"  (1.651 m)  Wt 94.348 kg (208 lb)  BMI 34.61 kg/m2  SpO2 95%  LMP 10/23/2013 (Exact Date)  Fetal Heart FHR: 125-130 bpm, variability: moderate,  accelerations:  Present,  decelerations:  Absent   Contractions: slight UI with pitocin at 4 mu Abd: soft, NT, cephalic movable; EFW 8 7/2# SVE:   Dilation: 5.5 Effacement (%): 50 Station: Ballotable Exam by:: [email or web address],rnc-ob  CBGs 71-98  Assessment / Plan: Reassured and explained POC, answered all questions Continue PCN for GBS carriage Labor: favorable cx Fetal Wellbeing: Category 1 Pain Control:  N/a, plans epidural Expected mode of delivery: NSVD  Kylie [name] 07/31/2014, 9:47 AM

## 2014-07-31 NOTE — Progress Notes (Signed)
   Kylie [name] is a 24 y.o. [phone] at [redacted]w[redacted]d  admitted for induction of labor due to Gestational diabetes.  Subjective: Not having any contractions/discomfort  Objective: Filed Vitals:   07/30/14 1932 07/30/14 2005 07/30/14 2210 07/31/14 0105  BP: 106/60 117/53 110/54 109/53  Pulse: 74 72 70 76  Temp:  98 F (36.7 C)  98.7 F (37.1 C)  TempSrc:  Oral  Oral  Resp: 18 19  18   Height:      Weight:      SpO2:          FHT:  FHR: 120 bpm, variability: moderate,  accelerations:  Present,  decelerations:  Absent UC:   irregular, every 2-3 minutes SVE:   Dilation: 5.5 Effacement (%): 50 Station: Ballotable Exam by:: Kylie [name] C., cnm Pitocin @ 30 mu/min  Labs: Lab Results  Component Value Date   WBC 14.1* 07/30/2014   HGB 10.7* 07/30/2014   HCT 32.4* 07/30/2014   MCV 85.0 07/30/2014   PLT 279 07/30/2014    Assessment / Plan: IOL for A2DM, arrest of latent phast labor.  WIll stop pitocin for a few hours then restart.   Labor: stalled Fetal Wellbeing:  Category I Pain Control:  Labor support without medications Anticipated MOD:  NSVD  Kylie [name] 07/31/2014, 2:27 AM

## 2014-07-31 NOTE — Progress Notes (Signed)
FOB expressing concerns why IOL is unsuccessful and taking so long. DPoe,cnm called to come speak to pt and family.

## 2014-07-31 NOTE — Progress Notes (Signed)
Pt evaluated by Manus Gunning C., cnm SVE unchanged, pit dc. Pt for rest, pit to be started after 8am as per provider

## 2014-07-31 NOTE — Progress Notes (Signed)
0720 pt instructed how to call for breakfast, iv dressed and items given for shower.

## 2014-07-31 NOTE — Progress Notes (Signed)
0810 pt had returned from shower and still had questions regarding how to order from menu. Encouraged to quickly make a decision and order breakfast and call out as soon as she is finished so IOL can begin.

## 2014-07-31 NOTE — Anesthesia Procedure Notes (Signed)

## 2014-07-31 NOTE — Progress Notes (Signed)
New bag of pitocin hung.

## 2014-07-31 NOTE — Progress Notes (Signed)
DPoe,cnm at bs to see pt.

## 2014-07-31 NOTE — Anesthesia Preprocedure Evaluation (Signed)
Anesthesia Evaluation  Patient identified by MRN, date of birth, ID band Patient awake    Reviewed: Allergy & Precautions, H&P , Patient's Chart, lab work & pertinent test results  Airway Mallampati: II  TM Distance: >3 FB Neck ROM: full    Dental  (+) Teeth Intact   Pulmonary Current Smoker,  breath sounds clear to auscultation        Cardiovascular Rhythm:regular Rate:Normal     Neuro/Psych    GI/Hepatic   Endo/Other  diabetes, Gestational  Renal/GU      Musculoskeletal   Abdominal   Peds  Hematology   Anesthesia Other Findings       Reproductive/Obstetrics (+) Pregnancy                             Anesthesia Physical Anesthesia Plan  ASA: II  Anesthesia Plan: Epidural   Post-op Pain Management:    Induction:   Airway Management Planned:   Additional Equipment:   Intra-op Plan:   Post-operative Plan:   Informed Consent: I have reviewed the patients History and Physical, chart, labs and discussed the procedure including the risks, benefits and alternatives for the proposed anesthesia with the patient or authorized representative who has indicated his/her understanding and acceptance.   Dental Advisory Given  Plan Discussed with:   Anesthesia Plan Comments: (Labs checked- platelets confirmed with RN in room. Fetal heart tracing, per RN, reported to be stable enough for sitting procedure. Discussed epidural, and patient consents to the procedure:  included risk of possible headache,backache, failed block, allergic reaction, and nerve injury. This patient was asked if she had any questions or concerns before the procedure started.)        Anesthesia Quick Evaluation

## 2014-07-31 NOTE — Progress Notes (Signed)
CBG 

## 2014-07-31 NOTE — Progress Notes (Signed)
Patient ID: Kylie [name], female   DOB: Jul 29, 1990, 24 y.o.   MRN: [id number] Kylie [name] is a 24 y.o. [id number] at [redacted]w[redacted]d admitted for IOL for A1 GDM  Subjective: Had SROM clear fluid 1 hr ago and feeling pressure since.   Objective: BP 125/67 mmHg  Pulse 87  Temp(Src) 97.8 F (36.6 C) (Oral)  Resp 20  Ht 5\' 5"  (1.651 m)  Wt 94.348 kg (208 lb)  BMI 34.61 kg/m2  SpO2 96%  LMP 10/23/2013 (Exact Date)  Fetal Heart FHR: 120-125 bpm, variability: moderate,  accelerations:  Present,  decelerations:  Absent   Contractions: q 3 min  SVE:   Dilation: 10 Effacement (%): 100 Station: +2 Exam by:: [email or web address],cnm CBG (last 3)   Recent Labs  07/31/14 0233 07/31/14 1118 07/31/14 1502  GLUCAP 98 94 88      Assessment / Plan:  Labor: 2nd stage Fetal Wellbeing: Category 1 Pain Control:  epidural Expected mode of delivery: NSVD  Norlene Lanes 07/31/2014, 6:16 PM

## 2014-07-31 NOTE — Progress Notes (Signed)
Patient ID: Kylie [name], female   DOB: [birth year], 24 y.o.   MRN: [id number] Kylie [name] is a 24 y.o. [id number] at [redacted]w[redacted]d admitted for IOL for A1GDM Subjective: Requests epidural  Objective: BP 110/68 mmHg  Pulse 89  Temp(Src) 98.1 F (36.7 C) (Oral)  Resp 20  Ht 5\' 5"  (1.651 m)  Wt 94.348 kg (208 lb)  BMI 34.61 kg/m2  SpO2 95%  LMP 10/23/2013 (Exact Date)  Fetal Heart FHR: 125 bpm, variability: moderate,  accelerations:  Present,  decelerations:  Absent   Contractions: q3-4  SVE:   Dilation: 7 Effacement (%): 80 Station: -2 Exam by:: [email or web address],rnc-ob  Assessment / Plan:  Labor: active Fetal Wellbeing: cat 1 Pain Control:  Epidural asap Expected mode of delivery: NSVD  Kylie [name] 07/31/2014, 3:00 PM

## 2014-08-01 LAB — GLUCOSE, CAPILLARY: GLUCOSE-CAPILLARY: 123 mg/dL — AB (ref 70–99)

## 2014-08-01 NOTE — Anesthesia Postprocedure Evaluation (Signed)
Anesthesia Post Note  Patient: Kylie [name]  Procedure(s) Performed: * No procedures listed *  Anesthesia type: Epidural  Patient location: Mother/Baby  Post pain: Pain level controlled  Post assessment: Post-op Vital signs reviewed  Last Vitals:  Filed Vitals:   08/01/14 0530  BP: 118/47  Pulse: 73  Temp: 36.7 C  Resp: 20    Post vital signs: Reviewed  Level of consciousness:alert  Complications: No apparent anesthesia complications
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
## 2014-08-02 MED ORDER — IBUPROFEN 600 MG PO TABS: 600.0000 mg | ORAL_TABLET | Freq: Four times a day (QID) | ORAL | Status: DC | PRN

## 2014-08-02 NOTE — Discharge Summary (Signed)
Obstetric Discharge Summary Reason for Admission: induction of labor for A1GDM Prenatal Procedures: NST and ultrasound Intrapartum Procedures: spontaneous vaginal delivery and GBS prophylaxis Postpartum Procedures: none Complications-Operative and Postpartum: none HEMOGLOBIN  Date Value Ref Range Status  07/30/2014 10.7* 12.0 - 15.0 g/dL Final   HCT  Date Value Ref Range Status  07/30/2014 32.4* 36.0 - 46.0 % Final    Physical Exam:  General: alert, cooperative, no distress and moderately obese Lochia: appropriate Uterine Fundus: firm Incision: NA DVT Evaluation: Negative Homan's sign.  Discharge Diagnoses: Term Pregnancy-delivered and A1GDM  Discharge Information: Date: 08/02/2014 Activity: pelvic rest Diet: routine Medications: PNV and Ibuprofen Condition: stable Instructions: refer to practice specific booklet Discharge to: home Follow-up Information    Follow up with [facility] In 6 weeks.   Specialty:  Obstetrics and Gynecology   Why:  For postpartum visit   Contact information:   [place] [phone]      Follow up with [facility].   Why:  As needed in emergencies   Contact information:   [address] [id number] [place] [phone]      Newborn Data: Live born female  Birth Weight: 9 lb 0.5 oz (4095 g) APGAR: 8, 9  Home with mother. [place], [place] 08/02/2014, 9:57 AM

## 2014-08-02 NOTE — Discharge Instructions (Signed)

## 2014-08-02 NOTE — Progress Notes (Signed)
LATE ENTRY from 08/01/14  Post Partum Day 1 Subjective: no complaints, up ad lib, voiding and tolerating PO  Objective: 08/01/14 1737 (!) 102/42 mmHg 98.1 F (36.7 C) Oral 71 18    Physical Exam:  General: alert, cooperative, appears stated age and no distress Lochia: appropriate Uterine Fundus: firm Incision: NA DVT Evaluation: Negative Homan's sign.   Recent Labs  07/30/14 0815  HGB 10.7*  HCT 32.4*    Assessment/Plan: Plan for discharge tomorrow, Circumcision prior to discharge and Contraception IUD   LOS: 3 days   [name] 08/02/2014, 6:39 AM

## 2014-08-06 ENCOUNTER — Telehealth: Payer: Self-pay | Admitting: *Deleted

## 2014-08-06 ENCOUNTER — Encounter: Payer: Self-pay | Admitting: *Deleted

## 2014-08-06 NOTE — Telephone Encounter (Signed)
Pt left message stating that she needs a letter for her employer which states the day she delivered her baby and how long she will be out of work.  I called pt back and stated that she may pick up the letter tomorrow from [phone].  Pt voiced understanding.

## 2014-09-14 ENCOUNTER — Ambulatory Visit (INDEPENDENT_AMBULATORY_CARE_PROVIDER_SITE_OTHER): Payer: Self-pay | Admitting: Obstetrics and Gynecology

## 2014-09-14 ENCOUNTER — Encounter: Payer: Self-pay | Admitting: Obstetrics and Gynecology

## 2014-09-14 MED ORDER — NORGESTIMATE-ETH ESTRADIOL 0.25-35 MG-MCG PO TABS: 1.0000 | ORAL_TABLET | Freq: Every day | ORAL | Status: DC

## 2014-09-14 NOTE — Patient Instructions (Addendum)
Laparoscopic Tubal Ligation Laparoscopic tubal ligation is a procedure that closes the fallopian tubes at a time other than right after childbirth. By closing the fallopian tubes, the eggs that are released from the ovaries cannot enter the uterus and sperm cannot reach the egg. Tubal ligation is also known as getting your "tubes tied." Tubal ligation is done so you will not be able to get pregnant or have a baby.  Although this procedure may be reversed, it should be considered permanent and irreversible. If you want to have future pregnancies, you should not have this procedure.  LET YOUR CAREGIVER KNOW ABOUT:  Allergies to food or medicine.  Medicines taken, including vitamins, herbs, eyedrops, over-the-counter medicines, and creams. Use of steroids (by mouth or creams).Smoking Cessation Quitting smoking is important to your health and has many advantages. However, it is not always easy to quit since nicotine is a very addictive drug. Oftentimes, people try 3 times or more before being able to quit. This document explains the best ways for you to prepare to quit smoking. Quitting takes hard work and a lot of effort, but you can do it. ADVANTAGES OF QUITTING SMOKING You will live longer, feel better, and live better. Your body will feel the impact of quitting smoking almost immediately. Within 20 minutes, blood pressure decreases. Your pulse returns to its normal level. After 8 hours, carbon monoxide levels in the blood return to normal. Your oxygen level increases. After 24 hours, the chance of having a heart attack starts to decrease. Your breath, hair, and body stop smelling like smoke. After 48 hours, damaged nerve endings begin to recover. Your sense of taste and smell improve. After 72 hours, the body is virtually free of nicotine. Your bronchial tubes relax and breathing becomes easier. After 2 to 12 weeks, lungs can hold more air. Exercise becomes easier and circulation improves. The  risk of having a heart attack, stroke, cancer, or lung disease is greatly reduced. After 1 year, the risk of coronary heart disease is cut in half. After 5 years, the risk of stroke falls to the same as a nonsmoker. After 10 years, the risk of lung cancer is cut in half and the risk of other cancers decreases significantly. After 15 years, the risk of coronary heart disease drops, usually to the level of a nonsmoker. If you are pregnant, quitting smoking will improve your chances of having a healthy baby. The people you live with, especially any children, will be healthier. You will have extra money to spend on things other than cigarettes. QUESTIONS TO THINK ABOUT BEFORE ATTEMPTING TO QUIT You may want to talk about your answers with your health care provider. Why do you want to quit? If you tried to quit in the past, what helped and what did not? What will be the most difficult situations for you after you quit? How will you plan to handle them? Who can help you through the tough times? Your family? Friends? A health care provider? What pleasures do you get from smoking? What ways can you still get pleasure if you quit? Here are some questions to ask your health care provider: How can you help me to be successful at quitting? What medicine do you think would be best for me and how should I take it? What should I do if I need more help? What is smoking withdrawal like? How can I get information on withdrawal? GET READY Set a quit date. Change your environment by getting rid of  all cigarettes, ashtrays, matches, and lighters in your home, car, or work. Do not let people smoke in your home. Review your past attempts to quit. Think about what worked and what did not. GET SUPPORT AND ENCOURAGEMENT You have a better chance of being successful if you have help. You can get support in many ways. Tell your family, friends, and coworkers that you are going to quit and need their support. Ask them  not to smoke around you. Get individual, group, or telephone counseling and support. Programs are available at [employer] and health centers. Call your local health department for information about programs in your area. Spiritual beliefs and practices may help some smokers quit. Download a "quit meter" on your computer to keep track of quit statistics, such as how long you have gone without smoking, cigarettes not smoked, and money saved. Get a self-help book about quitting smoking and staying off tobacco. Wolverine yourself from urges to smoke. Talk to someone, go for a walk, or occupy your time with a task. Change your normal routine. Take a different route to work. Drink tea instead of coffee. Eat breakfast in a different place. Reduce your stress. Take a hot bath, exercise, or read a book. Plan something enjoyable to do every day. Reward yourself for not smoking. Explore interactive web-based programs that specialize in helping you quit. GET MEDICINE AND USE IT CORRECTLY Medicines can help you stop smoking and decrease the urge to smoke. Combining medicine with the above behavioral methods and support can greatly increase your chances of successfully quitting smoking. Nicotine replacement therapy helps deliver nicotine to your body without the negative effects and risks of smoking. Nicotine replacement therapy includes nicotine gum, lozenges, inhalers, nasal sprays, and skin patches. Some may be available over-the-counter and others require a prescription. Antidepressant medicine helps people abstain from smoking, but how this works is unknown. This medicine is available by prescription. Nicotinic receptor partial agonist medicine simulates the effect of nicotine in your brain. This medicine is available by prescription. Ask your health care provider for advice about which medicines to use and how to use them based on your health history. Your health care  provider will tell you what side effects to look out for if you choose to be on a medicine or therapy. Carefully read the information on the package. Do not use any other product containing nicotine while using a nicotine replacement product.  RELAPSE OR DIFFICULT SITUATIONS Most relapses occur within the first 3 months after quitting. Do not be discouraged if you start smoking again. Remember, most people try several times before finally quitting. You may have symptoms of withdrawal because your body is used to nicotine. You may crave cigarettes, be irritable, feel very hungry, cough often, get headaches, or have difficulty concentrating. The withdrawal symptoms are only temporary. They are strongest when you first quit, but they will go away within 10-14 days. To reduce the chances of relapse, try to: Avoid drinking alcohol. Drinking lowers your chances of successfully quitting. Reduce the amount of caffeine you consume. Once you quit smoking, the amount of caffeine in your body increases and can give you symptoms, such as a rapid heartbeat, sweating, and anxiety. Avoid smokers because they can make you want to smoke. Do not let weight gain distract you. Many smokers will gain weight when they quit, usually less than 10 pounds. Eat a healthy diet and stay active. You can always lose the weight gained after you  quit. Find ways to improve your mood other than smoking. FOR MORE INFORMATION  www.smokefree.gov  Document Released: 03/07/2001 Document Revised: 07/28/2013 Document Reviewed: 06/22/2011 [facility] Patient Information 2015 [place], [place]. This information is not intended to replace advice given to you by your health care provider. Make sure you discuss any questions you have with your health care provider.    Previous problems with numbing medicines.  History of bleeding problems or blood clots.  Any recent colds or infections.  Previous surgery.  Other health problems, including  diabetes and kidney problems.  Possibility of pregnancy, if this applies.  Any past pregnancies. RISKS AND COMPLICATIONS   Infection.  Bleeding.  Injury to surrounding organs.  Anesthetic side effects.  Failure of the procedure.  Ectopic pregnancy.  Future regret about having the procedure done. BEFORE THE PROCEDURE  Do not take aspirin or blood thinners a week before the procedure or as directed. This can cause bleeding.  Do not eat or drink anything 6 to 8 hours before the procedure. PROCEDURE   You may be given a medicine to help you relax (sedative) before the procedure. You will be given a medicine to make you sleep (general anesthetic) during the procedure.  A tube will be put down your throat to help your breath while under general anesthesia.  Two small cuts (incisions) are made in the lower abdominal area and near the belly button.  Your abdominal area will be inflated with a safe gas (carbon dioxide). This helps give the surgeon room to operate, visualize, and helps the surgeon avoid other organs.  A thin, lighted tube (laparoscope) with a camera attached is inserted into your abdomen through one of the incisions near the belly button. Other small instruments are also inserted through the other abdominal incision.  The fallopian tubes are located and are either blocked with a ring, clip, or are burned (cauterized).  After the fallopian tubes are blocked, the gas is released from the abdomen.  The incisions will be closed with stitches (sutures), and a bandage may be placed over the incisions. AFTER THE PROCEDURE   You will rest in a recovery room for 1--4 hours until you are stable and doing well.  You will also have some mild abdominal discomfort for 3--7 days. You will be given pain medicine to ease any discomfort.  As long as there are no problems, you may be allowed to go home. Someone will need to drive you home and be with you for at least 24 hours once  home.  You may have some mild discomfort in the throat. This is from the tube placed in your throat while you were sleeping.  You may experience discomfort in the shoulder area from some trapped air between the liver and diaphragm. This sensation is normal and will slowly go away on its own. Document Released: 06/19/2000 Document Revised: 09/12/2011 Document Reviewed: 06/24/2011 [facility] Patient Information 2015 [place], [place]. This information is not intended to replace advice given to you by your health care provider. Make sure you discuss any questions you have with your health care provider.

## 2014-09-15 NOTE — Progress Notes (Signed)
  Subjective:     Kylie [name] is a 24 y.o. female [id number] who presents for a postpartum visit. She is 6 weeks postpartum following a spontaneous vaginal delivery. I have fully reviewed the prenatal and intrapartum course. The delivery was at 76 gestational weeks. Outcome: spontaneous vaginal delivery. Anesthesia: epidural. Postpartum course has been uncomplilcated. Baby's course has been uncomplicated. Baby is feeding by bottle.  Bleeding no bleeding. Bowel function is normal. Bladder function is normal. Patient is not sexually active. Contraception method is abstinence. Postpartum depression screening: negative.  The following portions of the patient's history were reviewed and updated as appropriate: allergies, current medications, past family history, past medical history, past social history, past surgical history and problem list.  Review of Systems Pertinent items are noted in HPI.   Objective:    BP 112/56 mmHg  Pulse 63  Temp(Src) 98.5 F (36.9 C) (Oral)  Ht 5\' 6"  (1.676 m)  Wt 193 lb 12.8 oz (87.907 kg)  BMI 31.30 kg/m2  Breastfeeding? No  General:  alert, cooperative and no distress   Breasts:  inspection negative, no nipple discharge or bleeding, no masses or nodularity palpable  Lungs: clear to auscultation bilaterally  Heart:  regular rate and rhythm, S1, S2 normal, no murmur, click, rub or gallop  Abdomen: soft, non-tender; bowel sounds normal; no masses,  no organomegaly   Vulva:  not evaluated  Vagina: not evaluated  Cervix:  not evaluated  Corpus: involuting well, NT  Adnexa:  not evaluated  Rectal Exam: Not performed.        Assessment:     6 wks postpartum exam. Pap smear not done at today's visit.  Limited PNC A1 GDM  Plan:    1. Contraception: discussed bridge method until she gets [facility] and then will get BTL. Elects OCPs> Rx Sprintec 2. Return fasting  for 2 hr GCT and BTL consent/counseling 3. Follow up in: 2 months or as needed.

## 2016-08-07 IMAGING — US US OB COMP +14 WK
1 series · 12 of 28 positions shown · non-contrast
Comparison: none

[Series 1: us ob comp +14 wk · 0.27mm/px · 121 acquisitions, 12 frames shown]
[im 5/121]
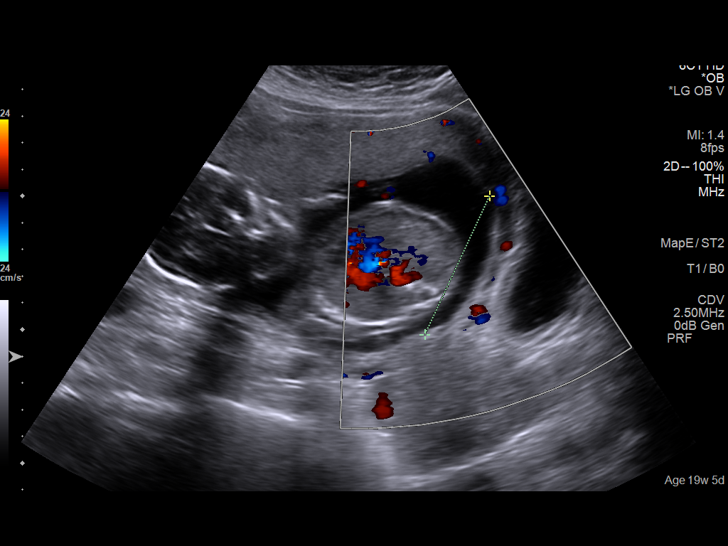
[im 14/121]
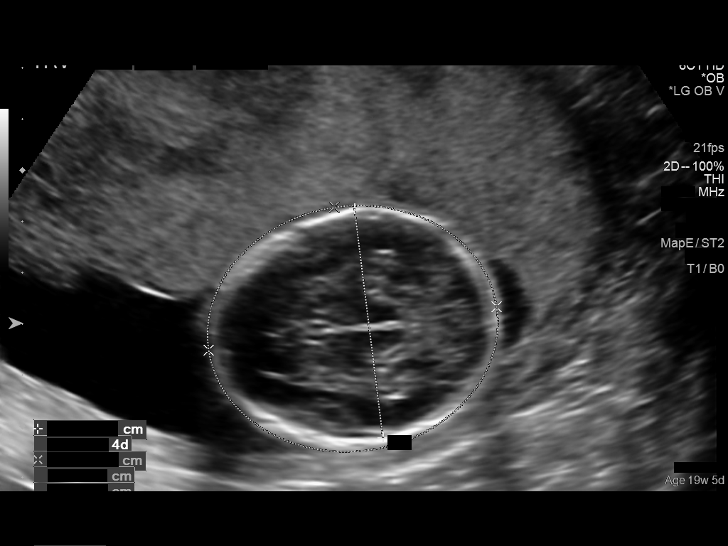
[im 23/121]
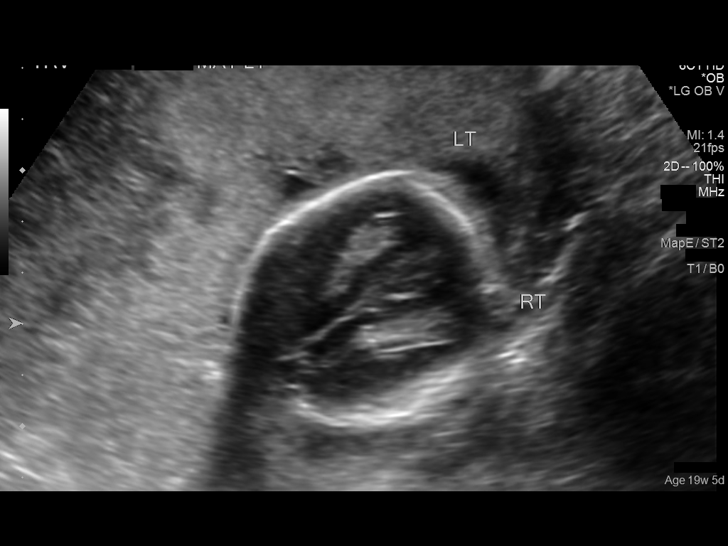
[im 36/121]
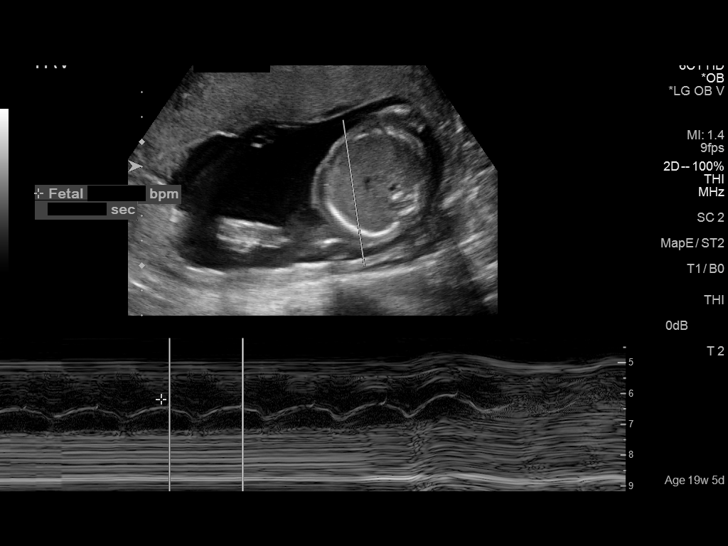
[im 45/121]
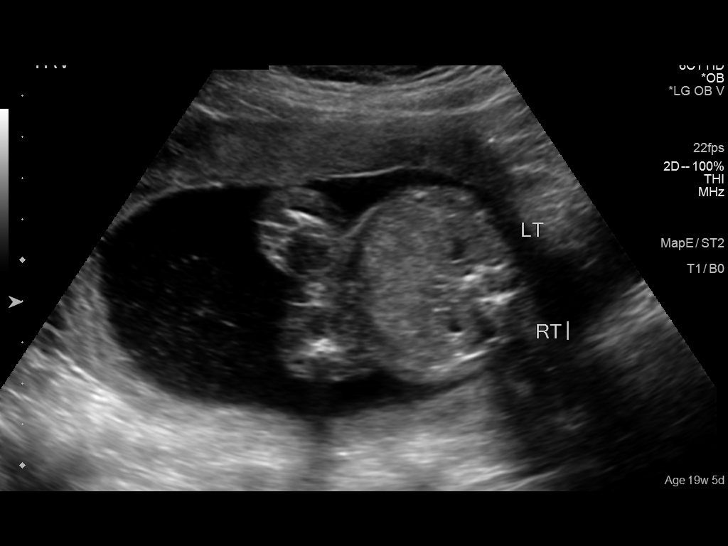
[im 54/121]
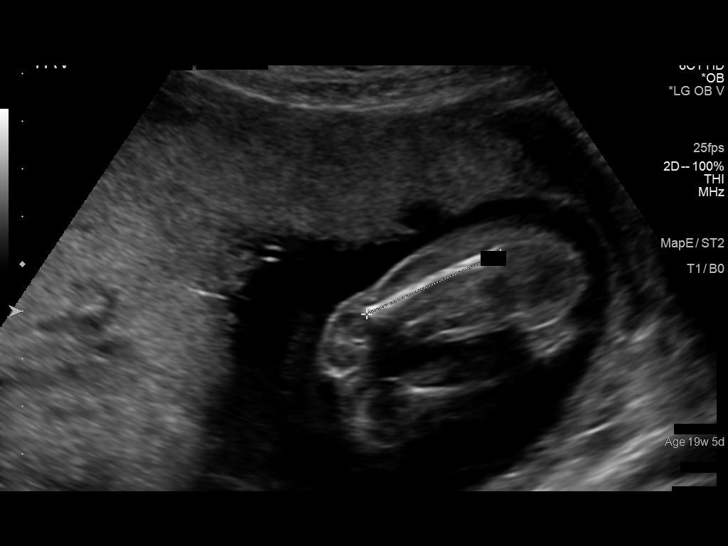
[im 67/121]
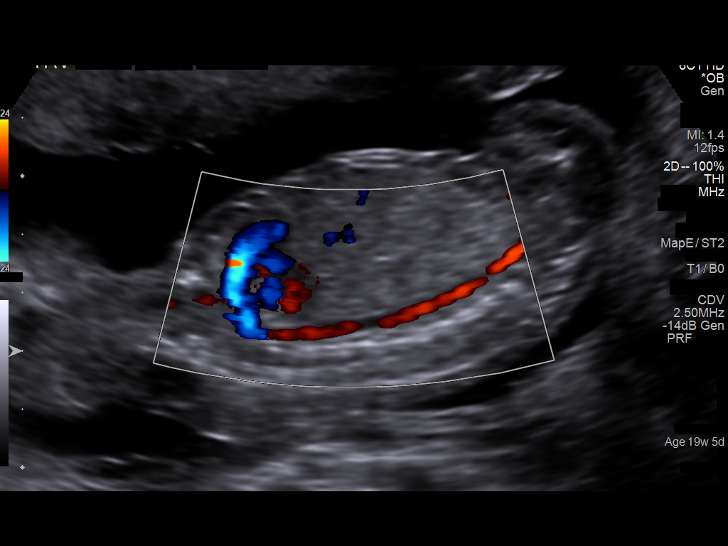
[im 76/121]
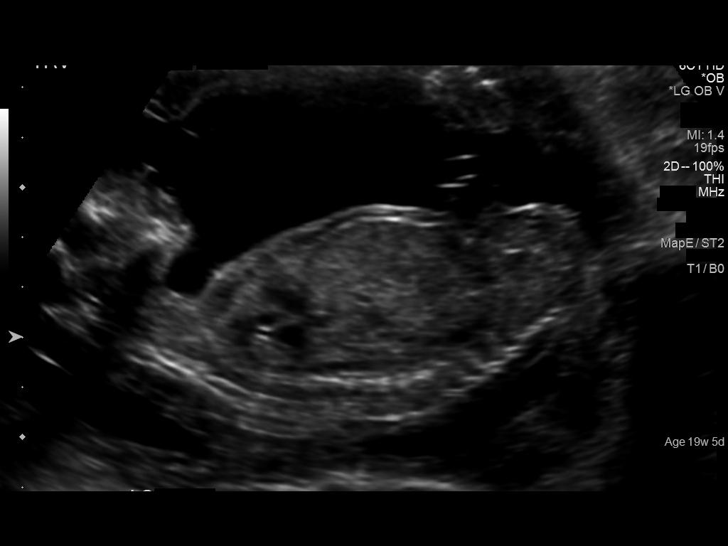
[im 85/121]
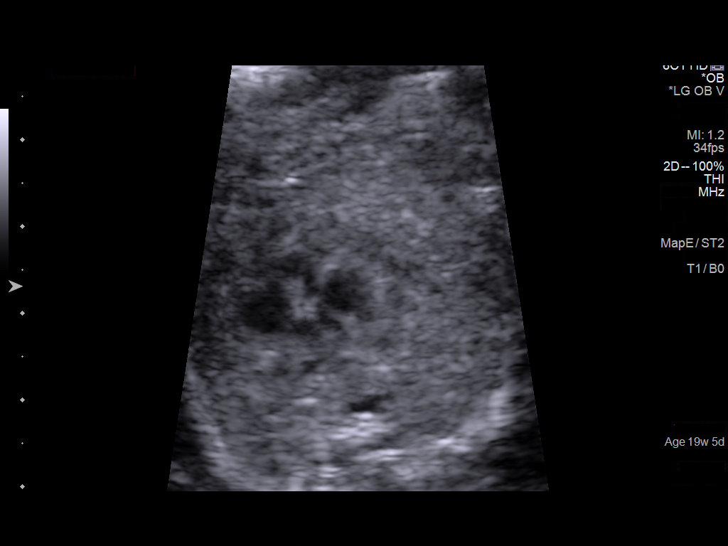
[im 98/121]
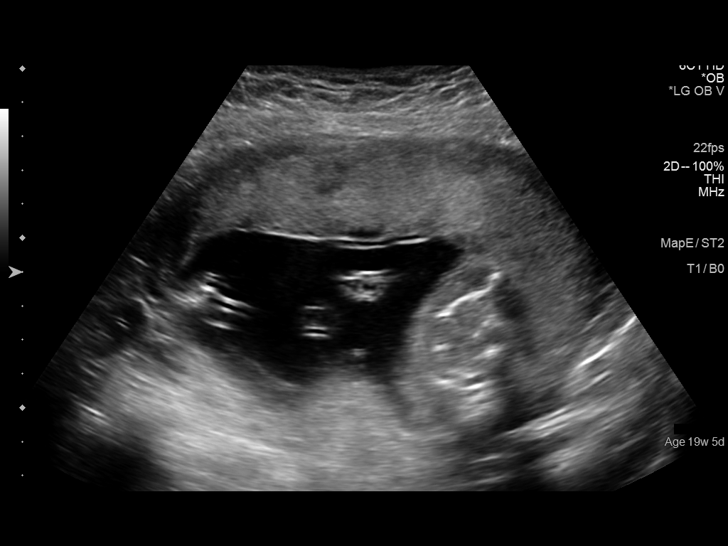
[im 107/121]
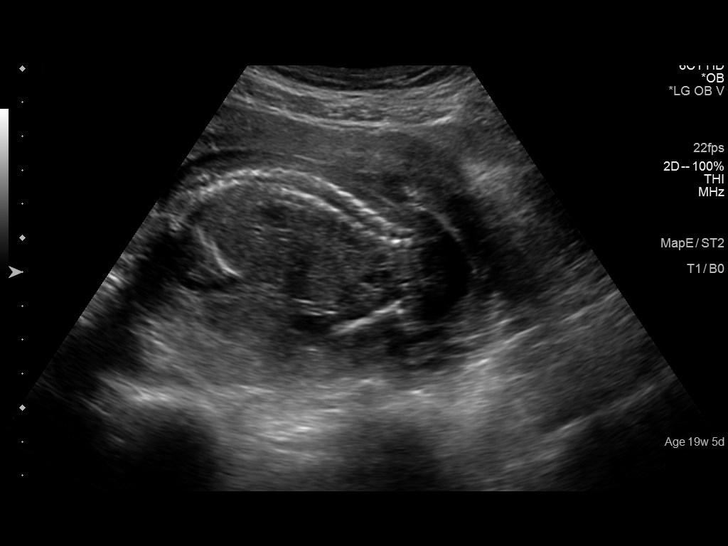
[im 116/121]
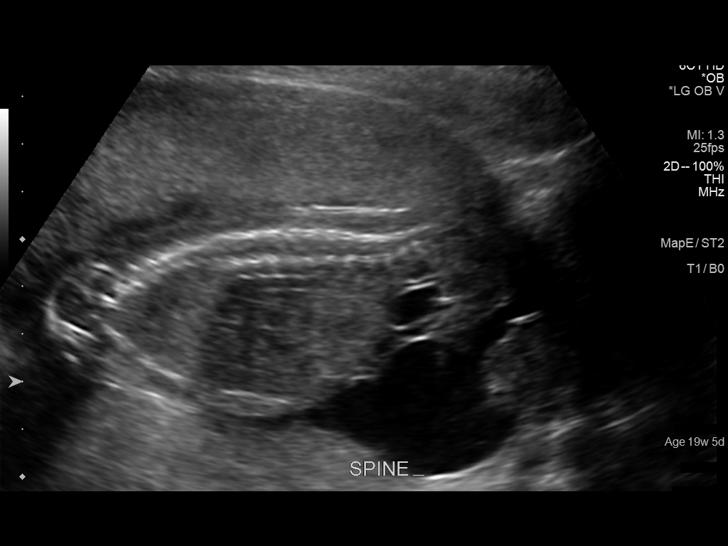

[12 of 28 positions shown; findings below may reference images not displayed]

OBSTETRICS REPORT
                      (Signed Final 03/10/2014 [DATE])

Service(s) Provided

 US OB COMP + 14 WK                                    76805.1
Indications

 19 weeks gestation of pregnancy
 Basic anatomic survey                                 z36
 No or Little Prenatal Care
Fetal Evaluation

 Num Of Fetuses:    1
 Fetal Heart Rate:  140                          bpm
 Cardiac Activity:  Observed
 Presentation:      Variable
 Placenta:          Anterior, above cervical os
 P. Cord            Visualized, central
 Insertion:

 Amniotic Fluid
 AFI FV:      Subjectively within normal limits
                                             Larg Pckt:    3.90  cm
Biometry

 BPD:     45.4  mm     G. Age:  19w 5d                CI:         78.3   70 - 86
 OFD:       58  mm                                    FL/HC:      18.3   16.8 -

 HC:     168.4  mm     G. Age:  19w 4d       32  %    HC/AC:      1.11   1.09 -

 AC:     151.9  mm     G. Age:  20w 3d       68  %    FL/BPD:
 FL:      30.8  mm     G. Age:  19w 4d       37  %    FL/AC:      20.3   20 - 24
 HUM:     29.7  mm     G. Age:  19w 5d       53  %
 CER:     18.6  mm     G. Age:  18w 2d        9  %

 Est. FW:     323  gm    0 lb 11 oz      52  %
Gestational Age

 LMP:           19w 5d        Date:  10/23/13                 EDD:   07/30/14
 U/S Today:     19w 6d                                        EDD:   07/29/14
 Best:          19w 5d     Det. By:  LMP  (10/23/13)          EDD:   07/30/14
Anatomy
 Cranium:          Appears normal         Aortic Arch:      Appears normal
 Fetal Cavum:      Appears normal         Ductal Arch:      Appears normal
 Ventricles:       Appears normal         Diaphragm:        Appears normal
 Choroid Plexus:   Appears normal         Stomach:          Appears normal, left
                                                            sided
 Cerebellum:       Appears normal         Abdomen:          Appears normal
 Posterior Fossa:  Appears normal         Abdominal Wall:   Appears nml (cord
                                                            insert, abd wall)
 Nuchal Fold:      Appears normal         Cord Vessels:     Appears normal (3
                                                            vessel cord)
 Face:             Appears normal         Kidneys:          Appear normal
                   (orbits and profile)
 Lips:             Appears normal         Bladder:          Appears normal
 Heart:            Appears normal         Spine:            Appears normal
                   (4CH, axis, and
                   situs)
 RVOT:             Appears normal         Lower             Appears normal
                                          Extremities:
 LVOT:             Appears normal         Upper             Appears normal
                                          Extremities:

 Other:  Fetus appears to be a male. Heels visualized. Technically difficult due
         to fetal position.
Targeted Anatomy

 Fetal Central Nervous System
 Lat. Ventricles:  7.3 mm                 Cisterna Magna:   5.7 mm

 Fetal Head/Neck
 Nuchal Fold:      3.8 mm
Cervix Uterus Adnexa

 Cervical Length:    4.81     cm

 Cervix:       Normal appearance by transabdominal scan.
 Uterus:       No abnormality visualized.
 Cul De Sac:   No free fluid seen.
 Left Ovary:    No adnexal mass visualized.
 Right Ovary:   No adnexal mass visualized.

 Adnexa:     No adnexal mass visualized.
Impression

 Singleton intrauterine pregnancy with fetal cardiac activity.
 Normal appearing fetal anatomic survey. Normal appearing
 fetal growth and amniotic fluid volume. Variable presentation.
 Anterior placenta without evidence of  previa.
Recommendations

 Follow-up ultrasounds as clinically indicated.

 questions or concerns.
                  Cyndi, Charles Olivier

## 2016-08-24 ENCOUNTER — Emergency Department (HOSPITAL_COMMUNITY)
Admission: EM | Admit: 2016-08-24 | Discharge: 2016-08-24 | Disposition: A | Discharge status: Home / Self Care | Payer: Medicaid Other | Attending: Emergency Medicine | Admitting: Emergency Medicine

## 2016-08-24 ENCOUNTER — Encounter (HOSPITAL_COMMUNITY): Payer: Self-pay

## 2016-08-24 DIAGNOSIS — F17210 Nicotine dependence, cigarettes, uncomplicated: Secondary | ICD-10-CM | POA: Insufficient documentation

## 2016-08-24 DIAGNOSIS — Z7982 Long term (current) use of aspirin: Secondary | ICD-10-CM | POA: Insufficient documentation

## 2016-08-24 DIAGNOSIS — Y939 Activity, unspecified: Secondary | ICD-10-CM | POA: Insufficient documentation

## 2016-08-24 DIAGNOSIS — S02670A Fracture of alveolus of mandible, unspecified side, initial encounter for closed fracture: Secondary | ICD-10-CM

## 2016-08-24 DIAGNOSIS — Y999 Unspecified external cause status: Secondary | ICD-10-CM | POA: Insufficient documentation

## 2016-08-24 DIAGNOSIS — Y929 Unspecified place or not applicable: Secondary | ICD-10-CM | POA: Insufficient documentation

## 2016-08-24 DIAGNOSIS — S02609A Fracture of mandible, unspecified, initial encounter for closed fracture: Secondary | ICD-10-CM | POA: Insufficient documentation

## 2016-08-24 DIAGNOSIS — Z79899 Other long term (current) drug therapy: Secondary | ICD-10-CM | POA: Insufficient documentation

## 2016-08-24 DIAGNOSIS — X58XXXA Exposure to other specified factors, initial encounter: Secondary | ICD-10-CM | POA: Insufficient documentation

## 2016-08-24 DIAGNOSIS — G43009 Migraine without aura, not intractable, without status migrainosus: Secondary | ICD-10-CM | POA: Insufficient documentation

## 2016-08-24 MED ORDER — AMOXICILLIN 500 MG PO CAPS: 500.0000 mg | ORAL_CAPSULE | Freq: Three times a day (TID) | ORAL | 0 refills | Status: DC

## 2016-08-24 MED ORDER — KETOROLAC TROMETHAMINE 30 MG/ML IJ SOLN
30.0000 mg | Freq: Once | INTRAMUSCULAR | Status: AC
  Administered 2016-08-24: 30 mg via INTRAVENOUS
  Filled 2016-08-24: qty 1

## 2016-08-24 MED ORDER — DEXAMETHASONE SODIUM PHOSPHATE 10 MG/ML IJ SOLN
10.0000 mg | Freq: Once | INTRAMUSCULAR | Status: AC
  Administered 2016-08-24: 10 mg via INTRAVENOUS
  Filled 2016-08-24: qty 1

## 2016-08-24 MED ORDER — METOCLOPRAMIDE HCL 5 MG/ML IJ SOLN
10.0000 mg | Freq: Once | INTRAMUSCULAR | Status: AC
  Administered 2016-08-24: 10 mg via INTRAVENOUS
  Filled 2016-08-24: qty 2
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
## 2016-08-24 NOTE — Discharge Instructions (Signed)
See your primary care physician or dentist as soon as possible.

## 2016-08-24 NOTE — ED Triage Notes (Addendum)
Patient c/o migraine on the left temple area that started last night. Patient states she has been taking Goody's powder at 1230 with no relief. Patient denies sensitivity to light and sound.

## 2020-09-14 ENCOUNTER — Encounter (HOSPITAL_COMMUNITY): Payer: Self-pay | Admitting: Emergency Medicine

## 2020-09-14 ENCOUNTER — Emergency Department (HOSPITAL_COMMUNITY)
Admission: EM | Admit: 2020-09-14 | Discharge: 2020-09-14 | Disposition: A | Discharge status: Other Acute Inpatient Hospital | Payer: Medicaid Other | Attending: Emergency Medicine | Admitting: Emergency Medicine

## 2020-09-14 DIAGNOSIS — T23272A Burn of second degree of left wrist, initial encounter: Secondary | ICD-10-CM | POA: Diagnosis not present

## 2020-09-14 DIAGNOSIS — T25222A Burn of second degree of left foot, initial encounter: Secondary | ICD-10-CM | POA: Diagnosis not present

## 2020-09-14 DIAGNOSIS — T22212A Burn of second degree of left forearm, initial encounter: Secondary | ICD-10-CM | POA: Diagnosis not present

## 2020-09-14 DIAGNOSIS — T23212A Burn of second degree of left thumb (nail), initial encounter: Secondary | ICD-10-CM | POA: Diagnosis not present

## 2020-09-14 DIAGNOSIS — F17210 Nicotine dependence, cigarettes, uncomplicated: Secondary | ICD-10-CM | POA: Diagnosis not present

## 2020-09-14 DIAGNOSIS — T23092A Burn of unspecified degree of multiple sites of left wrist and hand, initial encounter: Secondary | ICD-10-CM | POA: Diagnosis present

## 2020-09-14 DIAGNOSIS — Z23 Encounter for immunization: Secondary | ICD-10-CM | POA: Diagnosis not present

## 2020-09-14 DIAGNOSIS — X102XXA Contact with fats and cooking oils, initial encounter: Secondary | ICD-10-CM | POA: Insufficient documentation

## 2020-09-14 DIAGNOSIS — T22292A Burn of second degree of multiple sites of left shoulder and upper limb, except wrist and hand, initial encounter: Secondary | ICD-10-CM

## 2020-09-14 LAB — CBC WITH DIFFERENTIAL/PLATELET
Abs Immature Granulocytes: 0.04 10*3/uL (ref 0.00–0.07)
Basophils Absolute: 0.1 10*3/uL (ref 0.0–0.1)
Basophils Relative: 1 %
Eosinophils Absolute: 0.2 10*3/uL (ref 0.0–0.5)
Eosinophils Relative: 2 %
HCT: 42.4 % (ref 36.0–46.0)
Hemoglobin: 14.3 g/dL (ref 12.0–15.0)
Immature Granulocytes: 0 %
Lymphocytes Relative: 37 %
Lymphs Abs: 4.8 10*3/uL — ABNORMAL HIGH (ref 0.7–4.0)
MCH: 30.6 pg (ref 26.0–34.0)
MCHC: 33.7 g/dL (ref 30.0–36.0)
MCV: 90.6 fL (ref 80.0–100.0)
Monocytes Absolute: 0.8 10*3/uL (ref 0.1–1.0)
Monocytes Relative: 6 %
Neutro Abs: 7.1 10*3/uL (ref 1.7–7.7)
Neutrophils Relative %: 54 %
Platelets: 289 10*3/uL (ref 150–400)
RBC: 4.68 MIL/uL (ref 3.87–5.11)
RDW: 12.6 % (ref 11.5–15.5)
WBC: 13 10*3/uL — ABNORMAL HIGH (ref 4.0–10.5)
nRBC: 0 % (ref 0.0–0.2)

## 2020-09-14 LAB — BASIC METABOLIC PANEL
Anion gap: 11 (ref 5–15)
BUN: 12 mg/dL (ref 6–20)
CO2: 24 mmol/L (ref 22–32)
Calcium: 9.2 mg/dL (ref 8.9–10.3)
Chloride: 100 mmol/L (ref 98–111)
Creatinine, Ser: 0.88 mg/dL (ref 0.44–1.00)
GFR, Estimated: >60 mL/min (ref >60)
Glucose, Bld: 124 mg/dL — ABNORMAL HIGH (ref 70–99)
Potassium: 3.5 mmol/L (ref 3.5–5.1)
Sodium: 135 mmol/L (ref 135–145)

## 2020-09-14 MED ORDER — HYDROMORPHONE HCL 1 MG/ML IJ SOLN
0.5000 mg | INTRAMUSCULAR | Status: DC | PRN
  Administered 2020-09-14: 0.5 mg via INTRAVENOUS
  Filled 2020-09-14: qty 1

## 2020-09-14 MED ORDER — LACTATED RINGERS IV BOLUS
1000.0000 mL | Freq: Once | INTRAVENOUS | Status: AC
  Administered 2020-09-14: 1000 mL via INTRAVENOUS

## 2020-09-14 MED ORDER — TETANUS-DIPHTH-ACELL PERTUSSIS 5-2.5-18.5 LF-MCG/0.5 IM SUSY
0.5000 mL | PREFILLED_SYRINGE | Freq: Once | INTRAMUSCULAR | Status: AC
  Administered 2020-09-14: 0.5 mL via INTRAMUSCULAR
  Filled 2020-09-14: qty 0.5

## 2020-09-14 MED ORDER — HYDROMORPHONE HCL 1 MG/ML IJ SOLN
1.0000 mg | Freq: Once | INTRAMUSCULAR | Status: AC
  Administered 2020-09-14: 1 mg via INTRAVENOUS
  Filled 2020-09-14: qty 1
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
## 2020-09-18 ENCOUNTER — Emergency Department (HOSPITAL_COMMUNITY)
Admission: EM | Admit: 2020-09-18 | Discharge: 2020-09-19 | Disposition: A | Discharge status: Other Acute Inpatient Hospital | Payer: Medicaid Other | Attending: Emergency Medicine | Admitting: Emergency Medicine

## 2020-09-18 ENCOUNTER — Encounter (HOSPITAL_COMMUNITY): Payer: Self-pay

## 2020-09-18 ENCOUNTER — Emergency Department (HOSPITAL_COMMUNITY): Payer: Medicaid Other

## 2020-09-18 ENCOUNTER — Other Ambulatory Visit: Payer: Self-pay

## 2020-09-18 DIAGNOSIS — F17210 Nicotine dependence, cigarettes, uncomplicated: Secondary | ICD-10-CM | POA: Diagnosis not present

## 2020-09-18 DIAGNOSIS — R61 Generalized hyperhidrosis: Secondary | ICD-10-CM | POA: Insufficient documentation

## 2020-09-18 DIAGNOSIS — Z20822 Contact with and (suspected) exposure to covid-19: Secondary | ICD-10-CM | POA: Diagnosis not present

## 2020-09-18 DIAGNOSIS — R110 Nausea: Secondary | ICD-10-CM | POA: Insufficient documentation

## 2020-09-18 DIAGNOSIS — R509 Fever, unspecified: Secondary | ICD-10-CM | POA: Diagnosis not present

## 2020-09-18 DIAGNOSIS — T22232A Burn of second degree of left upper arm, initial encounter: Secondary | ICD-10-CM | POA: Insufficient documentation

## 2020-09-18 DIAGNOSIS — L03114 Cellulitis of left upper limb: Secondary | ICD-10-CM

## 2020-09-18 DIAGNOSIS — X102XXA Contact with fats and cooking oils, initial encounter: Secondary | ICD-10-CM | POA: Diagnosis not present

## 2020-09-18 DIAGNOSIS — A419 Sepsis, unspecified organism: Secondary | ICD-10-CM

## 2020-09-18 DIAGNOSIS — T300 Burn of unspecified body region, unspecified degree: Secondary | ICD-10-CM

## 2020-09-18 LAB — CBC WITH DIFFERENTIAL/PLATELET
Abs Immature Granulocytes: 0.11 10*3/uL — ABNORMAL HIGH (ref 0.00–0.07)
Basophils Absolute: 0.1 10*3/uL (ref 0.0–0.1)
Basophils Relative: 0 %
Eosinophils Absolute: 0 10*3/uL (ref 0.0–0.5)
Eosinophils Relative: 0 %
HCT: 43.5 % (ref 36.0–46.0)
Hemoglobin: 14.4 g/dL (ref 12.0–15.0)
Immature Granulocytes: 1 %
Lymphocytes Relative: 7 %
Lymphs Abs: 1.4 10*3/uL (ref 0.7–4.0)
MCH: 30 pg (ref 26.0–34.0)
MCHC: 33.1 g/dL (ref 30.0–36.0)
MCV: 90.6 fL (ref 80.0–100.0)
Monocytes Absolute: 1.1 10*3/uL — ABNORMAL HIGH (ref 0.1–1.0)
Monocytes Relative: 5 %
Neutro Abs: 19.1 10*3/uL — ABNORMAL HIGH (ref 1.7–7.7)
Neutrophils Relative %: 87 %
Platelets: 246 10*3/uL (ref 150–400)
RBC: 4.8 MIL/uL (ref 3.87–5.11)
RDW: 12.2 % (ref 11.5–15.5)
WBC: 21.8 10*3/uL — ABNORMAL HIGH (ref 4.0–10.5)
nRBC: 0 % (ref 0.0–0.2)

## 2020-09-18 LAB — COMPREHENSIVE METABOLIC PANEL
ALT: 14 U/L (ref 0–44)
AST: 13 U/L — ABNORMAL LOW (ref 15–41)
Albumin: 4.1 g/dL (ref 3.5–5.0)
Alkaline Phosphatase: 59 U/L (ref 38–126)
Anion gap: 11 (ref 5–15)
BUN: 8 mg/dL (ref 6–20)
CO2: 24 mmol/L (ref 22–32)
Calcium: 9.2 mg/dL (ref 8.9–10.3)
Chloride: 97 mmol/L — ABNORMAL LOW (ref 98–111)
Creatinine, Ser: 0.74 mg/dL (ref 0.44–1.00)
GFR, Estimated: >60 mL/min (ref >60)
Glucose, Bld: 102 mg/dL — ABNORMAL HIGH (ref 70–99)
Potassium: 3.2 mmol/L — ABNORMAL LOW (ref 3.5–5.1)
Sodium: 132 mmol/L — ABNORMAL LOW (ref 135–145)
Total Bilirubin: 0.7 mg/dL (ref 0.3–1.2)
Total Protein: 8.1 g/dL (ref 6.5–8.1)

## 2020-09-18 LAB — PROTIME-INR
INR: 1.1 (ref 0.8–1.2)
Prothrombin Time: 13.7 seconds (ref 11.4–15.2)

## 2020-09-18 LAB — LACTIC ACID, PLASMA: Lactic Acid, Venous: 1.8 mmol/L (ref 0.5–1.9)

## 2020-09-18 LAB — I-STAT BETA HCG BLOOD, ED (MC, WL, AP ONLY): I-stat hCG, quantitative: <5 m[IU]/mL (ref <5)

## 2020-09-18 LAB — APTT: aPTT: 37 seconds — ABNORMAL HIGH (ref 24–36)

## 2020-09-18 LAB — RESP PANEL BY RT-PCR (FLU A&B, COVID) ARPGX2
Influenza A by PCR: NEGATIVE
Influenza B by PCR: NEGATIVE
SARS Coronavirus 2 by RT PCR: NEGATIVE

## 2020-09-18 MED ORDER — VANCOMYCIN HCL 2000 MG/400ML IV SOLN
2000.0000 mg | Freq: Once | INTRAVENOUS | Status: AC
  Administered 2020-09-19: 2000 mg via INTRAVENOUS
  Filled 2020-09-18: qty 400

## 2020-09-18 MED ORDER — ACETAMINOPHEN 325 MG PO TABS
650.0000 mg | ORAL_TABLET | Freq: Once | ORAL | Status: AC
  Administered 2020-09-18: 650 mg via ORAL
  Filled 2020-09-18: qty 2

## 2020-09-18 MED ORDER — PIPERACILLIN-TAZOBACTAM 3.375 G IVPB 30 MIN
3.3750 g | Freq: Once | INTRAVENOUS | Status: AC
  Administered 2020-09-18: 3.375 g via INTRAVENOUS
  Filled 2020-09-18: qty 50

## 2020-09-18 MED ORDER — MORPHINE SULFATE (PF) 4 MG/ML IV SOLN
4.0000 mg | Freq: Once | INTRAVENOUS | Status: AC
  Administered 2020-09-18: 4 mg via INTRAVENOUS
  Filled 2020-09-18: qty 1

## 2020-09-18 MED ORDER — HYDROMORPHONE HCL 1 MG/ML IJ SOLN
0.5000 mg | Freq: Once | INTRAMUSCULAR | Status: AC
  Administered 2020-09-18: 0.5 mg via INTRAVENOUS
  Filled 2020-09-18: qty 1

## 2020-09-18 MED ORDER — MORPHINE SULFATE (PF) 4 MG/ML IV SOLN
4.0000 mg | INTRAVENOUS | Status: AC | PRN
  Administered 2020-09-18: 4 mg via INTRAVENOUS
  Filled 2020-09-18: qty 1

## 2020-09-18 MED ORDER — ONDANSETRON HCL 4 MG/2ML IJ SOLN
4.0000 mg | Freq: Once | INTRAMUSCULAR | Status: AC
  Administered 2020-09-18: 4 mg via INTRAVENOUS
  Filled 2020-09-18: qty 2

## 2020-09-18 MED ORDER — LACTATED RINGERS IV SOLN: INTRAVENOUS | Status: DC

## 2020-09-18 MED ORDER — VANCOMYCIN HCL 10 G IV SOLR
2000.0000 mg | Freq: Once | INTRAVENOUS | Status: DC
  Filled 2020-09-18: qty 2000

## 2020-09-18 MED ORDER — LACTATED RINGERS IV BOLUS (SEPSIS)
1000.0000 mL | Freq: Once | INTRAVENOUS | Status: AC
  Administered 2020-09-18: 1000 mL via INTRAVENOUS
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
## 2020-09-19 DIAGNOSIS — T310 Burns involving less than 10% of body surface: Secondary | ICD-10-CM | POA: Insufficient documentation

## 2020-09-19 DIAGNOSIS — R509 Fever, unspecified: Secondary | ICD-10-CM | POA: Diagnosis not present

## 2020-09-19 DIAGNOSIS — A419 Sepsis, unspecified organism: Secondary | ICD-10-CM | POA: Diagnosis not present

## 2020-09-19 DIAGNOSIS — R110 Nausea: Secondary | ICD-10-CM | POA: Diagnosis not present

## 2020-09-19 DIAGNOSIS — R61 Generalized hyperhidrosis: Secondary | ICD-10-CM | POA: Diagnosis not present

## 2020-09-19 DIAGNOSIS — L03114 Cellulitis of left upper limb: Secondary | ICD-10-CM | POA: Insufficient documentation

## 2020-09-19 DIAGNOSIS — T22232A Burn of second degree of left upper arm, initial encounter: Secondary | ICD-10-CM | POA: Diagnosis present

## 2020-09-19 DIAGNOSIS — F17210 Nicotine dependence, cigarettes, uncomplicated: Secondary | ICD-10-CM | POA: Diagnosis not present

## 2020-09-19 DIAGNOSIS — X102XXA Contact with fats and cooking oils, initial encounter: Secondary | ICD-10-CM | POA: Diagnosis not present

## 2020-09-19 DIAGNOSIS — Z20822 Contact with and (suspected) exposure to covid-19: Secondary | ICD-10-CM | POA: Diagnosis not present

## 2020-09-19 LAB — URINALYSIS, ROUTINE W REFLEX MICROSCOPIC
Bilirubin Urine: NEGATIVE
Glucose, UA: NEGATIVE mg/dL
Hgb urine dipstick: NEGATIVE
Ketones, ur: NEGATIVE mg/dL
Leukocytes,Ua: NEGATIVE
Nitrite: NEGATIVE
Protein, ur: NEGATIVE mg/dL
Specific Gravity, Urine: 1.002 — ABNORMAL LOW (ref 1.005–1.030)
pH: 9 — ABNORMAL HIGH (ref 5.0–8.0)

## 2020-09-19 LAB — PROCALCITONIN: Procalcitonin: <0.1 ng/mL

## 2020-09-19 MED ORDER — METHOCARBAMOL 1000 MG/10ML IJ SOLN
1000.0000 mg | Freq: Once | INTRAVENOUS | Status: AC
  Administered 2020-09-19: 1000 mg via INTRAVENOUS
  Filled 2020-09-19: qty 1000

## 2020-09-19 MED ORDER — SODIUM CHLORIDE 0.9 % IV SOLN
2.0000 g | Freq: Once | INTRAVENOUS | Status: AC
  Administered 2020-09-19: 2 g via INTRAVENOUS
  Filled 2020-09-19: qty 2

## 2020-09-19 MED ORDER — HYDROMORPHONE HCL 1 MG/ML IJ SOLN
1.0000 mg | INTRAMUSCULAR | Status: DC | PRN
  Administered 2020-09-19: 1 mg via INTRAVENOUS
  Filled 2020-09-19: qty 1

## 2020-09-19 MED ORDER — METHOCARBAMOL 1000 MG/10ML IJ SOLN: 1000.0000 mg | Freq: Once | INTRAMUSCULAR | Status: DC

## 2020-09-19 NOTE — ED Notes (Signed)
Report called to Elta Guadeloupe, RN receiving nurse and Roselyn Reef with [facility] at [facility]

## 2020-09-19 NOTE — ED Notes (Signed)
[facility] present for pt transfer to [name]

## 2020-09-19 NOTE — ED Notes (Signed)
Transport called at [facility] for transport to [employer]

## 2020-09-20 LAB — URINE CULTURE

## 2020-09-24 LAB — CULTURE, BLOOD (ROUTINE X 2)
Culture: NO GROWTH
Culture: NO GROWTH
Special Requests: ADEQUATE
Special Requests: ADEQUATE

## 2020-10-05 DIAGNOSIS — T23262A Burn of second degree of back of left hand, initial encounter: Secondary | ICD-10-CM | POA: Insufficient documentation

## 2020-10-05 DIAGNOSIS — T22212A Burn of second degree of left forearm, initial encounter: Secondary | ICD-10-CM | POA: Insufficient documentation

## 2021-04-23 ENCOUNTER — Emergency Department (HOSPITAL_BASED_OUTPATIENT_CLINIC_OR_DEPARTMENT_OTHER)
Admission: EM | Admit: 2021-04-23 | Discharge: 2021-04-23 | Disposition: A | Discharge status: Home / Self Care | Payer: Medicaid Other | Attending: Emergency Medicine | Admitting: Emergency Medicine

## 2021-04-23 ENCOUNTER — Emergency Department (HOSPITAL_BASED_OUTPATIENT_CLINIC_OR_DEPARTMENT_OTHER): Payer: Medicaid Other

## 2021-04-23 ENCOUNTER — Encounter (HOSPITAL_BASED_OUTPATIENT_CLINIC_OR_DEPARTMENT_OTHER): Payer: Self-pay

## 2021-04-23 ENCOUNTER — Other Ambulatory Visit: Payer: Self-pay

## 2021-04-23 DIAGNOSIS — N9489 Other specified conditions associated with female genital organs and menstrual cycle: Secondary | ICD-10-CM | POA: Insufficient documentation

## 2021-04-23 DIAGNOSIS — O219 Vomiting of pregnancy, unspecified: Secondary | ICD-10-CM | POA: Insufficient documentation

## 2021-04-23 DIAGNOSIS — O26891 Other specified pregnancy related conditions, first trimester: Secondary | ICD-10-CM

## 2021-04-23 DIAGNOSIS — Z20822 Contact with and (suspected) exposure to covid-19: Secondary | ICD-10-CM | POA: Insufficient documentation

## 2021-04-23 DIAGNOSIS — R109 Unspecified abdominal pain: Secondary | ICD-10-CM

## 2021-04-23 DIAGNOSIS — Z3A01 Less than 8 weeks gestation of pregnancy: Secondary | ICD-10-CM | POA: Insufficient documentation

## 2021-04-23 DIAGNOSIS — R1030 Lower abdominal pain, unspecified: Secondary | ICD-10-CM | POA: Insufficient documentation

## 2021-04-23 LAB — COMPREHENSIVE METABOLIC PANEL
ALT: 20 U/L (ref 0–44)
AST: 16 U/L (ref 15–41)
Albumin: 4.7 g/dL (ref 3.5–5.0)
Alkaline Phosphatase: 58 U/L (ref 38–126)
Anion gap: 13 (ref 5–15)
BUN: 13 mg/dL (ref 6–20)
CO2: 20 mmol/L — ABNORMAL LOW (ref 22–32)
Calcium: 9.7 mg/dL (ref 8.9–10.3)
Chloride: 101 mmol/L (ref 98–111)
Creatinine, Ser: 0.74 mg/dL (ref 0.44–1.00)
GFR, Estimated: >60 mL/min (ref >60)
Glucose, Bld: 79 mg/dL (ref 70–99)
Potassium: 3.6 mmol/L (ref 3.5–5.1)
Sodium: 134 mmol/L — ABNORMAL LOW (ref 135–145)
Total Bilirubin: 1.2 mg/dL (ref 0.3–1.2)
Total Protein: 8.1 g/dL (ref 6.5–8.1)

## 2021-04-23 LAB — URINALYSIS, ROUTINE W REFLEX MICROSCOPIC
Bilirubin Urine: NEGATIVE
Glucose, UA: NEGATIVE mg/dL
Hgb urine dipstick: NEGATIVE
Ketones, ur: >80 mg/dL — AB
Nitrite: NEGATIVE
Protein, ur: 30 mg/dL — AB
Specific Gravity, Urine: 1.034 — ABNORMAL HIGH (ref 1.005–1.030)
pH: 5.5 (ref 5.0–8.0)

## 2021-04-23 LAB — CBC
HCT: 41.5 % (ref 36.0–46.0)
Hemoglobin: 13.9 g/dL (ref 12.0–15.0)
MCH: 29.6 pg (ref 26.0–34.0)
MCHC: 33.5 g/dL (ref 30.0–36.0)
MCV: 88.5 fL (ref 80.0–100.0)
Platelets: 306 10*3/uL (ref 150–400)
RBC: 4.69 MIL/uL (ref 3.87–5.11)
RDW: 12.7 % (ref 11.5–15.5)
WBC: 11.8 10*3/uL — ABNORMAL HIGH (ref 4.0–10.5)
nRBC: 0 % (ref 0.0–0.2)

## 2021-04-23 LAB — PREGNANCY, URINE: Preg Test, Ur: POSITIVE — AB

## 2021-04-23 LAB — RESP PANEL BY RT-PCR (FLU A&B, COVID) ARPGX2
Influenza A by PCR: NEGATIVE
Influenza B by PCR: NEGATIVE
SARS Coronavirus 2 by RT PCR: NEGATIVE

## 2021-04-23 LAB — LIPASE, BLOOD: Lipase: <10 U/L — ABNORMAL LOW (ref 11–51)

## 2021-04-23 LAB — HCG, QUANTITATIVE, PREGNANCY: hCG, Beta Chain, Quant, S: 42992 m[IU]/mL — ABNORMAL HIGH (ref <5)

## 2021-04-23 MED ORDER — DOXYLAMINE SUCCINATE (SLEEP) 25 MG PO TABS: 25.0000 mg | ORAL_TABLET | Freq: Every day | ORAL | 0 refills | Status: DC | PRN

## 2021-04-23 MED ORDER — ONDANSETRON 4 MG PO TBDP: 4.0000 mg | ORAL_TABLET | Freq: Three times a day (TID) | ORAL | 0 refills | Status: DC | PRN

## 2021-04-23 MED ORDER — SODIUM CHLORIDE 0.9 % IV BOLUS
1000.0000 mL | Freq: Once | INTRAVENOUS | Status: AC
  Administered 2021-04-23: 1000 mL via INTRAVENOUS

## 2021-04-23 MED ORDER — ONDANSETRON HCL 4 MG/2ML IJ SOLN
4.0000 mg | Freq: Once | INTRAMUSCULAR | Status: AC
  Administered 2021-04-23: 4 mg via INTRAVENOUS

## 2021-04-23 MED ORDER — DOXYLAMINE SUCCINATE (SLEEP) 25 MG PO TABS: 25.0000 mg | ORAL_TABLET | Freq: Every evening | ORAL | Status: DC | PRN

## 2021-04-23 NOTE — Discharge Instructions (Addendum)
Please establish care with OB/GYN.  You may take Zofran and Unisom as needed for nausea and vomiting.  Please drink plenty of fluids and get plenty of rest.  Please return to the emergency department for worsening symptoms.

## 2021-04-23 NOTE — ED Provider Notes (Signed)
[facility] EMERGENCY DEPT Provider Note   CSN: [id number] Arrival date & time: 04/23/21  1132     History Chief Complaint  Patient presents with   Abdominal Pain    Kylie [name] is a 31 y.o. female who presents the emergency department with a 4-day history of lower abdominal cramping, nausea, vomiting, and diarrhea.  Diarrhea has since resolved but patient still having a lot of vomiting.  Unable to keep anything down.  No cough, congestion, sore throat, fever, chills.  Patient does endorse similar symptoms with her previous pregnancies.  She denies any vaginal symptoms.  Patient is [id number].  Last menstrual period was approximately 1 month ago and she is irregular.  No urinary complaints.   Abdominal Pain     Home Medications Prior to Admission medications   Medication Sig Start Date End Date Taking? Authorizing Provider  doxylamine, Sleep, (UNISOM) 25 MG tablet Take 1 tablet (25 mg total) by mouth daily as needed. 04/23/21  Yes Raul Del, Tilton Marsalis M, PA-C  ondansetron (ZOFRAN-ODT) 4 MG disintegrating tablet Take 1 tablet (4 mg total) by mouth every 8 (eight) hours as needed for nausea or vomiting. 04/23/21  Yes Raul Del, Chirsty Armistead M, PA-C  IBUPROFEN PO Take 500 mg by mouth every 6 (six) hours as needed for fever.    [provider]      Allergies    Patient has no known allergies.    Review of Systems   Review of Systems  Gastrointestinal:  Positive for abdominal pain.  All other systems reviewed and are negative.  Physical Exam Updated Vital Signs BP (!) 110/53 (BP Location: Left Arm)    Pulse 61    Temp 98.7 F (37.1 C) (Oral)    Resp 18    Ht 5\' 4"  (1.626 m)    Wt 86.2 kg    LMP 03/13/2021    SpO2 100%    BMI 32.61 kg/m  Physical Exam Vitals and nursing note reviewed.  Constitutional:      General: She is not in acute distress.    Appearance: Normal appearance.  HENT:     Head: Normocephalic and atraumatic.  Eyes:     General:        Right eye: No  discharge.        Left eye: No discharge.  Cardiovascular:     Comments: Regular rate and rhythm.  S1/S2 are distinct without any evidence of murmur, rubs, or gallops.  Radial pulses are 2+ bilaterally.  Dorsalis pedis pulses are 2+ bilaterally.  No evidence of pedal edema. Pulmonary:     Comments: Clear to auscultation bilaterally.  Normal effort.  No respiratory distress.  No evidence of wheezes, rales, or rhonchi heard throughout. Abdominal:     General: Abdomen is flat. Bowel sounds are normal. There is no distension.     Tenderness: There is no abdominal tenderness. There is no guarding or rebound.  Musculoskeletal:        General: Normal range of motion.     Cervical back: Neck supple.  Skin:    General: Skin is warm and dry.     Findings: No rash.  Neurological:     General: No focal deficit present.     Mental Status: She is alert.  Psychiatric:        Mood and Affect: Mood normal.        Behavior: Behavior normal.    ED Results / Procedures / Treatments   Labs (all labs ordered are listed,  but only abnormal results are displayed) Labs Reviewed  LIPASE, BLOOD - Abnormal; Notable for the following components:      Result Value   Lipase <10 (*)    All other components within normal limits  COMPREHENSIVE METABOLIC PANEL - Abnormal; Notable for the following components:   Sodium 134 (*)    CO2 20 (*)    All other components within normal limits  CBC - Abnormal; Notable for the following components:   WBC 11.8 (*)    All other components within normal limits  URINALYSIS, ROUTINE W REFLEX MICROSCOPIC - Abnormal; Notable for the following components:   APPearance HAZY (*)    Specific Gravity, Urine 1.034 (*)    Ketones, ur >80 (*)    Protein, ur 30 (*)    Leukocytes,Ua TRACE (*)    Bacteria, UA RARE (*)    All other components within normal limits  PREGNANCY, URINE - Abnormal; Notable for the following components:   Preg Test, Ur POSITIVE (*)    All other components  within normal limits  HCG, QUANTITATIVE, PREGNANCY - Abnormal; Notable for the following components:   hCG, Beta Chain, Quant, S 42,992 (*)    All other components within normal limits  RESP PANEL BY RT-PCR (FLU A&B, COVID) ARPGX2    EKG None  Radiology US OB LESS THAN 14 WEEKS WITH OB TRANSVAGINAL  Result Date: 04/23/2021 CLINICAL DATA:  Pelvic pain and cramping for 2 weeks. Nausea and vomiting. EXAM: OBSTETRIC <14 WK [place] AND TRANSVAGINAL OB US TECHNIQUE: Both transabdominal and transvaginal ultrasound examinations were performed for complete evaluation of the gestation as well as the maternal uterus, adnexal regions, and pelvic cul-de-sac. Transvaginal technique was performed to assess early pregnancy. COMPARISON:  None. FINDINGS: Intrauterine gestational sac: Single Yolk sac:  Visualized. Embryo:  Visualized. Cardiac Activity: Visualized. Heart Rate: 102 bpm CRL:  3 mm   5 w   6 d                  [place] EDC: 12/18/2021 Subchorionic hemorrhage:  None visualized. Maternal uterus/adnexae: Both ovaries are normal in appearance. No mass or abnormal free fluid identified. IMPRESSION: Single living IUP with estimated gestational age of [redacted] weeks 6 days, and [place] EDC of 12/18/2021. No maternal uterine or adnexal abnormality identified. Electronically Signed   By: Marlaine Hind M.D.   On: 04/23/2021 15:32    Procedures Procedures    Medications Ordered in ED Medications  sodium chloride 0.9 % bolus 1,000 mL (1,000 mLs Intravenous New Bag/Given 04/23/21 1302)  ondansetron (ZOFRAN) injection 4 mg (4 mg Intravenous Given 04/23/21 1414)    ED Course/ Medical Decision Making/ A&P Clinical Course as of 04/23/21 1553  Sat Apr 23, 2021  1308 Patient began actively vomiting in the department.  I was notified by nursing staff.  Zofran and doxylamine ordered. [CF]    Clinical Course User Index [CF] Hendricks Limes, PA-C                           Medical Decision Making Amount and/or Complexity of Data  Reviewed Labs: ordered. Radiology: ordered.  Risk Prescription drug management.   Kylie [name] is a 31 y.o. female who presents the emergency department for evaluation of lower abdominal cramping and nausea, vomiting, and diarrhea.  Initial work-up was ordered in triage.  I personally interpreted these labs which include CBC which revealed leukocytosis.  CMP showed mild hyponatremia but was otherwise  normal.  Urinalysis did show evidence of dehydration but no signs of overt infection.  Lipase is negative.  Urine pregnancy was positive.  This could be the source of her symptoms considering that she has had similar episodes with her previous pregnancies.  I will plan to add on a respiratory panel, serum quant, and pelvic ultrasound to evaluate concern that she is having lower abdominal cramping.  I do not feel that Rh status is needed at this time she is not having any vaginal bleeding.  We will also give her a liter of fluid she does appear dry on my exam.  I will plan to reevaluate.  Overall, she is in no acute distress and resting comfortably in the emergency department.  Vital signs are stable.  On reevaluation after Zofran and fluids, patient is feeling much better and wishes to go home.  I updated her on all the labs and imaging.  I did review the ultrasound which did not show any signs of significant abnormalities.  I do agree with the radiologist interpretation.  Quantitative hCG puts her around 5 weeks.  Respiratory panel was negative.  I will plan to write her prescription for Unisom and Zofran to go home with.  Patient will establish care with an OB/GYN.  We discussed strict return precautions.  Patient expressed full understanding.  She is safe for the outpatient setting. She will be discharged.   Social Determinants of Health with Concerns   Tobacco Use: Medium Risk   Smoking Tobacco Use: Former   Smokeless Tobacco Use: Never   Passive Exposure: Not on Designer, industrial/product  Strain: Not on file  Food Insecurity: Not on file  Transportation Needs: Not on file  Physical Activity: Not on file  Stress: Not on file  Social Connections: Not on file  Intimate Partner Violence: Not on file  Depression (LOV5-6): Not on file  Alcohol Screen: Not on file  Housing: Not on file    Final Clinical Impression(s) / ED Diagnoses Final diagnoses:  Abdominal pain during pregnancy in first trimester    Rx / DC Orders ED Discharge Orders          Ordered    ondansetron (ZOFRAN-ODT) 4 MG disintegrating tablet  Every 8 hours PRN        04/23/21 1552    doxylamine, Sleep, (UNISOM) 25 MG tablet  Daily PRN       Note to Pharmacy: Can adjust dosing as needed.   04/23/21 [place], [place], PA-C 43/32/95 [id number]    Lianne Cure, DO 16/60/63 2112

## 2021-04-23 NOTE — ED Triage Notes (Signed)
Pt c/o N/V/D and lower abd cramping that started 4 days ago. Pt had associated chills and sweats.

## 2021-05-11 ENCOUNTER — Encounter (HOSPITAL_BASED_OUTPATIENT_CLINIC_OR_DEPARTMENT_OTHER): Payer: Self-pay

## 2021-05-11 ENCOUNTER — Emergency Department (HOSPITAL_BASED_OUTPATIENT_CLINIC_OR_DEPARTMENT_OTHER)
Admission: EM | Admit: 2021-05-11 | Discharge: 2021-05-11 | Disposition: A | Discharge status: Home / Self Care | Payer: Medicaid Other | Attending: Emergency Medicine | Admitting: Emergency Medicine

## 2021-05-11 ENCOUNTER — Other Ambulatory Visit: Payer: Self-pay

## 2021-05-11 DIAGNOSIS — R0789 Other chest pain: Secondary | ICD-10-CM | POA: Diagnosis not present

## 2021-05-11 DIAGNOSIS — R112 Nausea with vomiting, unspecified: Secondary | ICD-10-CM | POA: Diagnosis present

## 2021-05-11 DIAGNOSIS — R197 Diarrhea, unspecified: Secondary | ICD-10-CM | POA: Diagnosis not present

## 2021-05-11 DIAGNOSIS — Z5321 Procedure and treatment not carried out due to patient leaving prior to being seen by health care provider: Secondary | ICD-10-CM | POA: Diagnosis not present

## 2021-05-11 LAB — CBC
HCT: 38.7 % (ref 36.0–46.0)
Hemoglobin: 13.2 g/dL (ref 12.0–15.0)
MCH: 29.9 pg (ref 26.0–34.0)
MCHC: 34.1 g/dL (ref 30.0–36.0)
MCV: 87.8 fL (ref 80.0–100.0)
Platelets: 309 10*3/uL (ref 150–400)
RBC: 4.41 MIL/uL (ref 3.87–5.11)
RDW: 12.2 % (ref 11.5–15.5)
WBC: 14.6 10*3/uL — ABNORMAL HIGH (ref 4.0–10.5)
nRBC: 0 % (ref 0.0–0.2)

## 2021-05-11 LAB — COMPREHENSIVE METABOLIC PANEL
ALT: 20 U/L (ref 0–44)
AST: 16 U/L (ref 15–41)
Albumin: 4.4 g/dL (ref 3.5–5.0)
Alkaline Phosphatase: 55 U/L (ref 38–126)
Anion gap: 15 (ref 5–15)
BUN: 12 mg/dL (ref 6–20)
CO2: 21 mmol/L — ABNORMAL LOW (ref 22–32)
Calcium: 9.7 mg/dL (ref 8.9–10.3)
Chloride: 98 mmol/L (ref 98–111)
Creatinine, Ser: 0.69 mg/dL (ref 0.44–1.00)
GFR, Estimated: >60 mL/min (ref >60)
Glucose, Bld: 70 mg/dL (ref 70–99)
Potassium: 3.7 mmol/L (ref 3.5–5.1)
Sodium: 134 mmol/L — ABNORMAL LOW (ref 135–145)
Total Bilirubin: 1.1 mg/dL (ref 0.3–1.2)
Total Protein: 7.8 g/dL (ref 6.5–8.1)

## 2021-05-11 LAB — LIPASE, BLOOD: Lipase: <10 U/L — ABNORMAL LOW (ref 11–51)

## 2021-05-11 NOTE — ED Triage Notes (Signed)
Patient here POV from Home with Emesis.  Patient states she was seen for Nausea and Emesis approximately 2 weeks PTA. Returns for worsening symptoms despite Medications.  Also endorses Pain to Bilateral Torso from Vomiting.   No Fevers. No Diarrhea.   NAD noted during Triage. A&Ox4. GCS 15. Ambulatory.

## 2021-05-11 NOTE — ED Notes (Signed)
Per Registration, Patient Left Department. Patient to be discharged accordingly.

## 2021-05-27 DIAGNOSIS — Z3490 Encounter for supervision of normal pregnancy, unspecified, unspecified trimester: Secondary | ICD-10-CM | POA: Insufficient documentation

## 2021-05-27 DIAGNOSIS — O4401 Placenta previa specified as without hemorrhage, first trimester: Secondary | ICD-10-CM | POA: Insufficient documentation

## 2021-05-27 DIAGNOSIS — O09299 Supervision of pregnancy with other poor reproductive or obstetric history, unspecified trimester: Secondary | ICD-10-CM | POA: Insufficient documentation

## 2021-05-27 HISTORY — DX: Encounter for supervision of normal pregnancy, unspecified, unspecified trimester: Z34.90

## 2021-05-30 DIAGNOSIS — O99320 Drug use complicating pregnancy, unspecified trimester: Secondary | ICD-10-CM | POA: Insufficient documentation

## 2021-07-05 DIAGNOSIS — O4402 Placenta previa specified as without hemorrhage, second trimester: Secondary | ICD-10-CM | POA: Insufficient documentation

## 2021-07-05 DIAGNOSIS — O99210 Obesity complicating pregnancy, unspecified trimester: Secondary | ICD-10-CM | POA: Insufficient documentation

## 2021-07-05 DIAGNOSIS — Z7289 Other problems related to lifestyle: Secondary | ICD-10-CM | POA: Insufficient documentation

## 2021-07-06 ENCOUNTER — Other Ambulatory Visit: Payer: Self-pay | Admitting: Certified Nurse Midwife

## 2021-07-06 DIAGNOSIS — Z3689 Encounter for other specified antenatal screening: Secondary | ICD-10-CM

## 2021-07-21 ENCOUNTER — Other Ambulatory Visit: Payer: Self-pay

## 2021-07-21 DIAGNOSIS — O4402 Placenta previa specified as without hemorrhage, second trimester: Secondary | ICD-10-CM

## 2021-07-27 ENCOUNTER — Encounter: Payer: Self-pay | Admitting: *Deleted

## 2021-07-27 ENCOUNTER — Ambulatory Visit: Payer: Medicaid Other

## 2021-08-02 ENCOUNTER — Ambulatory Visit: Payer: Medicaid Other | Attending: Orthopaedic Surgery

## 2021-08-02 ENCOUNTER — Ambulatory Visit: Payer: Medicaid Other | Admitting: *Deleted

## 2021-08-02 VITALS — BP 125/60 | HR 81

## 2021-08-02 DIAGNOSIS — F1290 Cannabis use, unspecified, uncomplicated: Secondary | ICD-10-CM

## 2021-08-02 DIAGNOSIS — F17290 Nicotine dependence, other tobacco product, uncomplicated: Secondary | ICD-10-CM | POA: Diagnosis not present

## 2021-08-02 DIAGNOSIS — O4402 Placenta previa specified as without hemorrhage, second trimester: Secondary | ICD-10-CM | POA: Diagnosis present

## 2021-08-02 DIAGNOSIS — O99322 Drug use complicating pregnancy, second trimester: Secondary | ICD-10-CM | POA: Diagnosis not present

## 2021-08-02 DIAGNOSIS — O09292 Supervision of pregnancy with other poor reproductive or obstetric history, second trimester: Secondary | ICD-10-CM

## 2021-08-02 DIAGNOSIS — O99212 Obesity complicating pregnancy, second trimester: Secondary | ICD-10-CM | POA: Diagnosis not present

## 2021-08-02 DIAGNOSIS — Z3A20 20 weeks gestation of pregnancy: Secondary | ICD-10-CM

## 2021-08-03 ENCOUNTER — Other Ambulatory Visit: Payer: Self-pay | Admitting: *Deleted

## 2021-08-03 DIAGNOSIS — O99320 Drug use complicating pregnancy, unspecified trimester: Secondary | ICD-10-CM

## 2021-08-03 DIAGNOSIS — F1290 Cannabis use, unspecified, uncomplicated: Secondary | ICD-10-CM

## 2021-08-03 DIAGNOSIS — O99212 Obesity complicating pregnancy, second trimester: Secondary | ICD-10-CM

## 2021-08-03 DIAGNOSIS — O09299 Supervision of pregnancy with other poor reproductive or obstetric history, unspecified trimester: Secondary | ICD-10-CM

## 2021-08-04 ENCOUNTER — Ambulatory Visit: Payer: Medicaid Other

## 2021-09-01 ENCOUNTER — Ambulatory Visit: Payer: Medicaid Other | Admitting: *Deleted

## 2021-09-01 ENCOUNTER — Encounter: Payer: Self-pay | Admitting: *Deleted

## 2021-09-01 ENCOUNTER — Ambulatory Visit: Payer: Medicaid Other | Attending: Maternal & Fetal Medicine

## 2021-09-01 ENCOUNTER — Other Ambulatory Visit: Payer: Self-pay | Admitting: *Deleted

## 2021-09-01 VITALS — BP 122/60 | HR 80

## 2021-09-01 DIAGNOSIS — O09299 Supervision of pregnancy with other poor reproductive or obstetric history, unspecified trimester: Secondary | ICD-10-CM | POA: Insufficient documentation

## 2021-09-01 DIAGNOSIS — O99320 Drug use complicating pregnancy, unspecified trimester: Secondary | ICD-10-CM

## 2021-09-01 DIAGNOSIS — F1290 Cannabis use, unspecified, uncomplicated: Secondary | ICD-10-CM

## 2021-09-01 DIAGNOSIS — Z8632 Personal history of gestational diabetes: Secondary | ICD-10-CM | POA: Diagnosis present

## 2021-09-01 DIAGNOSIS — Z3689 Encounter for other specified antenatal screening: Secondary | ICD-10-CM | POA: Insufficient documentation

## 2021-09-01 DIAGNOSIS — Z3A25 25 weeks gestation of pregnancy: Secondary | ICD-10-CM

## 2021-09-01 DIAGNOSIS — Z6832 Body mass index (BMI) 32.0-32.9, adult: Secondary | ICD-10-CM

## 2021-09-01 DIAGNOSIS — E669 Obesity, unspecified: Secondary | ICD-10-CM | POA: Diagnosis not present

## 2021-09-01 DIAGNOSIS — O99212 Obesity complicating pregnancy, second trimester: Secondary | ICD-10-CM | POA: Diagnosis not present

## 2021-09-29 DIAGNOSIS — R7309 Other abnormal glucose: Secondary | ICD-10-CM | POA: Insufficient documentation

## 2021-10-27 ENCOUNTER — Ambulatory Visit: Payer: Medicaid Other | Attending: Obstetrics and Gynecology

## 2021-10-27 ENCOUNTER — Ambulatory Visit: Payer: Medicaid Other

## 2021-11-11 ENCOUNTER — Other Ambulatory Visit: Payer: Self-pay

## 2021-11-11 ENCOUNTER — Observation Stay: Payer: Medicaid Other

## 2021-11-11 ENCOUNTER — Encounter: Payer: Self-pay | Admitting: Obstetrics and Gynecology

## 2021-11-11 ENCOUNTER — Observation Stay
Admission: EM | Admit: 2021-11-11 | Discharge: 2021-11-12 | Disposition: A | Discharge status: Home / Self Care | Payer: Medicaid Other | Attending: Obstetrics and Gynecology | Admitting: Obstetrics and Gynecology

## 2021-11-11 DIAGNOSIS — O0993 Supervision of high risk pregnancy, unspecified, third trimester: Principal | ICD-10-CM | POA: Insufficient documentation

## 2021-11-11 DIAGNOSIS — O4703 False labor before 37 completed weeks of gestation, third trimester: Secondary | ICD-10-CM | POA: Insufficient documentation

## 2021-11-11 DIAGNOSIS — Z3686 Encounter for antenatal screening for cervical length: Secondary | ICD-10-CM

## 2021-11-11 DIAGNOSIS — Z3A35 35 weeks gestation of pregnancy: Secondary | ICD-10-CM | POA: Diagnosis not present

## 2021-11-11 DIAGNOSIS — O3443 Maternal care for other abnormalities of cervix, third trimester: Secondary | ICD-10-CM | POA: Insufficient documentation

## 2021-11-11 DIAGNOSIS — O09523 Supervision of elderly multigravida, third trimester: Secondary | ICD-10-CM | POA: Diagnosis not present

## 2021-11-11 DIAGNOSIS — N888 Other specified noninflammatory disorders of cervix uteri: Secondary | ICD-10-CM | POA: Insufficient documentation

## 2021-11-11 DIAGNOSIS — Z87891 Personal history of nicotine dependence: Secondary | ICD-10-CM | POA: Insufficient documentation

## 2021-11-11 DIAGNOSIS — O4193X0 Disorder of amniotic fluid and membranes, unspecified, third trimester, not applicable or unspecified: Secondary | ICD-10-CM | POA: Diagnosis present

## 2021-11-11 DIAGNOSIS — N898 Other specified noninflammatory disorders of vagina: Secondary | ICD-10-CM | POA: Diagnosis present

## 2021-11-11 LAB — URINALYSIS, ROUTINE W REFLEX MICROSCOPIC
Bilirubin Urine: NEGATIVE
Glucose, UA: NEGATIVE mg/dL
Ketones, ur: NEGATIVE mg/dL
Nitrite: NEGATIVE
Protein, ur: NEGATIVE mg/dL
Specific Gravity, Urine: 1.011 (ref 1.005–1.030)
pH: 7 (ref 5.0–8.0)

## 2021-11-11 LAB — CBC
HCT: 37.5 % (ref 36.0–46.0)
Hemoglobin: 12.4 g/dL (ref 12.0–15.0)
MCH: 28.7 pg (ref 26.0–34.0)
MCHC: 33.1 g/dL (ref 30.0–36.0)
MCV: 86.8 fL (ref 80.0–100.0)
Platelets: 300 10*3/uL (ref 150–400)
RBC: 4.32 MIL/uL (ref 3.87–5.11)
RDW: 13.2 % (ref 11.5–15.5)
WBC: 22.2 10*3/uL — ABNORMAL HIGH (ref 4.0–10.5)
nRBC: 0 % (ref 0.0–0.2)

## 2021-11-11 LAB — WET PREP, GENITAL
Clue Cells Wet Prep HPF POC: NONE SEEN
Sperm: NONE SEEN
Trich, Wet Prep: NONE SEEN
WBC, Wet Prep HPF POC: >=10 — AB (ref <10)
Yeast Wet Prep HPF POC: NONE SEEN

## 2021-11-11 LAB — RAPID HIV SCREEN (HIV 1/2 AB+AG)
HIV 1/2 Antibodies: NONREACTIVE
HIV-1 P24 Antigen - HIV24: NONREACTIVE

## 2021-11-11 LAB — TYPE AND SCREEN
ABO/RH(D): O POS
Antibody Screen: NEGATIVE

## 2021-11-11 LAB — GROUP B STREP BY PCR: Group B strep by PCR: NEGATIVE

## 2021-11-11 LAB — RUPTURE OF MEMBRANE (ROM)PLUS: Rom Plus: NEGATIVE

## 2021-11-11 MED ORDER — FAMOTIDINE 20 MG PO TABS
20.0000 mg | ORAL_TABLET | Freq: Every day | ORAL | Status: DC
  Administered 2021-11-11: 20 mg via ORAL
  Filled 2021-11-11: qty 1

## 2021-11-11 MED ORDER — ACETAMINOPHEN 325 MG PO TABS
650.0000 mg | ORAL_TABLET | ORAL | Status: DC | PRN
  Administered 2021-11-12: 650 mg via ORAL
  Filled 2021-11-11: qty 2

## 2021-11-11 MED ORDER — CALCIUM CARBONATE ANTACID 500 MG PO CHEW
2.0000 | CHEWABLE_TABLET | ORAL | Status: DC | PRN
  Administered 2021-11-11: 400 mg via ORAL
  Filled 2021-11-11: qty 2

## 2021-11-11 MED ORDER — SILVER NITRATE-POT NITRATE 75-25 % EX MISC
3.0000 | Freq: Once | CUTANEOUS | Status: DC
Start: 2021-11-11 — End: 2021-11-12
  Filled 2021-11-11: qty 3

## 2021-11-11 MED ORDER — TRANEXAMIC ACID 1000 MG/10ML IV SOLN
INTRAVENOUS | Status: AC
  Filled 2021-11-11: qty 10

## 2021-11-11 MED ORDER — PRENATAL MULTIVITAMIN CH: 1.0000 | ORAL_TABLET | Freq: Every day | ORAL | Status: DC

## 2021-11-11 MED ORDER — ONDANSETRON 4 MG PO TBDP
4.0000 mg | ORAL_TABLET | Freq: Three times a day (TID) | ORAL | Status: DC | PRN
  Administered 2021-11-11: 4 mg via ORAL
  Filled 2021-11-11: qty 1

## 2021-11-11 NOTE — OB Triage Note (Signed)
Kylie [name] is a 31 y.o. female. She is at 67w1dgestation. Patient's last menstrual period was 03/10/2021. Estimated Date of Delivery: 12/15/21  [facility]  Current pregnancy complicated by:  Elevated 1hr GTT Placenta previa in early second trimester, no previa noted on anatomy [place] Obesity BMI: 31.62 Nicotine use in pregnancy  Vaping  THC use in pregnancy  Chief complaint: Leaking fluid from vagina  She reports leaking clear fluid from her vagina since Sunday. She states every time she moves she feels fluid come out. Good fetal movement, no contractions, one incidence of light vaginal spotting.  S: Resting comfortably. no CTX, no VB.  Active fetal movement.  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 Authorizing Provider  prenatal vitamin w/FE, FA (NATACHEW) 29-1 MG CHEW chewable tablet Chew 1 tablet by mouth daily at 12 noon.   Yes [provider]  promethazine (PHENERGAN) 12.5 MG tablet Take 12.5 mg by mouth every 6 (six) hours as needed for nausea or vomiting.   Yes [provider]  doxylamine, Sleep, (UNISOM) 25 MG tablet Take 1 tablet (25 mg total) by mouth daily as needed. 16/38/45  GCampbell StallP, DO  ondansetron (ZOFRAN-ODT) 4 MG disintegrating tablet Take 1 tablet (4 mg total) by mouth every 8 (eight) hours as needed for  nausea or vomiting. 13/64/68  GLianne Cure DO      Social History: She  reports that she quit smoking about 2 years ago. Her smoking use included cigarettes. She has a 0.25 pack-year smoking history. She has never used smokeless tobacco. She reports that she does not currently use alcohol. She reports current drug use. Drug: Marijuana.  Family History: family history includes Cancer in her paternal aunt and paternal grandmother; Diabetes in her paternal grandfather; Hyperlipidemia in her father; Hypertension in her father; Kidney disease in her paternal grandmother.  no history of gyn cancers  Review of Systems: A full review of systems was performed and negative except as noted in the HPI.     O:  BP 117/75 (BP Location: Left Arm)   Pulse 83   Temp 98.3 F (36.8 C) (Oral)   Resp 18   Ht '5\' 4"'$  (1.626 m)   Wt 91.6 kg   LMP 03/10/2021   BMI 34.67 kg/m  Results for orders placed or performed during the hospital encounter of 11/11/21 (from the past 48 hour(s))  Wet prep, genital   Collection Time: 11/11/21 12:37 PM  Result Value Ref Range   Yeast Wet Prep HPF POC NONE SEEN NONE SEEN   Trich, Wet Prep NONE SEEN NONE SEEN   Clue Cells Wet Prep HPF POC NONE SEEN NONE SEEN   WBC, Wet Prep HPF POC >=10 (A) <10   Sperm NONE SEEN   ROM Plus ([facility] only)  Collection Time: 11/11/21 12:37 PM  Result Value Ref Range   Rom Plus NEGATIVE   Urinalysis, Routine w reflex microscopic   Collection Time: 11/11/21 12:37 PM  Result Value Ref Range   Color, Urine YELLOW (A) YELLOW   APPearance HAZY (A) CLEAR   Specific Gravity, Urine 1.011 1.005 - 1.030   pH 7.0 5.0 - 8.0   Glucose, UA NEGATIVE NEGATIVE mg/dL   Hgb urine dipstick SMALL (A) NEGATIVE   Bilirubin Urine NEGATIVE NEGATIVE   Ketones, ur NEGATIVE NEGATIVE mg/dL   Protein, ur NEGATIVE NEGATIVE mg/dL   Nitrite NEGATIVE NEGATIVE   Leukocytes,Ua MODERATE (A) NEGATIVE   RBC / HPF 0-5 0 - 5 RBC/hpf   WBC, UA 6-10 0 - 5 WBC/hpf    Bacteria, UA RARE (A) NONE SEEN   Squamous Epithelial / LPF 0-5 0 - 5   Mucus PRESENT      Constitutional: NAD, AAOx3  HE/ENT: extraocular movements grossly intact, moist mucous membranes CV: RRR PULM: nl respiratory effort, CTABL     Abd: gravid, non-tender, non-distended, soft      Ext: Non-tender, Nonedematous   Psych: mood appropriate, speech normal Pelvic: 2/thick/ballottable, SSE done  Pelvic exam: CERVIX: friable large purplish mass distorting the cervix,   Grayish clear fluid poured into speculum when mass was touched with speculum  Fetal  monitoring: Cat 1 Appropriate for GA Baseline: 145bpm Variability: moderate Accelerations: present x >2 Decelerations absent Contractions: occasional  A/P: 31 y.o. 58w1dhere for antenatal surveillance for leaking fluid  Principle Diagnosis:  High risk pregnancy in third trimester  Labor: not present.  Fetal Wellbeing: Reassuring Cat 1 tracing. Leaking fluid: ROM plus negative, wet prep negative. Cervical mass: discussed assessment of cervical mass with Dr. BLeafy Roand she will come evaluate.  Reactive NST    DGertie Fey CF[facility] Center8/18/2023 7:32 PM

## 2021-11-11 NOTE — OB Triage Note (Signed)
Pt presents for vaginal discharge that increased last weekend. Pt reports discharge as milky. Pt noticed scant pink tinged blood on toilet paper last night. +FM. Reports braxton hicks for 2 weeks but reports not painful

## 2021-11-11 NOTE — OB Triage Provider Note (Signed)
TRIAGE VISIT with NST - very much outside the global  CC: Leaking fluid in pregnancy   Kylie [name] is a 31 y.o. [id number]. She is at 41w1dgestation.  S: Resting comfortably. no CTX, no VB. Active fetal movement. Concerned about leaking since Sunday, dark red blood and cloudy fluid.   O:  BP 116/77 (BP Location: Left Arm)   Pulse 94   Temp 98.1 F (36.7 C) (Oral)   Resp 16   Ht '5\' 4"'$  (1.626 m)   Wt 91.6 kg   LMP 03/10/2021   BMI 34.67 kg/m  Results for orders placed or performed during the hospital encounter of 11/11/21 (from the past 48 hour(s))  Wet prep, genital   Collection Time: 11/11/21 12:37 PM  Result Value Ref Range   Yeast Wet Prep HPF POC NONE SEEN NONE SEEN   Trich, Wet Prep NONE SEEN NONE SEEN   Clue Cells Wet Prep HPF POC NONE SEEN NONE SEEN   WBC, Wet Prep HPF POC >=10 (A) <10   Sperm NONE SEEN   ROM Plus ([facility] only)   Collection Time: 11/11/21 12:37 PM  Result Value Ref Range   Rom Plus NEGATIVE   Urinalysis, Routine w reflex microscopic   Collection Time: 11/11/21 12:37 PM  Result Value Ref Range   Color, Urine YELLOW (A) YELLOW   APPearance HAZY (A) CLEAR   Specific Gravity, Urine 1.011 1.005 - 1.030   pH 7.0 5.0 - 8.0   Glucose, UA NEGATIVE NEGATIVE mg/dL   Hgb urine dipstick SMALL (A) NEGATIVE   Bilirubin Urine NEGATIVE NEGATIVE   Ketones, ur NEGATIVE NEGATIVE mg/dL   Protein, ur NEGATIVE NEGATIVE mg/dL   Nitrite NEGATIVE NEGATIVE   Leukocytes,Ua MODERATE (A) NEGATIVE   RBC / HPF 0-5 0 - 5 RBC/hpf   WBC, UA 6-10 0 - 5 WBC/hpf   Bacteria, UA RARE (A) NONE SEEN   Squamous Epithelial / LPF 0-5 0 - 5   Mucus PRESENT      Gen: NAD, AAOx3      Abd: FNTTP      Ext: Non-tender, Nonedmeatous  Cervix: normal anterior rim with large 9cm heterogenous mass in the middle of the cervix, holding the edges open anteriorly but posterior cervix not visible. I am unable to feel the distal end of this mass. Visually, it appears to be dark necrotic tissue. Pt  can feel when touched. No fetal heart rate changes when palpated.    FHT: 140, mod var, +accels, no decels TOCO: q4-8 minutes SVE: Exam by:: Wilson CNM   A/P:  31y.o. [id number] 340w1dith vaginal discharge, R/o ROM.  Concerning mass holding the cervix open. Last cervical cancer screening 7 years ago. No cervical exams during this pregnancy yet. Tissue extracted and sent to pathology in formalin. If pathology does not yield results, plan for MRI to characterize prior to delivery. Plan for monitoring and d/c home. Labor: not present.  R/o ROM: SSE negative x 3. Wet prep negative. Normal AFI with 6cm cervical length. Perfectly normal amniotic fluid. FWB: NST is Reassuring Cat 1 tracing. F/U in the office next week. Precautions given.

## 2021-11-11 NOTE — Progress Notes (Signed)
Labor Progress Note  Kylie [name] is a 31 y.o. [phone] at 55w1dby LMP in observation for a cervical mass and now uterine contractions  Subjective: she is feeling the contractions, states they are strong and she is breathing through some of them  Objective: BP 117/75 (BP Location: Left Arm)   Pulse 83   Temp 98.3 F (36.8 C) (Oral)   Resp 18   Ht '5\' 4"'$  (1.626 m)   Wt 91.6 kg   LMP 03/10/2021   BMI 34.67 kg/m  Notable VS details: reviewed  Fetal Assessment: FHT:  FHR: 135 bpm, variability: moderate,  accelerations:  Present,  decelerations:  Absent Category/reactivity:  Category I UC:   regular, every 3-4 minutes SVE:    Dilation: 4cm  Effacement: 50%  Station:  Floating  Consistency: medium  Position: posterior  Membrane status:intact Amniotic color: n/a  Labs: Lab Results  Component Value Date   WBC 14.6 (H) 05/11/2021   HGB 13.2 05/11/2021   HCT 38.7 05/11/2021   MCV 87.8 05/11/2021   PLT 309 05/11/2021    Assessment / Plan: 31year old [id number] at 347w1dere with a cervical mass, and now uterine contractions  - Contractions increased after manipulation of cervical mass. Discussed with her this may be a reaction from cervical manipulation, or she may be going into preterm labor.  - Cervical change from 2/thick/ballottable to 4/50/ballottable - Discussed with Dr. BeLeafy Rond NNP on call if BMZ is needed. She is 359w1do based on latest research, will not give BMZ at this time.  - Discussed with Dr. BeaLeafy Rod patient that if she begins to actively labor before we know more information about her cervical mass, a primary cesarean section would be recommended d/t potential bleeding risks. Pt is agreeable with a primary cesarean section if she begins actively laboring before biopsy results are back. - Will monitor overnight. If begins to actively labor, will proceed with delivery. If contractions space out and no more cervical change, will consider discharge in the  morning. - IV lock in place - Labs: CBC, RPR, HIV, ABO/Rh, GBS, and GC/CL - Dr. BeaLeafy Rodated and agreeable with POC.  DanGertie FeyNM 11/11/2021, 9:23 PM

## 2021-11-12 DIAGNOSIS — O0993 Supervision of high risk pregnancy, unspecified, third trimester: Secondary | ICD-10-CM | POA: Diagnosis not present

## 2021-11-12 LAB — CHLAMYDIA/NGC RT PCR (ARMC ONLY)
Chlamydia Tr: NOT DETECTED
N gonorrhoeae: NOT DETECTED

## 2021-11-12 LAB — RPR: RPR Ser Ql: NONREACTIVE

## 2021-11-12 LAB — ABO/RH: ABO/RH(D): O POS

## 2021-11-12 NOTE — H&P (Signed)
Kylie [name] is a 31 y.o. female. She is at 68w1dgestation. Patient's last menstrual period was 03/10/2021. Estimated Date of Delivery: 12/15/21  [facility]  Current pregnancy complicated by:  Elevated 1hr GTT Placenta previa in early second trimester, no previa noted on anatomy [place] Obesity BMI: 31.62 Nicotine use in pregnancy  Vaping  THC use in pregnancy  Chief complaint: Leaking fluid from vagina  She reports leaking clear fluid from her vagina since Sunday. She states every time she moves she feels fluid come out. Good fetal movement, no contractions, one incidence of light vaginal spotting.  S: Resting comfortably. no CTX, no VB.  Active fetal movement.  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 Authorizing Provider  prenatal vitamin w/FE, FA (NATACHEW) 29-1 MG CHEW chewable tablet Chew 1 tablet by mouth daily at 12 noon.   Yes [provider]  promethazine (PHENERGAN) 12.5 MG tablet Take 12.5 mg by mouth every 6 (six) hours as needed for nausea or vomiting.   Yes [provider]  doxylamine, Sleep, (UNISOM) 25 MG tablet Take 1 tablet (25 mg total) by mouth daily as needed. 11/54/00  GCampbell StallP, DO  ondansetron (ZOFRAN-ODT) 4 MG disintegrating tablet Take 1 tablet (4 mg total) by mouth every 8 (eight) hours as needed for  nausea or vomiting. 18/67/61  GLianne Cure DO      Social History: She  reports that she quit smoking about 2 years ago. Her smoking use included cigarettes. She has a 0.25 pack-year smoking history. She has never used smokeless tobacco. She reports that she does not currently use alcohol. She reports current drug use. Drug: Marijuana.  Family History: family history includes Cancer in her paternal aunt and paternal grandmother; Diabetes in her paternal grandfather; Hyperlipidemia in her father; Hypertension in her father; Kidney disease in her paternal grandmother.  no history of gyn cancers  Review of Systems: A full review of systems was performed and negative except as noted in the HPI.     O:  BP 117/75 (BP Location: Left Arm)   Pulse 83   Temp 98.3 F (36.8 C) (Oral)   Resp 18   Ht '5\' 4"'$  (1.626 m)   Wt 91.6 kg   LMP 03/10/2021   BMI 34.67 kg/m  Results for orders placed or performed during the hospital encounter of 11/11/21 (from the past 48 hour(s))  Wet prep, genital   Collection Time: 11/11/21 12:37 PM  Result Value Ref Range   Yeast Wet Prep HPF POC NONE SEEN NONE SEEN   Trich, Wet Prep NONE SEEN NONE SEEN   Clue Cells Wet Prep HPF POC NONE SEEN NONE SEEN   WBC, Wet Prep HPF POC >=10 (A) <10   Sperm NONE SEEN   ROM Plus ([facility] only)  Collection Time: 11/11/21 12:37 PM  Result Value Ref Range   Rom Plus NEGATIVE   Urinalysis, Routine w reflex microscopic   Collection Time: 11/11/21 12:37 PM  Result Value Ref Range   Color, Urine YELLOW (A) YELLOW   APPearance HAZY (A) CLEAR   Specific Gravity, Urine 1.011 1.005 - 1.030   pH 7.0 5.0 - 8.0   Glucose, UA NEGATIVE NEGATIVE mg/dL   Hgb urine dipstick SMALL (A) NEGATIVE   Bilirubin Urine NEGATIVE NEGATIVE   Ketones, ur NEGATIVE NEGATIVE mg/dL   Protein, ur NEGATIVE NEGATIVE mg/dL   Nitrite NEGATIVE NEGATIVE   Leukocytes,Ua MODERATE (A) NEGATIVE   RBC / HPF 0-5 0 - 5 RBC/hpf   WBC, UA 6-10 0 - 5 WBC/hpf    Bacteria, UA RARE (A) NONE SEEN   Squamous Epithelial / LPF 0-5 0 - 5   Mucus PRESENT      Constitutional: NAD, AAOx3  HE/ENT: extraocular movements grossly intact, moist mucous membranes CV: RRR PULM: nl respiratory effort, CTABL     Abd: gravid, non-tender, non-distended, soft      Ext: Non-tender, Nonedematous   Psych: mood appropriate, speech normal Pelvic: 2/thick/ballottable, SSE done  Pelvic exam: CERVIX: friable large purplish mass distorting the cervix,   Grayish clear fluid poured into speculum when mass was touched with speculum  Fetal  monitoring: Cat 1 Appropriate for GA Baseline: 145bpm Variability: moderate Accelerations: present x >2 Decelerations absent Contractions: occasional  A/P: 31 y.o. 98w1dhere for antenatal surveillance for leaking fluid  Principle Diagnosis:  High risk pregnancy in third trimester  Labor: not present.  Fetal Wellbeing: Reassuring Cat 1 tracing. Leaking fluid: ROM plus negative, wet prep negative. Cervical mass: discussed assessment of cervical mass with Dr. BLeafy Roand she will come evaluate.  Reactive NST    DGertie Fey [facility]8/18/2023 7:32 PM

## 2021-11-12 NOTE — Progress Notes (Signed)
Patient ID: Kylie [name] MRN: [id number] DOB/AGE: 1990/06/27 31 y.o.  Admit date: 11/11/2021 Discharge date: 11/12/2021  Admission Diagnoses: [redacted] weeks pregnant, vaginal bleeding, vaginal discharge  Discharge Diagnoses: cervical mass, preterm uterine contractions, [redacted] weeks pregnant  Prenatal Care Site: [facility]  Prenatal Procedures: samples of cervical mass sent to pathology by Dr. Leafy Ro  Consults: None  Significant Diagnostic Studies:  Results for orders placed or performed during the hospital encounter of 11/11/21 (from the past 168 hour(s))  Wet prep, genital   Collection Time: 11/11/21 12:37 PM  Result Value Ref Range   Yeast Wet Prep HPF POC NONE SEEN NONE SEEN   Trich, Wet Prep NONE SEEN NONE SEEN   Clue Cells Wet Prep HPF POC NONE SEEN NONE SEEN   WBC, Wet Prep HPF POC >=10 (A) <10   Sperm NONE SEEN   ROM Plus ([facility] only)   Collection Time: 11/11/21 12:37 PM  Result Value Ref Range   Rom Plus NEGATIVE   Urinalysis, Routine w reflex microscopic   Collection Time: 11/11/21 12:37 PM  Result Value Ref Range   Color, Urine YELLOW (A) YELLOW   APPearance HAZY (A) CLEAR   Specific Gravity, Urine 1.011 1.005 - 1.030   pH 7.0 5.0 - 8.0   Glucose, UA NEGATIVE NEGATIVE mg/dL   Hgb urine dipstick SMALL (A) NEGATIVE   Bilirubin Urine NEGATIVE NEGATIVE   Ketones, ur NEGATIVE NEGATIVE mg/dL   Protein, ur NEGATIVE NEGATIVE mg/dL   Nitrite NEGATIVE NEGATIVE   Leukocytes,Ua MODERATE (A) NEGATIVE   RBC / HPF 0-5 0 - 5 RBC/hpf   WBC, UA 6-10 0 - 5 WBC/hpf   Bacteria, UA RARE (A) NONE SEEN   Squamous Epithelial / LPF 0-5 0 - 5   Mucus PRESENT   Group B strep by PCR   Collection Time: 11/11/21  9:30 PM   Specimen: Vaginal/Rectal; Genital  Result Value Ref Range   Group B strep by PCR NEGATIVE NEGATIVE  CBC   Collection Time: 11/11/21  9:52 PM  Result Value Ref Range   WBC 22.2 (H) 4.0 - 10.5 K/uL   RBC 4.32 3.87 - 5.11 MIL/uL   Hemoglobin 12.4 12.0 - 15.0  g/dL   HCT 37.5 36.0 - 46.0 %   MCV 86.8 80.0 - 100.0 fL   MCH 28.7 26.0 - 34.0 pg   MCHC 33.1 30.0 - 36.0 g/dL   RDW 13.2 11.5 - 15.5 %   Platelets 300 150 - 400 K/uL   nRBC 0.0 0.0 - 0.2 %  Rapid HIV screen (HIV 1/2 Ab+Ag)   Collection Time: 11/11/21  9:52 PM  Result Value Ref Range   HIV-1 P24 Antigen - HIV24 NON REACTIVE NON REACTIVE   HIV 1/2 Antibodies NON REACTIVE NON REACTIVE   Interpretation (HIV Ag Ab)      A non reactive test result means that HIV 1 or HIV 2 antibodies and HIV 1 p24 antigen were not detected in the specimen.  Type and screen [place]   Collection Time: 11/11/21  9:52 PM  Result Value Ref Range   ABO/RH(D) O POS    Antibody Screen NEG    Sample Expiration      11/14/2021,2359 Performed at [facility], [address] [place],  [address]   ABO/Rh   Collection Time: 11/11/21 10:54 PM  Result Value Ref Range   ABO/RH(D)      O POS Performed at [facility], [place],  [place] [address]   [place] rt PCR ([place] only)   Collection Time: 11/11/21 11:18 PM   Specimen: Urine; GU  Result Value Ref Range   Specimen source GC/Chlam URINE, RANDOM    Chlamydia Tr NOT DETECTED NOT DETECTED   N gonorrhoeae NOT DETECTED NOT DETECTED    Treatments: consult with Dr. Leafy Ro to evaluate anterior cervical mass, sample sent to pathology  Hospital Course:  This is a 31 y.o. [id number] with IUP at 35w2dadmitted for a cervical mass on her anterior cervix, with cervical dilation and change noted after assessment, noted to have initial cervical exam of 2/thick/ballottable.  Vaginal bleeding noted after palpating cervical mass, clear gray discharge coming from cervical mass. After assessment of her cervical mass by both D. WRedmond Pulling CNM and Dr. BLeafy Ro she began contracting and made cervical change to 4/50/ballottable. Observed over 9 hours and contractions spaced out and she made no more cervical change.  She was deemed stable for discharge home with close outpatient follow-up.  Discharge Physical Exam:   BP 121/71 (BP Location: Left Arm)   Pulse 87   Temp 97.7 F (36.5 C) (Oral)   Resp 16   Ht '5\' 4"'$  (1.626 m)   Wt 91.6 kg   LMP 03/10/2021   BMI 34.67 kg/m   General: NAD CV: RRR Pulm: CTABL, nl effort ABD: s/nd/nt, gravid DVT Evaluation: LE non-ttp, no evidence of DVT on exam.  SVE: Dilation: 4 Effacement (%): 50 Station: Ballotable Exam by:: D Todd Argabright CNM Cervical mass unchanged Fetal monitoring: Baseline: 110 bpm, Variability: Good {> 6 bpm), Accelerations: Reactive, and Decelerations: Absent  TOCO: irregular, every 10-15 minutes; mild to palpation    Discharge Condition: Stable  Disposition: Discharge disposition: 01-Home or Self Care        Allergies as of 11/12/2021   No Known Allergies      Medication List     TAKE these medications    doxylamine (Sleep) 25 MG tablet Commonly known as: UNISOM Take 1 tablet (25 mg total) by mouth daily as needed.   ondansetron 4 MG disintegrating tablet Commonly known as: ZOFRAN-ODT Take 1 tablet (4 mg total) by mouth every 8 (eight) hours as needed for nausea or vomiting.   prenatal vitamin w/FE, FA 29-1 MG Chew chewable tablet Chew 1 tablet by mouth daily at 12 noon.   promethazine 12.5 MG tablet Commonly known as: PHENERGAN Take 12.5 mg by mouth every 6 (six) hours as needed for nausea or vomiting.        Follow-up Information     BBenjaman Kindler MD. Call in 5 day(s).   Specialty: Obstetrics and Gynecology Why: Schedule follow up on cervical mass with Dr. BSallyanne Kusterinformation: 1[place] [phone]                Signed:  DGertie Fey8/19/2023 6:04 AM ----- DLucrezia Europe CNM Certified Nurse Midwife [facility]

## 2021-11-15 LAB — SURGICAL PATHOLOGY

## 2021-11-16 ENCOUNTER — Encounter: Payer: Self-pay | Admitting: Obstetrics and Gynecology

## 2021-11-16 ENCOUNTER — Inpatient Hospital Stay: Payer: Medicaid Other | Attending: Hematology and Oncology | Admitting: Obstetrics and Gynecology

## 2021-11-16 VITALS — BP 121/81 | HR 104 | Temp 98.7°F | Resp 20 | Wt 208.4 lb

## 2021-11-16 DIAGNOSIS — Z3A36 36 weeks gestation of pregnancy: Secondary | ICD-10-CM | POA: Insufficient documentation

## 2021-11-16 DIAGNOSIS — C539 Malignant neoplasm of cervix uteri, unspecified: Secondary | ICD-10-CM | POA: Insufficient documentation

## 2021-11-16 DIAGNOSIS — F419 Anxiety disorder, unspecified: Secondary | ICD-10-CM | POA: Diagnosis not present

## 2021-11-16 DIAGNOSIS — Z87891 Personal history of nicotine dependence: Secondary | ICD-10-CM | POA: Insufficient documentation

## 2021-11-16 DIAGNOSIS — Z87440 Personal history of urinary (tract) infections: Secondary | ICD-10-CM | POA: Insufficient documentation

## 2021-11-16 DIAGNOSIS — F32A Depression, unspecified: Secondary | ICD-10-CM | POA: Diagnosis not present

## 2021-11-16 NOTE — Progress Notes (Signed)
Gynecologic Oncology Consult Visit   Referring Provider: Dr. Leafy [name]  Chief Complaint: Cervical Cancer, [redacted] weeks pregnant  Subjective:  Kylie [name] is a G49P4 31 y.o. female who is seen in consultation from Dr. Leafy [name] for cervical cancer. Patient is currently 61w6dpregnant. Pregnancy was complicated by possible placenta previa though not apparent on anatomy [place] In view of concern about previa, pelvic exams were not done.  Patient presented to ER on 11/11/21 reporting fluid leakage. On exam, cervical os was firm with `mass about 8 cm continuous with the cervix from 8-12:00, friable, heterogeneous. Biopsy was obtained. ROM plus testing was negative.  She does not desire future fertility.  CERVIX; BIOPSY: INVASIVE SQUAMOUS CELL CARCINOMA.  Comment:  The biopsy fragments demonstrate moderately differentiated invasive squamous cell carcinoma.  Due to tangential sectioning the depth of invasion cannot be determined.   HIV was negative on 05/27/21. She did not receive HPV vaccines.   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 Last pap 2014 Last pap:  per hpi History of STDs: The patient reports a past history of: chlamydia and trichomonas. Sexually active: yes  OB History:  OB History  Gravida Para Term Preterm AB Living  '9 4 4 '$ 0 4 4  SAB IAB Ectopic Multiple Live Births  3 1 0 0 4    # Outcome Date GA Lbr Len/2nd Weight Sex Delivery Anes PTL Lv  9 Current           8 Term 07/31/14 456w1d5:27 / 00:25 9 lb 0.5 oz (4.095 kg) M Vag-Spont EPI  LIV  7 SAB 10/23/12 6w66w0d      6 SAB 10/15/11 6w061w0d     5 Term 01/03/11 42w070w0db 7 oz (4.281 kg) M Vag-Spont EPI  LIV     Birth Comments: No complications  4 Term 11/1835/90/24d14w0d 10 oz (3.005 kg) F Vag-Spont EPI  LIV     Birth Comments: No complications  3 Term 12/29/07/73/53 65w0d11 oz (3.487 kg) M Vag-Spont EPI  LIV     Birth Comments: No complications  2 IAB           1 SAB             Family History: Family History  Problem Relation Age of Onset   Hyperlipidemia Father    Hypertension Father    Cancer Paternal Grandmother    Kidney disease Paternal  Grandmother    Diabetes Paternal Grandfather    Cancer Paternal Aunt    Hearing loss Neg Hx     Social History: Social History   Socioeconomic History   Marital status: Significant Other    Spouse name: Not on file   Number of children: Not on file   Years of education: Not on file   Highest education level: Not on file  Occupational History   Not on file  Tobacco Use   Smoking status: Former    Packs/day: 0.25    Years: 1.00    Total pack years: 0.25    Types: Cigarettes    Quit date: 03/28/2019    Years since quitting: 2.6   Smokeless tobacco: Never   Tobacco comments:    electronic- e-sigs-mostly  Vaping Use   Vaping Use: Former   Substances: Nicotine, THC, Flavoring  Substance and Sexual Activity   Alcohol use: Not Currently    Comment: rarely   Drug use:  Yes    Types: Marijuana    Comment: Delta 8 THC, Jul 25 2021   Sexual activity: Yes    Birth control/protection: None  Other Topics Concern   Not on file  Social History Narrative   Not on file   Social Determinants of Health   Financial Resource Strain: Not on file  Food Insecurity: Not on file  Transportation Needs: Not on file  Physical Activity: Not on file  Stress: Not on file  Social Connections: Not on file  Intimate Partner Violence: Not on file    Allergies: No Known Allergies  Current Medications: Current Outpatient Medications  Medication Sig Dispense Refill   doxylamine, Sleep, (UNISOM) 25 MG tablet Take 1 tablet (25 mg total) by mouth daily as needed. 30 tablet 0   ondansetron (ZOFRAN-ODT) 4 MG disintegrating tablet Take 1 tablet (4 mg total) by mouth every 8 (eight) hours as needed for nausea or vomiting. 30 tablet 0   prenatal vitamin w/FE, FA (NATACHEW) 29-1 MG CHEW chewable tablet Chew 1 tablet by mouth daily at 12 noon.     promethazine (PHENERGAN) 12.5 MG tablet Take 12.5 mg by mouth every 6 (six) hours as needed for nausea or vomiting.     No current facility-administered medications for this visit.   General: negative for fevers, changes in weight or night sweats Skin: negative for changes in moles or sores or rash Eyes: negative for changes in vision HEENT: negative for change in hearing, tinnitus, voice changes Pulmonary: negative for dyspnea, orthopnea, productive cough, wheezing Cardiac: negative for palpitations, pain Gastrointestinal: negative for nausea, vomiting, constipation, diarrhea, hematemesis, hematochezia Genitourinary/Sexual: negative for dysuria, retention, hematuria, incontinence Ob/Gyn:  per hpi Musculoskeletal: negative for pain, joint pain, back pain Hematology: negative for easy bruising, abnormal bleeding Neurologic/Psych: negative for headaches, seizures, paralysis, weakness, numbness   Objective:  Physical Examination:  BP  121/81   Pulse (!) 104   Temp 98.7 F (37.1 C)   Resp 20   Wt 208 lb 6.4 oz (94.5 kg)   LMP 03/10/2021   SpO2 97%   BMI 35.77 kg/m     ECOG Performance Status: 1 - Symptomatic but completely ambulatory  GENERAL: Patient is a well appearing female in no acute distress. Accompanied by significant other.  HEENT:  PERRL, neck supple with midline trachea.  NODES:  No cervical, supraclavicular, axillary, or inguinal lymphadenopathy palpated.  LUNGS:  Clear to auscultation bilaterally.  No wheezes or rhonchi. HEART:  Regular rate and rhythm. No murmur  appreciated. ABDOMEN:  gravid uterus MSK:  ambulatory EXTREMITIES:  No peripheral edema.   SKIN:  Clear with no obvious rashes or skin changes.  NEURO:  Nonfocal. Well oriented.  Appropriate affect.  Pelvic: exam chaperoned by NP Kylie [name]: no lesions Cervix: 6 cm exophytic tumor from 8-12 o'clock.  Not bleeding, but yellow discharge.  Vagina: no lesions, no discharge or bleeding Uterus: gravid Adnexa: no palpable masses Rectovaginal: deferred  Lab Review Lab Results  Component Value Date   WBC 22.2 (H) 11/11/2021   HGB 12.4 11/11/2021   HCT 37.5 11/11/2021   MCV 86.8 11/11/2021   PLT 300 11/11/2021     Chemistry      Component Value Date/Time   NA 134 (L) 05/11/2021 2052   K 3.7 05/11/2021 2052   CL 98 05/11/2021 2052   CO2 21 (L) 05/11/2021 2052   BUN 12 05/11/2021 2052   CREATININE 0.69 05/11/2021 2052      Component Value Date/Time   CALCIUM 9.7 05/11/2021 2052   ALKPHOS 55 05/11/2021 2052   AST 16 05/11/2021 2052   ALT 20 05/11/2021 2052   BILITOT 1.1 05/11/2021 2052        Radiologic Imaging: MRI ordered    Assessment:  Kylie [name] is a 31 y.o. 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 No obvious extension to vagina or parametria.  WBC count elevated to 22k, but has been elevated throughout pregnancy to 14k.  No evidence of infection, but has copious yellow discharge from the cancer.     Plan:   Problem List Items Addressed This Visit   None Visit Diagnoses     Malignant neoplasm of cervix, unspecified site ([place])    -  Primary   Relevant Medications   amoxicillin (AMOXIL) 500 MG tablet   Other Relevant Orders   MR Pelvis Wo Contrast      We discussed options for management of stage I cervical cancer including radical hysterectomy and node sampling versus chemoradiation. Explained to the patient and husband that large stage IB3 cancers are usually treated with chemoradiation in view of high chance or needing adjuvant therapy if primary radical hysterectomy is performed.  Fortunately, she is [redacted] weeks gestation and can be delivered now.  Dr Kylie [name] was called and will arrange for the patient to have a C section in the next few days.  She is not bleeding now, but would not want her to go into labor, as cervical laceration could cause profuse bleeding.  Also discussed oophoropexy and salpingectomy at the time of C section to move the ovaries out of the radiation field in attempt to avoid premature menopause.  She can then have a PET/CT scan and see Dr Kylie [name] in Radiation Oncology soon after delivery in anticipation of chemoradiation with external beam therapy with weekly cisplatin followed by brachytherapy.   The patient's diagnosis, an outline of the further diagnostic and laboratory studies which will be required, the recommendation for surgery, and alternatives were discussed with her and her accompanying family members.  All questions were answered to their satisfaction.  Kylie Au, NP  I personally interviewed and examined the patient. Agreed with the above/below plan of care. I have directly contributed to assessment and plan of care of this patient and educated and discussed with patient and family.  Kylie Drown, MD  CC:  Kylie [name], [address] [place],  [place]  [phone]  

## 2021-11-17 ENCOUNTER — Inpatient Hospital Stay: Payer: Medicaid Other | Admitting: Certified Registered Nurse Anesthetist

## 2021-11-17 ENCOUNTER — Encounter
Admission: RE | Disposition: A | Discharge status: Home / Self Care | Payer: Self-pay | Source: Home / Self Care | Attending: Obstetrics and Gynecology

## 2021-11-17 ENCOUNTER — Other Ambulatory Visit: Payer: Self-pay

## 2021-11-17 ENCOUNTER — Encounter: Payer: Self-pay | Admitting: Obstetrics and Gynecology

## 2021-11-17 ENCOUNTER — Inpatient Hospital Stay
Admission: RE | Admit: 2021-11-17 | Discharge: 2021-11-19 | DRG: 785 | Disposition: A | Discharge status: Home / Self Care | Payer: Medicaid Other | Attending: Obstetrics and Gynecology | Admitting: Obstetrics and Gynecology

## 2021-11-17 DIAGNOSIS — Z87891 Personal history of nicotine dependence: Secondary | ICD-10-CM | POA: Diagnosis not present

## 2021-11-17 DIAGNOSIS — Z302 Encounter for sterilization: Secondary | ICD-10-CM | POA: Diagnosis not present

## 2021-11-17 DIAGNOSIS — O9A12 Malignant neoplasm complicating childbirth: Secondary | ICD-10-CM | POA: Diagnosis present

## 2021-11-17 DIAGNOSIS — O99824 Streptococcus B carrier state complicating childbirth: Secondary | ICD-10-CM | POA: Diagnosis present

## 2021-11-17 DIAGNOSIS — O0993 Supervision of high risk pregnancy, unspecified, third trimester: Secondary | ICD-10-CM

## 2021-11-17 DIAGNOSIS — O99214 Obesity complicating childbirth: Secondary | ICD-10-CM | POA: Diagnosis present

## 2021-11-17 DIAGNOSIS — F1290 Cannabis use, unspecified, uncomplicated: Secondary | ICD-10-CM | POA: Diagnosis present

## 2021-11-17 DIAGNOSIS — O26893 Other specified pregnancy related conditions, third trimester: Secondary | ICD-10-CM | POA: Diagnosis present

## 2021-11-17 DIAGNOSIS — C539 Malignant neoplasm of cervix uteri, unspecified: Secondary | ICD-10-CM | POA: Diagnosis present

## 2021-11-17 DIAGNOSIS — O99324 Drug use complicating childbirth: Secondary | ICD-10-CM | POA: Diagnosis present

## 2021-11-17 DIAGNOSIS — Z3A36 36 weeks gestation of pregnancy: Secondary | ICD-10-CM

## 2021-11-17 LAB — URINE DRUG SCREEN, QUALITATIVE (ARMC ONLY)
Amphetamines, Ur Screen: NOT DETECTED
Barbiturates, Ur Screen: NOT DETECTED
Benzodiazepine, Ur Scrn: NOT DETECTED
Cannabinoid 50 Ng, Ur ~~LOC~~: POSITIVE — AB
Cocaine Metabolite,Ur ~~LOC~~: NOT DETECTED
MDMA (Ecstasy)Ur Screen: NOT DETECTED
Methadone Scn, Ur: NOT DETECTED
Opiate, Ur Screen: NOT DETECTED
Phencyclidine (PCP) Ur S: NOT DETECTED
Tricyclic, Ur Screen: NOT DETECTED

## 2021-11-17 LAB — CBC
HCT: 34.1 % — ABNORMAL LOW (ref 36.0–46.0)
Hemoglobin: 11.2 g/dL — ABNORMAL LOW (ref 12.0–15.0)
MCH: 29 pg (ref 26.0–34.0)
MCHC: 32.8 g/dL (ref 30.0–36.0)
MCV: 88.3 fL (ref 80.0–100.0)
Platelets: 234 10*3/uL (ref 150–400)
RBC: 3.86 MIL/uL — ABNORMAL LOW (ref 3.87–5.11)
RDW: 13.4 % (ref 11.5–15.5)
WBC: 20.8 10*3/uL — ABNORMAL HIGH (ref 4.0–10.5)
nRBC: 0 % (ref 0.0–0.2)

## 2021-11-17 LAB — COMPREHENSIVE METABOLIC PANEL
ALT: 22 U/L (ref 0–44)
AST: 21 U/L (ref 15–41)
Albumin: 2.7 g/dL — ABNORMAL LOW (ref 3.5–5.0)
Alkaline Phosphatase: 135 U/L — ABNORMAL HIGH (ref 38–126)
Anion gap: 6 (ref 5–15)
BUN: <5 mg/dL — ABNORMAL LOW (ref 6–20)
CO2: 21 mmol/L — ABNORMAL LOW (ref 22–32)
Calcium: 8.9 mg/dL (ref 8.9–10.3)
Chloride: 108 mmol/L (ref 98–111)
Creatinine, Ser: 0.65 mg/dL (ref 0.44–1.00)
GFR, Estimated: >60 mL/min (ref >60)
Glucose, Bld: 89 mg/dL (ref 70–99)
Potassium: 3.8 mmol/L (ref 3.5–5.1)
Sodium: 135 mmol/L (ref 135–145)
Total Bilirubin: 0.7 mg/dL (ref 0.3–1.2)
Total Protein: 7.2 g/dL (ref 6.5–8.1)

## 2021-11-17 LAB — CBC WITH DIFFERENTIAL/PLATELET
Abs Immature Granulocytes: 0.12 10*3/uL — ABNORMAL HIGH (ref 0.00–0.07)
Basophils Absolute: 0.1 10*3/uL (ref 0.0–0.1)
Basophils Relative: 1 %
Eosinophils Absolute: 1.1 10*3/uL — ABNORMAL HIGH (ref 0.0–0.5)
Eosinophils Relative: 6 %
HCT: 36.8 % (ref 36.0–46.0)
Hemoglobin: 12.1 g/dL (ref 12.0–15.0)
Immature Granulocytes: 1 %
Lymphocytes Relative: 15 %
Lymphs Abs: 2.8 10*3/uL (ref 0.7–4.0)
MCH: 28.9 pg (ref 26.0–34.0)
MCHC: 32.9 g/dL (ref 30.0–36.0)
MCV: 88 fL (ref 80.0–100.0)
Monocytes Absolute: 0.5 10*3/uL (ref 0.1–1.0)
Monocytes Relative: 3 %
Neutro Abs: 13.7 10*3/uL — ABNORMAL HIGH (ref 1.7–7.7)
Neutrophils Relative %: 74 %
Platelets: 272 10*3/uL (ref 150–400)
RBC: 4.18 MIL/uL (ref 3.87–5.11)
RDW: 13.2 % (ref 11.5–15.5)
WBC: 18.3 10*3/uL — ABNORMAL HIGH (ref 4.0–10.5)
nRBC: 0 % (ref 0.0–0.2)

## 2021-11-17 LAB — TYPE AND SCREEN
ABO/RH(D): O POS
Antibody Screen: NEGATIVE

## 2021-11-17 LAB — CREATININE, SERUM
Creatinine, Ser: 0.54 mg/dL (ref 0.44–1.00)
GFR, Estimated: >60 mL/min (ref >60)

## 2021-11-17 SURGERY — Surgical Case
Anesthesia: Spinal | Laterality: Bilateral

## 2021-11-17 MED ORDER — ACETAMINOPHEN 500 MG PO TABS
1000.0000 mg | ORAL_TABLET | Freq: Four times a day (QID) | ORAL | Status: DC
  Administered 2021-11-17 – 2021-11-18 (×2): 1000 mg via ORAL
  Filled 2021-11-17 (×2): qty 2

## 2021-11-17 MED ORDER — WITCH HAZEL-GLYCERIN EX PADS: 1.0000 | MEDICATED_PAD | CUTANEOUS | Status: DC | PRN

## 2021-11-17 MED ORDER — ONDANSETRON HCL 4 MG/2ML IJ SOLN: 4.0000 mg | Freq: Three times a day (TID) | INTRAMUSCULAR | Status: DC | PRN

## 2021-11-17 MED ORDER — SCOPOLAMINE 1 MG/3DAYS TD PT72: 1.0000 | MEDICATED_PATCH | Freq: Once | TRANSDERMAL | Status: DC

## 2021-11-17 MED ORDER — DIPHENHYDRAMINE HCL 50 MG/ML IJ SOLN: 12.5000 mg | INTRAMUSCULAR | Status: DC | PRN

## 2021-11-17 MED ORDER — FENTANYL CITRATE (PF) 100 MCG/2ML IJ SOLN
INTRAMUSCULAR | Status: DC | PRN
  Administered 2021-11-17 (×2): 50 ug via INTRAVENOUS

## 2021-11-17 MED ORDER — KETOROLAC TROMETHAMINE 30 MG/ML IJ SOLN
30.0000 mg | Freq: Four times a day (QID) | INTRAMUSCULAR | Status: AC
  Administered 2021-11-17 – 2021-11-18 (×4): 30 mg via INTRAVENOUS
  Filled 2021-11-17 (×4): qty 1

## 2021-11-17 MED ORDER — HYDROMORPHONE HCL 1 MG/ML IJ SOLN
INTRAMUSCULAR | Status: AC
  Filled 2021-11-17: qty 1

## 2021-11-17 MED ORDER — FENTANYL CITRATE (PF) 100 MCG/2ML IJ SOLN
25.0000 ug | INTRAMUSCULAR | Status: DC | PRN
  Administered 2021-11-17: 50 ug via INTRAVENOUS

## 2021-11-17 MED ORDER — OXYTOCIN-SODIUM CHLORIDE 30-0.9 UT/500ML-% IV SOLN
INTRAVENOUS | Status: DC | PRN
  Administered 2021-11-17: 500 mL via INTRAVENOUS

## 2021-11-17 MED ORDER — DROPERIDOL 2.5 MG/ML IJ SOLN: 0.6250 mg | Freq: Once | INTRAMUSCULAR | Status: DC | PRN

## 2021-11-17 MED ORDER — MEPERIDINE HCL 25 MG/ML IJ SOLN: 6.2500 mg | INTRAMUSCULAR | Status: DC | PRN

## 2021-11-17 MED ORDER — OXYCODONE HCL 5 MG PO TABS: 5.0000 mg | ORAL_TABLET | ORAL | Status: DC | PRN

## 2021-11-17 MED ORDER — HYDROMORPHONE HCL 1 MG/ML IJ SOLN
INTRAMUSCULAR | Status: DC | PRN
  Administered 2021-11-17 (×2): .5 mg via INTRAVENOUS

## 2021-11-17 MED ORDER — SIMETHICONE 80 MG PO CHEW: 80.0000 mg | CHEWABLE_TABLET | ORAL | Status: DC | PRN

## 2021-11-17 MED ORDER — PHENYLEPHRINE HCL-NACL 20-0.9 MG/250ML-% IV SOLN
INTRAVENOUS | Status: DC | PRN
  Administered 2021-11-17: 15 ug/min via INTRAVENOUS

## 2021-11-17 MED ORDER — DEXAMETHASONE SODIUM PHOSPHATE 10 MG/ML IJ SOLN
INTRAMUSCULAR | Status: DC | PRN
  Administered 2021-11-17: 10 mg via INTRAVENOUS

## 2021-11-17 MED ORDER — ONDANSETRON HCL 4 MG/2ML IJ SOLN
INTRAMUSCULAR | Status: AC
  Filled 2021-11-17: qty 2

## 2021-11-17 MED ORDER — GABAPENTIN 300 MG PO CAPS
300.0000 mg | ORAL_CAPSULE | Freq: Every day | ORAL | Status: DC
  Administered 2021-11-17 – 2021-11-18 (×2): 300 mg via ORAL
  Filled 2021-11-17 (×2): qty 1

## 2021-11-17 MED ORDER — MENTHOL 3 MG MT LOZG: 1.0000 | LOZENGE | OROMUCOSAL | Status: DC | PRN

## 2021-11-17 MED ORDER — BUPIVACAINE HCL (PF) 0.5 % IJ SOLN
INTRAMUSCULAR | Status: AC
  Filled 2021-11-17: qty 60

## 2021-11-17 MED ORDER — ONDANSETRON HCL 4 MG/2ML IJ SOLN
INTRAMUSCULAR | Status: DC | PRN
  Administered 2021-11-17: 4 mg via INTRAVENOUS

## 2021-11-17 MED ORDER — SODIUM CHLORIDE (PF) 0.9 % IJ SOLN
INTRAMUSCULAR | Status: DC | PRN
  Administered 2021-11-17: 20 mL

## 2021-11-17 MED ORDER — OXYTOCIN-SODIUM CHLORIDE 30-0.9 UT/500ML-% IV SOLN
INTRAVENOUS | Status: AC
  Filled 2021-11-17: qty 1000

## 2021-11-17 MED ORDER — NALOXONE HCL 4 MG/10ML IJ SOLN: 1.0000 ug/kg/h | INTRAVENOUS | Status: DC | PRN

## 2021-11-17 MED ORDER — BUPIVACAINE IN DEXTROSE 0.75-8.25 % IT SOLN
INTRATHECAL | Status: DC | PRN
  Administered 2021-11-17: 1.6 mL via INTRATHECAL

## 2021-11-17 MED ORDER — OXYCODONE HCL 5 MG/5ML PO SOLN: 5.0000 mg | Freq: Once | ORAL | Status: DC | PRN

## 2021-11-17 MED ORDER — MIDAZOLAM HCL 2 MG/2ML IJ SOLN
INTRAMUSCULAR | Status: DC | PRN
  Administered 2021-11-17: 2 mg via INTRAVENOUS

## 2021-11-17 MED ORDER — SUGAMMADEX SODIUM 200 MG/2ML IV SOLN
INTRAVENOUS | Status: DC | PRN
  Administered 2021-11-17: 200 mg via INTRAVENOUS

## 2021-11-17 MED ORDER — SENNOSIDES-DOCUSATE SODIUM 8.6-50 MG PO TABS
2.0000 | ORAL_TABLET | ORAL | Status: DC
  Administered 2021-11-18 – 2021-11-19 (×2): 2 via ORAL
  Filled 2021-11-17 (×2): qty 2

## 2021-11-17 MED ORDER — SUCCINYLCHOLINE CHLORIDE 200 MG/10ML IV SOSY
PREFILLED_SYRINGE | INTRAVENOUS | Status: DC | PRN
  Administered 2021-11-17: 80 mg via INTRAVENOUS

## 2021-11-17 MED ORDER — LIDOCAINE HCL (PF) 1 % IJ SOLN
INTRAMUSCULAR | Status: AC
  Filled 2021-11-17: qty 2

## 2021-11-17 MED ORDER — SOD CITRATE-CITRIC ACID 500-334 MG/5ML PO SOLN: 30.0000 mL | ORAL | Status: DC

## 2021-11-17 MED ORDER — ACETAMINOPHEN 10 MG/ML IV SOLN
INTRAVENOUS | Status: AC
  Filled 2021-11-17: qty 100

## 2021-11-17 MED ORDER — NALOXONE HCL 0.4 MG/ML IJ SOLN: 0.4000 mg | INTRAMUSCULAR | Status: DC | PRN

## 2021-11-17 MED ORDER — SIMETHICONE 80 MG PO CHEW
80.0000 mg | CHEWABLE_TABLET | Freq: Three times a day (TID) | ORAL | Status: DC
  Administered 2021-11-18 – 2021-11-19 (×4): 80 mg via ORAL
  Filled 2021-11-17 (×5): qty 1

## 2021-11-17 MED ORDER — PROPOFOL 10 MG/ML IV BOLUS
INTRAVENOUS | Status: DC | PRN
  Administered 2021-11-17: 120 mg via INTRAVENOUS

## 2021-11-17 MED ORDER — BISACODYL 10 MG RE SUPP: 10.0000 mg | Freq: Every day | RECTAL | Status: DC | PRN

## 2021-11-17 MED ORDER — SODIUM CHLORIDE 0.9% FLUSH: 3.0000 mL | INTRAVENOUS | Status: DC | PRN

## 2021-11-17 MED ORDER — MEASLES, MUMPS & RUBELLA VAC IJ SOLR
0.5000 mL | Freq: Once | INTRAMUSCULAR | Status: DC
  Filled 2021-11-17: qty 0.5

## 2021-11-17 MED ORDER — LACTATED RINGERS IV SOLN: INTRAVENOUS | Status: DC

## 2021-11-17 MED ORDER — ACETAMINOPHEN 10 MG/ML IV SOLN
INTRAVENOUS | Status: DC | PRN
  Administered 2021-11-17: 1000 mg via INTRAVENOUS

## 2021-11-17 MED ORDER — PROPOFOL 10 MG/ML IV BOLUS
INTRAVENOUS | Status: AC
  Filled 2021-11-17: qty 20

## 2021-11-17 MED ORDER — PHENYLEPHRINE HCL-NACL 20-0.9 MG/250ML-% IV SOLN
INTRAVENOUS | Status: AC
  Filled 2021-11-17: qty 250

## 2021-11-17 MED ORDER — KETOROLAC TROMETHAMINE 30 MG/ML IJ SOLN
30.0000 mg | Freq: Four times a day (QID) | INTRAMUSCULAR | Status: AC | PRN
  Administered 2021-11-17: 30 mg via INTRAVENOUS

## 2021-11-17 MED ORDER — MIDAZOLAM HCL 2 MG/2ML IJ SOLN
INTRAMUSCULAR | Status: AC
  Filled 2021-11-17: qty 2

## 2021-11-17 MED ORDER — OXYCODONE HCL 5 MG PO TABS: 5.0000 mg | ORAL_TABLET | Freq: Once | ORAL | Status: DC | PRN

## 2021-11-17 MED ORDER — TETANUS-DIPHTH-ACELL PERTUSSIS 5-2.5-18.5 LF-MCG/0.5 IM SUSY: 0.5000 mL | PREFILLED_SYRINGE | Freq: Once | INTRAMUSCULAR | Status: DC

## 2021-11-17 MED ORDER — CEFAZOLIN SODIUM-DEXTROSE 2-4 GM/100ML-% IV SOLN
2.0000 g | INTRAVENOUS | Status: AC
  Administered 2021-11-17: 2 g via INTRAVENOUS
  Filled 2021-11-17: qty 100

## 2021-11-17 MED ORDER — KETOROLAC TROMETHAMINE 30 MG/ML IJ SOLN: 30.0000 mg | Freq: Four times a day (QID) | INTRAMUSCULAR | Status: AC | PRN

## 2021-11-17 MED ORDER — ENOXAPARIN SODIUM 40 MG/0.4ML IJ SOSY
40.0000 mg | PREFILLED_SYRINGE | INTRAMUSCULAR | Status: DC
  Administered 2021-11-18 – 2021-11-19 (×2): 40 mg via SUBCUTANEOUS
  Filled 2021-11-17 (×2): qty 0.4

## 2021-11-17 MED ORDER — DIBUCAINE (PERIANAL) 1 % EX OINT: 1.0000 | TOPICAL_OINTMENT | CUTANEOUS | Status: DC | PRN

## 2021-11-17 MED ORDER — COCONUT OIL OIL: 1.0000 | TOPICAL_OIL | Status: DC | PRN

## 2021-11-17 MED ORDER — FENTANYL CITRATE (PF) 100 MCG/2ML IJ SOLN
INTRAMUSCULAR | Status: AC
  Filled 2021-11-17: qty 2

## 2021-11-17 MED ORDER — FLEET ENEMA 7-19 GM/118ML RE ENEM: 1.0000 | ENEMA | Freq: Every day | RECTAL | Status: DC | PRN

## 2021-11-17 MED ORDER — POVIDONE-IODINE 10 % EX SWAB
2.0000 | Freq: Once | CUTANEOUS | Status: AC
  Administered 2021-11-17: 2 via TOPICAL

## 2021-11-17 MED ORDER — SENNOSIDES-DOCUSATE SODIUM 8.6-50 MG PO TABS: 2.0000 | ORAL_TABLET | ORAL | Status: DC

## 2021-11-17 MED ORDER — DIPHENHYDRAMINE HCL 25 MG PO CAPS: 25.0000 mg | ORAL_CAPSULE | ORAL | Status: DC | PRN

## 2021-11-17 MED ORDER — ACETAMINOPHEN 10 MG/ML IV SOLN: 1000.0000 mg | Freq: Once | INTRAVENOUS | Status: DC | PRN

## 2021-11-17 MED ORDER — SOD CITRATE-CITRIC ACID 500-334 MG/5ML PO SOLN
ORAL | Status: AC
  Administered 2021-11-17: 30 mL
  Filled 2021-11-17: qty 15

## 2021-11-17 MED ORDER — PROMETHAZINE HCL 25 MG/ML IJ SOLN: 6.2500 mg | INTRAMUSCULAR | Status: DC | PRN

## 2021-11-17 MED ORDER — KETOROLAC TROMETHAMINE 30 MG/ML IJ SOLN
INTRAMUSCULAR | Status: AC
  Filled 2021-11-17: qty 1

## 2021-11-17 MED ORDER — FERROUS SULFATE 325 (65 FE) MG PO TABS
325.0000 mg | ORAL_TABLET | Freq: Two times a day (BID) | ORAL | Status: DC
  Administered 2021-11-18 – 2021-11-19 (×3): 325 mg via ORAL
  Filled 2021-11-17 (×3): qty 1

## 2021-11-17 MED ORDER — SODIUM CHLORIDE (PF) 0.9 % IJ SOLN
INTRAMUSCULAR | Status: AC
  Filled 2021-11-17: qty 50

## 2021-11-17 MED ORDER — ROCURONIUM 10MG/ML (10ML) SYRINGE FOR MEDFUSION PUMP - OPTIME
INTRAVENOUS | Status: DC | PRN
  Administered 2021-11-17: 40 mg via INTRAVENOUS

## 2021-11-17 MED ORDER — OXYTOCIN-SODIUM CHLORIDE 30-0.9 UT/500ML-% IV SOLN: 2.5000 [IU]/h | INTRAVENOUS | Status: AC

## 2021-11-17 MED ORDER — IBUPROFEN 600 MG PO TABS
600.0000 mg | ORAL_TABLET | Freq: Four times a day (QID) | ORAL | Status: DC
  Administered 2021-11-19 (×3): 600 mg via ORAL
  Filled 2021-11-17 (×3): qty 1

## 2021-11-17 MED ORDER — PRENATAL MULTIVITAMIN CH
1.0000 | ORAL_TABLET | Freq: Every day | ORAL | Status: DC
  Administered 2021-11-18 – 2021-11-19 (×2): 1 via ORAL
  Filled 2021-11-17 (×2): qty 1

## 2021-11-17 SURGICAL SUPPLY — 33 items
CHLORAPREP W/TINT 26 (MISCELLANEOUS) ×1 IMPLANT
CLIP TI LARGE 6 (CLIP) IMPLANT
DRSG TELFA 3X8 NADH (GAUZE/BANDAGES/DRESSINGS) ×1 IMPLANT
ELECT REM PT RETURN 9FT ADLT (ELECTROSURGICAL) ×1
ELECTRODE REM PT RTRN 9FT ADLT (ELECTROSURGICAL) ×1 IMPLANT
GAUZE SPONGE 4X4 12PLY STRL (GAUZE/BANDAGES/DRESSINGS) ×1 IMPLANT
GOWN STRL REUS W/ TWL LRG LVL3 (GOWN DISPOSABLE) ×3 IMPLANT
GOWN STRL REUS W/TWL LRG LVL3 (GOWN DISPOSABLE) ×3
LIGASURE IMPACT 36 18CM CVD LR (INSTRUMENTS) IMPLANT
MANIFOLD NEPTUNE II (INSTRUMENTS) ×1 IMPLANT
MAT PREVALON FULL STRYKER (MISCELLANEOUS) ×1 IMPLANT
NDL HYPO 25GX1X1/2 BEV (NEEDLE) ×1 IMPLANT
NEEDLE HYPO 25GX1X1/2 BEV (NEEDLE) ×1 IMPLANT
NS IRRIG 1000ML POUR BTL (IV SOLUTION) ×1 IMPLANT
PACK C SECTION AR (MISCELLANEOUS) ×1 IMPLANT
PAD DRESSING TELFA 3X8 NADH (GAUZE/BANDAGES/DRESSINGS) ×1 IMPLANT
PAD OB MATERNITY 4.3X12.25 (PERSONAL CARE ITEMS) ×1 IMPLANT
PAD PREP 24X41 OB/GYN DISP (PERSONAL CARE ITEMS) ×1 IMPLANT
SCRUB CHG 4% DYNA-HEX 4OZ (MISCELLANEOUS) ×1 IMPLANT
SUT MAXON ABS #0 GS21 30IN (SUTURE) IMPLANT
SUT MNCRL 4-0 (SUTURE) ×1
SUT MNCRL 4-0 27XMFL (SUTURE) ×1
SUT PROLENE 3 0 SH DA (SUTURE) IMPLANT
SUT VIC AB 0 CT1 36 (SUTURE) ×2 IMPLANT
SUT VIC AB 0 CTX 36 (SUTURE) ×2
SUT VIC AB 0 CTX36XBRD ANBCTRL (SUTURE) ×2 IMPLANT
SUT VIC AB 2-0 CT2 27 (SUTURE) IMPLANT
SUT VIC AB 2-0 SH 27 (SUTURE) ×2
SUT VIC AB 2-0 SH 27XBRD (SUTURE) ×2 IMPLANT
SUTURE MNCRL 4-0 27XMF (SUTURE) ×1 IMPLANT
SYR 30ML LL (SYRINGE) ×2 IMPLANT
TRAP FLUID SMOKE EVACUATOR (MISCELLANEOUS) ×1 IMPLANT
WATER STERILE IRR 500ML POUR (IV SOLUTION) ×1 IMPLANT

## 2021-11-17 NOTE — Discharge Summary (Signed)
Patient ID: Kylie [name] MRN: [id number] DOB/AGE: [birth year] 31 y.o.  Admit date: 11/11/2021 Discharge date: 11/12/2021  Admission Diagnoses: [redacted] weeks pregnant, vaginal bleeding, vaginal discharge  Discharge Diagnoses: cervical mass, preterm uterine contractions, [redacted] weeks pregnant  Prenatal Care Site: [facility]  Prenatal Procedures: samples of cervical mass sent to pathology by Dr. Leafy Ro  Consults: None  Significant Diagnostic Studies:  Results for orders placed or performed during the hospital encounter of 11/11/21 (from the past 168 hour(s))  Wet prep, genital   Collection Time: 11/11/21 12:37 PM  Result Value Ref Range   Yeast Wet Prep HPF POC NONE SEEN NONE SEEN   Trich, Wet Prep NONE SEEN NONE SEEN   Clue Cells Wet Prep HPF POC NONE SEEN NONE SEEN   WBC, Wet Prep HPF POC >=10 (A) <10   Sperm NONE SEEN   ROM Plus ([facility] only)   Collection Time: 11/11/21 12:37 PM  Result Value Ref Range   Rom Plus NEGATIVE   Urinalysis, Routine w reflex microscopic   Collection Time: 11/11/21 12:37 PM  Result Value Ref Range   Color, Urine YELLOW (A) YELLOW   APPearance HAZY (A) CLEAR   Specific Gravity, Urine 1.011 1.005 - 1.030   pH 7.0 5.0 - 8.0   Glucose, UA NEGATIVE NEGATIVE mg/dL   Hgb urine dipstick SMALL (A) NEGATIVE   Bilirubin Urine NEGATIVE NEGATIVE   Ketones, ur NEGATIVE NEGATIVE mg/dL   Protein, ur NEGATIVE NEGATIVE mg/dL   Nitrite NEGATIVE NEGATIVE   Leukocytes,Ua MODERATE (A) NEGATIVE   RBC / HPF 0-5 0 - 5 RBC/hpf   WBC, UA 6-10 0 - 5 WBC/hpf   Bacteria, UA RARE (A) NONE SEEN   Squamous Epithelial / LPF 0-5 0 - 5   Mucus PRESENT   Group B strep by PCR   Collection Time: 11/11/21  9:30 PM   Specimen: Vaginal/Rectal; Genital  Result Value Ref Range   Group B strep by PCR NEGATIVE NEGATIVE  CBC   Collection Time: 11/11/21  9:52 PM  Result Value Ref Range   WBC 22.2 (H) 4.0 - 10.5 K/uL   RBC 4.32 3.87 - 5.11 MIL/uL   Hemoglobin 12.4 12.0 - 15.0  g/dL   HCT 37.5 36.0 - 46.0 %   MCV 86.8 80.0 - 100.0 fL   MCH 28.7 26.0 - 34.0 pg   MCHC 33.1 30.0 - 36.0 g/dL   RDW 13.2 11.5 - 15.5 %   Platelets 300 150 - 400 K/uL   nRBC 0.0 0.0 - 0.2 %  Rapid HIV screen (HIV 1/2 Ab+Ag)   Collection Time: 11/11/21  9:52 PM  Result Value Ref Range   HIV-1 P24 Antigen - HIV24 NON REACTIVE NON REACTIVE   HIV 1/2 Antibodies NON REACTIVE NON REACTIVE   Interpretation (HIV Ag Ab)      A non reactive test result means that HIV 1 or HIV 2 antibodies and HIV 1 p24 antigen were not detected in the specimen.  Type and screen [place]   Collection Time: 11/11/21  9:52 PM  Result Value Ref Range   ABO/RH(D) O POS    Antibody Screen NEG    Sample Expiration      11/14/2021,2359 Performed at [facility], [address] [place], [place] [address]   ABO/Rh   Collection Time: 11/11/21 10:54 PM  Result Value Ref Range   ABO/RH(D)      O POS Performed at [facility], [place],  [place] [address]   [place] rt PCR ([place] only)   Collection Time: 11/11/21 11:18 PM   Specimen: Urine; GU  Result Value Ref Range   Specimen source GC/Chlam URINE, RANDOM    Chlamydia Tr NOT DETECTED NOT DETECTED   N gonorrhoeae NOT DETECTED NOT DETECTED    Treatments: consult with Dr. Leafy Ro to evaluate anterior cervical mass, sample sent to pathology  Hospital Course:  This is a 31 y.o. [id number] with IUP at 82w2dadmitted for a cervical mass on her anterior cervix, with cervical dilation and change noted after assessment, noted to have initial cervical exam of 2/thick/ballottable.  Vaginal bleeding noted after palpating cervical mass, clear gray discharge coming from cervical mass. After assessment of her cervical mass by both D. WRedmond Pulling CNM and Dr. BLeafy Ro she began contracting and made cervical change to 4/50/ballottable. Observed over 9 hours and contractions spaced out and she made no more cervical change.  She was deemed stable for discharge home with close outpatient follow-up.  Discharge Physical Exam:   BP 121/71 (BP Location: Left Arm)   Pulse 87   Temp 97.7 F (36.5 C) (Oral)   Resp 16   Ht '5\' 4"'$  (1.626 m)   Wt 91.6 kg   LMP 03/10/2021   BMI 34.67 kg/m   General: NAD CV: RRR Pulm: CTABL, nl effort ABD: s/nd/nt, gravid DVT Evaluation: LE non-ttp, no evidence of DVT on exam.  SVE: Dilation: 4 Effacement (%): 50 Station: Ballotable Exam by:: D Helayna Dun CNM Cervical mass unchanged Fetal monitoring: Baseline: 110 bpm, Variability: Good {> 6 bpm), Accelerations: Reactive, and Decelerations: Absent  TOCO: irregular, every 10-15 minutes; mild to palpation    Discharge Condition: Stable  Disposition: Discharge disposition: 01-Home or Self Care        Allergies as of 11/12/2021   No Known Allergies      Medication List     TAKE these medications    doxylamine (Sleep) 25 MG tablet Commonly known as: UNISOM Take 1 tablet (25 mg total) by mouth daily as needed.   ondansetron 4 MG disintegrating tablet Commonly known as: ZOFRAN-ODT Take 1 tablet (4 mg total) by mouth every 8 (eight) hours as needed for nausea or vomiting.   prenatal vitamin w/FE, FA 29-1 MG Chew chewable tablet Chew 1 tablet by mouth daily at 12 noon.   promethazine 12.5 MG tablet Commonly known as: PHENERGAN Take 12.5 mg by mouth every 6 (six) hours as needed for nausea or vomiting.        Follow-up Information     BBenjaman Kindler MD. Call in 5 day(s).   Specialty: Obstetrics and Gynecology Why: Schedule follow up on cervical mass with Dr. BSallyanne Kusterinformation: [id number] [phone]                Signed:  DGertie Fey8/19/2023 6:04 AM ----- DLucrezia Europe CNM Certified Nurse Midwife KPittstonAVa [facility]
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
## 2021-11-17 NOTE — Op Note (Addendum)
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  Procedure: Primary Classical Cesarean Section through Pfannenstiel incision; bilateral tubal sterilization by salpingectomy; bilateral ovarian transposition into the paracolic gutters with hemoclips placed on the medial borders of the ovaries to identify during radiation  Surgeon: BBenjaman Kindler MD  Assistant(s):  SPrentice Docker MD ; DLucrezia Europe CNM  Anesthesia: General endotracheal - Double lumen tube and Spinal anesthesia  Estimated Blood Loss:   850         Total IV Fluids: 20032m Urine Output: 10033m       Specimens: Cord blood; placenta         Complications:  None; patient tolerated the procedure well.         Disposition: PACU - hemodynamically stable.         Condition: stable  Findings:  A female infant "Brayden" in cephalic presentation. Amniotic fluid - Clear  Birth weight - 3060g.  Apgars- see peds report Intact placenta with a three-vessel cord.  Grossly normal uterus, tubes and ovaries bilaterally. Irregular pale mass consistent with malignancy in the posterior wall of the cervical canal below the lower uterine segment. No intraabdominal adhesions were noted.  Indications: Vaginal delivery contraindicated  Procedure Details  The patient was taken to Operating Room, identified as the correct patient and the procedure verified as C-Section Delivery. A formal Time Out was held with all team members present and in agreement.  After induction of spinal anesthesia, the patient was draped and prepped in the usual sterile manner. A Pfannenstiel skin incision was made and carried down through the subcutaneous tissue to the fascia. Fascial incision was made and extended transversely with the Mayo scissors. The  fascia was separated from the underlying rectus tissue superiorly and inferiorly. The peritoneum was identified and entered bluntly. Peritoneal incision was extended longitudinally. The utero-vesical peritoneal reflection was incised transversely and a bladder flap was created digitally. An Alexis self retaining retractor was placed.  A classical transverse hysterotomy was made, higher than a high transverse. The fetus was delivered atraumatically. The umbilical cord was clamped x2 and cut and the infant was handed to the awaiting pediatricians after 60 seconds. The placenta was removed intact and appeared normal, intact, and with a 3-vessel cord.   The uterus was exteriorized and cleared of all clot and debris. The hysterotomy was closed in a layered fashion with running sutures of 0-Vicryl. A superficial imbricating layer was placed with the same suture to bring the peritoneum across all raw edges. Excellent hemostasis was observed. The peritoneal cavity was cleared of all clots and debris.   The patient began to have significant back pain at this portion of the procedure, and general anesthesia was induced and her partner and infant left with the peds team.  Attention was turned to the fallopian tubes, and both were removed entirely using the hand-held LigaSure bipolar device.  The left ovarian ligament and vessels were divided and the mesovarium was dissected carefully away from the IP.  This was dissected down to just above the level of the pelvic brim.  The left ovary was sutured securely to the peritoneum lateral to the left paracolic gutter using 3-0 Prolene.  The lateral opening and the broad ligament caudal to left ovary was closed with running sutures of 3-0 Prolene.  A surgical Hemoclip  was applied to the medial border of this ovary to help identify the site of the transposed ovary in the future.  Several figure-of-eight sutures using 0 Vicryl were placed along the lateral left uterus to ensure  hemostasis.  The same procedure was carried out on the right, being careful to avoid the ureter and to allow the IP not to be kinked.  On this side the peritoneum was closed to avoid internal herniation as well.  Hysterotomy was again examined and noted to be hemostatic.  The uterus was returned to the abdomen.   The pelvis was irrigated and again, excellent hemostasis was noted. The fascia was then reapproximated with running sutures of 0 Vicryl.  The subcutaneous tissue was reapproximated with running sutures of 2-0 vicryl. The skin was reapproximated with a 4-0 Monocryl subcuticular stitch.  Bupivacaine was infused along the skin lines.  A pressure dressing was placed over Steri-Strips.  Instrument, sponge, and needle counts were correct prior to the abdominal closure and at the conclusion of the case.   The patient tolerated the procedure well and was transferred to the recovery room in stable condition.   Benjaman Kindler, MD 11/17/2021

## 2021-11-17 NOTE — Anesthesia Procedure Notes (Signed)
Date/Time: 11/17/2021 2:08 PM  Performed by: Johnna Acosta, CRNAPre-anesthesia Checklist: Patient identified, Suction available, Emergency Drugs available, Patient being monitored and Timeout performed Patient Re-evaluated:Patient Re-evaluated prior to induction Oxygen Delivery Method: Nasal cannula Preoxygenation: Pre-oxygenation with 100% oxygen Induction Type: IV induction

## 2021-11-17 NOTE — Anesthesia Preprocedure Evaluation (Addendum)
Anesthesia Evaluation  Patient identified by MRN, date of birth, ID band Patient awake    Reviewed: Allergy & Precautions, NPO status , Patient's Chart, lab work & pertinent test results  History of Anesthesia Complications Negative for: history of anesthetic complications  Airway Mallampati: III   Neck ROM: Full    Dental  (+) Missing   Pulmonary former smoker (quit 2021),    Pulmonary exam normal breath sounds clear to auscultation       Cardiovascular Exercise Tolerance: Good negative cardio ROS Normal cardiovascular exam Rhythm:Regular Rate:Normal     Neuro/Psych  Headaches, PSYCHIATRIC DISORDERS Anxiety Depression Daily marijuana use    GI/Hepatic GERD  ,  Endo/Other  negative endocrine ROS  Renal/GU negative Renal ROS     Musculoskeletal   Abdominal   Peds  Hematology negative hematology ROS (+)   Anesthesia Other Findings   Reproductive/Obstetrics Cervical CA                            Anesthesia Physical Anesthesia Plan  ASA: 3  Anesthesia Plan: Spinal   Post-op Pain Management:    Induction:   PONV Risk Score and Plan: 2 and Ondansetron and Treatment may vary due to age or medical condition  Airway Management Planned: Natural Airway and Nasal Cannula  Additional Equipment:   Intra-op Plan:   Post-operative Plan:   Informed Consent: I have reviewed the patients History and Physical, chart, labs and discussed the procedure including the risks, benefits and alternatives for the proposed anesthesia with the patient or authorized representative who has indicated his/her understanding and acceptance.     Dental Advisory Given  Plan Discussed with: Anesthesiologist, CRNA and Surgeon  Anesthesia Plan Comments: (Patient reports no bleeding problems and no anticoagulant use.  Plan for spinal with backup GA.  Patient consented for risks of anesthesia including but  not limited to:  - adverse reactions to medications - damage to eyes, teeth, lips or other oral mucosa - nerve damage due to positioning  - risk of bleeding, infection and or nerve damage from spinal that could lead to paralysis - risk of headache or failed spinal - damage to teeth, lips or other oral mucosa - sore throat or hoarseness - damage to heart, brain, nerves, lungs, other parts of body or loss of life  Patient voiced understanding.)        Anesthesia Quick Evaluation

## 2021-11-17 NOTE — Anesthesia Postprocedure Evaluation (Signed)
Anesthesia Post Note  Patient: Jairy Angulo  Procedure(s) Performed: CLASSICAL CESAREAN SECTION WITH BILATERAL TUBAL LIGATION BY SALPINGECTOMY AND BILATERAL OOPHEROPEXY (Bilateral)  Patient location during evaluation: PACU Anesthesia Type: Spinal and General Level of consciousness: awake and alert Pain management: pain level controlled Vital Signs Assessment: post-procedure vital signs reviewed and stable Respiratory status: spontaneous breathing, nonlabored ventilation and respiratory function stable Cardiovascular status: blood pressure returned to baseline and stable Postop Assessment: no apparent nausea or vomiting Anesthetic complications: no   No notable events documented.   Last Vitals:  Vitals:   11/17/21 1800 11/17/21 1820  BP:  112/74  Pulse: (!) 52 (!) 50  Resp: 13 15  Temp:  36.7 C  SpO2: 99% 97%    Last Pain:  Vitals:   11/17/21 1820  TempSrc: Oral  PainSc:                  [place] Ouch

## 2021-11-17 NOTE — Transfer of Care (Signed)
Immediate Anesthesia Transfer of Care Note  Patient: Kylie [name]  Procedure(s) Performed: CLASSICAL CESAREAN SECTION WITH BILATERAL TUBAL LIGATION BY SALPINGECTOMY AND BILATERAL OOPHEROPEXY (Bilateral)  Patient Location: PACU  Anesthesia Type:General and Spinal  Level of Consciousness: awake and alert   Airway & Oxygen Therapy: Patient Spontanous Breathing  Post-op Assessment: Report given to RN and Post -op Vital signs reviewed and stable  Post vital signs: Reviewed  Last Vitals:  Vitals Value Taken Time  BP    Temp    Pulse 76 11/17/21 1724  Resp    SpO2 100 % 11/17/21 1724  Vitals shown include unvalidated device data.  Last Pain:  Vitals:   11/17/21 1112  TempSrc:   PainSc: 0-No pain         Complications: No notable events documented.

## 2021-11-17 NOTE — Progress Notes (Signed)
Patient arrival to pacu, awake/alert. Moving bil lower ext without event, pulses intact. Abd with dressing c/d/I, fundal check per gyn/ob. Pitocin infusing as ordered by ob/gyn at 41.24m/hr Patient tolerating fluids without event. Transferred to ob/gyn room 345, report [facility] to [facility]

## 2021-11-17 NOTE — Anesthesia Procedure Notes (Signed)
Procedure Name: Intubation Date/Time: 11/17/2021 3:18 PM  Performed by: Johnna Acosta, CRNAPre-anesthesia Checklist: Patient identified, Emergency Drugs available, Suction available, Patient being monitored and Timeout performed Patient Re-evaluated:Patient Re-evaluated prior to induction Oxygen Delivery Method: Circle system utilized Preoxygenation: Pre-oxygenation with 100% oxygen Induction Type: IV induction, Rapid sequence and Cricoid Pressure applied Laryngoscope Size: McGraph and 3 Grade View: Grade I Tube type: Oral Tube size: 6.0 mm Number of attempts: 1 Airway Equipment and Method: Stylet and Video-laryngoscopy Placement Confirmation: ETT inserted through vocal cords under direct vision, positive ETCO2 and breath sounds checked- equal and bilateral Secured at: 19 cm Tube secured with: Tape Dental Injury: Teeth and Oropharynx as per pre-operative assessment

## 2021-11-17 NOTE — Anesthesia Procedure Notes (Signed)
Spinal  Patient location during procedure: OR Start time: 11/17/2021 2:10 PM End time: 11/17/2021 2:20 PM Reason for block: surgical anesthesia Staffing Performed: resident/CRNA  Anesthesiologist: Darrin Nipper, MD Resident/CRNA: Johnna Acosta, CRNA Performed by: Johnna Acosta, CRNA Authorized by: Darrin Nipper, MD   Preanesthetic Checklist Completed: patient identified, IV checked, site marked, risks and benefits discussed, surgical consent, monitors and equipment checked, pre-op evaluation and timeout performed Spinal Block Patient position: sitting Prep: ChloraPrep Patient monitoring: heart rate, continuous pulse ox, blood pressure and cardiac monitor Approach: midline Location: L3-4 Injection technique: single-shot Needle Needle type: Whitacre and Introducer  Needle gauge: 24 G Needle length: 9 cm Assessment Sensory level: T10 Events: CSF return Additional Notes Sterile aseptic technique used throughout the procedure.  Negative paresthesia. Negative blood return. Positive free-flowing CSF. Expiration date of kit checked and confirmed. Patient tolerated procedure well, without complications.

## 2021-11-18 ENCOUNTER — Other Ambulatory Visit: Payer: Self-pay

## 2021-11-18 ENCOUNTER — Encounter: Payer: Self-pay | Admitting: Obstetrics and Gynecology

## 2021-11-18 ENCOUNTER — Telehealth: Payer: Self-pay

## 2021-11-18 LAB — CBC
HCT: 30.5 % — ABNORMAL LOW (ref 36.0–46.0)
Hemoglobin: 9.9 g/dL — ABNORMAL LOW (ref 12.0–15.0)
MCH: 28.9 pg (ref 26.0–34.0)
MCHC: 32.5 g/dL (ref 30.0–36.0)
MCV: 89.2 fL (ref 80.0–100.0)
Platelets: 237 10*3/uL (ref 150–400)
RBC: 3.42 MIL/uL — ABNORMAL LOW (ref 3.87–5.11)
RDW: 13.2 % (ref 11.5–15.5)
WBC: 20.9 10*3/uL — ABNORMAL HIGH (ref 4.0–10.5)
nRBC: 0 % (ref 0.0–0.2)

## 2021-11-18 LAB — RPR: RPR Ser Ql: NONREACTIVE

## 2021-11-18 MED ORDER — DIPHENHYDRAMINE HCL 25 MG PO CAPS: 25.0000 mg | ORAL_CAPSULE | Freq: Four times a day (QID) | ORAL | Status: DC | PRN

## 2021-11-18 MED ORDER — ACETAMINOPHEN 500 MG PO TABS
1000.0000 mg | ORAL_TABLET | Freq: Four times a day (QID) | ORAL | Status: DC
  Administered 2021-11-18 – 2021-11-19 (×5): 1000 mg via ORAL
  Filled 2021-11-18 (×5): qty 2

## 2021-11-18 MED ORDER — LACTATED RINGERS IV BOLUS
500.0000 mL | INTRAVENOUS | Status: AC
  Administered 2021-11-18: 500 mL via INTRAVENOUS

## 2021-11-18 NOTE — Progress Notes (Signed)
Post Partum Day 1  Subjective: Doing well, no concerns. Ambulating without difficulty, pain managed with PO meds, tolerating regular diet, and voiding without difficulty.   No fever/chills, chest pain, shortness of breath, nausea/vomiting, or leg pain. No nipple or breast pain. No headache, visual changes, or RUQ/epigastric pain.  Objective: BP 120/80 (BP Location: Right Arm)   Pulse 61   Temp 98.2 F (36.8 C) (Oral)   Resp 20   Ht '5\' 4"'$  (1.626 m)   Wt 94.5 kg   LMP 03/10/2021   SpO2 100%   Breastfeeding Unknown   BMI 35.77 kg/m    Physical Exam:  General: alert and cooperative Breasts: soft/nontender CV: RRR Pulm: nl effort Abdomen: soft, non-tender Uterine Fundus: firm Incision: no significant drainage, pressure dressing on c/d/i Perineum: intact Lochia: appropriate DVT Evaluation: No evidence of DVT seen on physical exam. Edinburgh:      No data to display           Recent Labs    11/17/21 1900 11/18/21 0412  HGB 11.2* 9.9*  HCT 34.1* 30.5*  WBC 20.8* 20.9*  PLT 234 237    Assessment/Plan: 31 y.o. [id number] postpartum day # 1  Decreased output - IVF bolus started around 0530 1901-0700 370m 0701-0800 250 0801-0900 100  -Continue routine postpartum care -Lactation consult PRN for breastfeeding  -Acute blood loss anemia - hemodynamically stable and asymptomatic; start PO ferrous sulfate BID with stool softeners  -Immunization status:  all immunizations up to date  Disposition: Continue inpatient postpartum care    LOS: 1 day   Otho Michalik, CNM 11/18/2021, 8:10 AM

## 2021-11-18 NOTE — Telephone Encounter (Signed)
PET scheduled for 2 weeks following 11/17/21 C-section. Will call Ms. Jefcoat with instructions.

## 2021-11-18 NOTE — Clinical Social Work Maternal (Signed)
CLINICAL SOCIAL WORK MATERNAL/CHILD NOTE  Patient Details  Name: Kylie [name] MRN: [id number] Date of Birth: [birth year]  Date:  11/18/2021  Clinical Social Worker Initiating Note:  Doran Clay RN BSN Case Manager Date/Time: Initiated:  11/18/21/1044     Child's Name:  Kylie [name]   Biological Parents:  Mother, Father   Need for Interpreter:  None   Reason for Referral:  Current Substance Use/Substance Use During Pregnancy     Address:  [place] [address]    Phone number:  [phone] (home)     Additional phone number: NA  Household Members/Support Persons (HM/SP):   Household Member/Support Person 1, Household Member/Support Person 2, Household Member/Support Person 3, Household Member/Support Person 4, Household Member/Support Person 5   HM/SP Name Relationship DOB or Age  HM/SP -1 Kylie [name] Significant other unknown  HM/SP -2 Kylie [name] son 76  HM/SP -3 Kylie [name] daughter 69  HM/SP -4 Kylie [name] son 29  HM/SP -Shedd son 7  HM/SP -6        HM/SP -7        HM/SP -8          Natural Supports (not living in the home):  Extended Family   Professional Supports:     Employment: Unemployed   Type of Work:     Education:      Homebound arranged:    Museum/gallery curator Resources:  Medicaid   Other Resources:  Arts development officer Considerations Which May Impact Care:   NA  Strengths:  Ability to meet basic needs  , Home prepared for child  , Pediatrician chosen   Psychotropic Medications:         Pediatrician:    [employer]  Pediatrician List:   [place] Other ([facility])  [facility]      Pediatrician Fax Number:    Risk Factors/Current Problems:  Substance Use     Cognitive State:  Alert  , Able to Concentrate     Mood/Affect:  Happy  , Calm  , Comfortable     CSW Assessment: TOC consult  received for drug exposed newborn.  Patient tested positive for marijuana on UDS and so did infant.  RNCM met with patient at the bedside introduced self and explained reason for visit, patient verbalized understanding. Patient reports that she was so nauseous during her pregnancy that she was using marijuana to treat the nausea, she reports to start is was twice a week and then only smoking once a week.  She reports her significant other does not smoke marijuana but does vape, she never smokes in front of her children, and she did not smoke before she got pregnant.  Patient denies any other substance use.  Patient lives at home with her 4 children and her significant other, she is a stay at home mom, significant other works as a Dealer at a [employer] in [place].  The home is prepared for the infant, they have a car seat, she has chosen a Lexicographer, all of her children go to [facility] so she will schedule an appointment with them for newborn.   Patient gets food stamps but not WIC currently, she does intend to schedule an appointment at the [facility] office.    RNCM explained that CPS report will be made as required by [facility]  Health Policy.  Patient verbalized understanding.  No other concerns at this time. CPS report called in to [facility].    CSW Plan/Description:  Child Protective Service Report      Shelbie Hutching, RN 11/18/2021, 10:47 AM

## 2021-11-19 MED ORDER — ACETAMINOPHEN 500 MG PO TABS: 1000.0000 mg | ORAL_TABLET | Freq: Four times a day (QID) | ORAL | 0 refills | Status: DC

## 2021-11-19 MED ORDER — IBUPROFEN 600 MG PO TABS: 600.0000 mg | ORAL_TABLET | Freq: Four times a day (QID) | ORAL | 1 refills | Status: DC

## 2021-11-19 MED ORDER — OXYCODONE HCL 5 MG PO TABS: 5.0000 mg | ORAL_TABLET | ORAL | 0 refills | Status: AC | PRN
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
## 2021-11-19 NOTE — Discharge Instructions (Signed)
Cesarean Delivery, Care After Refer to this sheet in the next few weeks. These instructions provide you with information on caring for yourself after your procedure. Your health care provider may also give you specific instructions. Your treatment has been planned according to current medical practices, but problems sometimes occur. Call your health care provider if you have any problems or questions after you go home. HOME CARE INSTRUCTIONS  Please leave honey comb dressing (OP Site) on for 7 days.  You may shower during this period but turn your back to the water so that the dressing does not get directly saturated by the water.   You may take the dressing off on day 7.  The easiest way to do it is in the shower.  Allow the water to run over the dressing and it usually comes off easier.   Only take over-the-counter or prescription medications as directed by your health care provider. Do not drink alcohol, especially if you are breastfeeding or taking medication to relieve pain. Do not  smoke tobacco. Continue to use good perineal care. Good perineal care includes: Wiping your perineum from front to back. Keeping your perineum clean. Check your surgical cut (incision) daily for increased redness, drainage, swelling, or separation of skin. Shower and clean your incision gently with soap and water every day, by letting warm and soapy water run over the incision, and then pat it dry. If your health care provider says it is okay, leave the incision uncovered. Use a bandage (dressing) if the incision is draining fluid or appears irritated. If the adhesive strips across the incision do not fall off within 7 days, carefully peel them off, after a shower. Hug a pillow when coughing or sneezing until your incision is healed. This helps to relieve pain. Do not use tampons, douches or have sexual intercourse, until your health care provider says it is okay. Wear a well-fitting bra that provides breast  support. Limit wearing support panties or control-top hose. Drink enough fluids to keep your urine clear or pale yellow. Eat high-fiber foods such as whole grain cereals and breads, brown rice, beans, and fresh fruits and vegetables every day. These foods may help prevent or relieve constipation. Resume activities such as climbing stairs, driving, lifting, exercising, or traveling as directed by your health care provider. Try to have someone help you with your household activities and your newborn for at least a few days after you leave the hospital. Rest as much as possible. Try to rest or take a nap when your newborn is sleeping. Increase your activities gradually. Do not lift more than 15lbs until directed by a provider. Keep all of your scheduled postpartum appointments. It is very important to keep your scheduled follow-up appointments. At these appointments, your health care provider will be checking to make sure that you are healing physically and emotionally. SEEK MEDICAL CARE IF:  You are passing large clots from your vagina. Save any clots to show your health care provider. You have a foul smelling discharge from your vagina. You have trouble urinating. You are urinating frequently. You have pain when you urinate. You have a change in your bowel movements. You have increasing redness, pain, or swelling near your incision. You have pus draining from your incision. Your incision is separating. You have painful, hard, or reddened breasts. You have a severe headache. You have blurred vision or see spots. You feel sad or depressed. You have thoughts of hurting yourself or your newborn. You have questions   about your care, the care of your newborn, or medications. You are dizzy or light-headed. You have a rash. You have pain, redness, or swelling at the site of the removed intravenous access (IV) tube. You have nausea or vomiting. You stopped breastfeeding and have not had a  menstrual period within 12 weeks of stopping. You are not breastfeeding and have not had a menstrual period within 12 weeks of delivery. You have a fever. SEEK IMMEDIATE MEDICAL CARE IF: You have persistent pain. You have chest pain. You have shortness of breath. You faint. You have leg pain. You have stomach pain. Your vaginal bleeding saturates 2 or more sanitary pads in 1 hour. MAKE SURE YOU:  Understand these instructions. Will watch your condition. Will get help right away if you are not doing well or get worse. Document Released: 12/03/2001 Document Revised: 07/28/2013 Document Reviewed: 11/08/2011 ExitCare Patient Information 2015 [employer]. This information is not intended to replace advice given to you by your health care provider. Make sure you discuss any questions you have with your health care provider.  

## 2021-11-21 ENCOUNTER — Other Ambulatory Visit: Payer: Self-pay

## 2021-11-21 LAB — SURGICAL PATHOLOGY

## 2021-12-01 ENCOUNTER — Ambulatory Visit
Admit: 2021-12-01 | Discharge: 2021-12-01 | Disposition: A | Discharge status: Home / Self Care | Payer: Medicaid Other | Attending: Nurse Practitioner | Admitting: Nurse Practitioner

## 2021-12-01 VITALS — Ht 64.0 in | Wt 186.0 lb

## 2021-12-01 DIAGNOSIS — C539 Malignant neoplasm of cervix uteri, unspecified: Secondary | ICD-10-CM | POA: Diagnosis present

## 2021-12-01 LAB — GLUCOSE, CAPILLARY: Glucose-Capillary: 109 mg/dL — ABNORMAL HIGH (ref 70–99)

## 2021-12-01 MED ORDER — FLUDEOXYGLUCOSE F - 18 (FDG) INJECTION
9.6000 | Freq: Once | INTRAVENOUS | Status: AC | PRN
  Administered 2021-12-01: 8.53 via INTRAVENOUS

## 2021-12-05 ENCOUNTER — Other Ambulatory Visit: Payer: Self-pay

## 2021-12-05 DIAGNOSIS — C539 Malignant neoplasm of cervix uteri, unspecified: Secondary | ICD-10-CM

## 2021-12-05 NOTE — Progress Notes (Signed)
Dr. Fransisca Connors has reviewed PET scan. Referrals placed for medical and radiation oncology for IB3 cervical cancer. Called and reviewed PET with Ms. Dickmann.

## 2021-12-06 ENCOUNTER — Inpatient Hospital Stay: Payer: Medicaid Other | Attending: Hematology and Oncology | Admitting: Internal Medicine

## 2021-12-06 ENCOUNTER — Encounter: Payer: Self-pay | Admitting: Radiation Oncology

## 2021-12-06 ENCOUNTER — Ambulatory Visit
Admission: RE | Admit: 2021-12-06 | Discharge: 2021-12-06 | Disposition: A | Discharge status: Home / Self Care | Payer: Medicaid Other | Source: Ambulatory Visit | Attending: Radiation Oncology | Admitting: Radiation Oncology

## 2021-12-06 ENCOUNTER — Encounter: Payer: Self-pay | Admitting: Internal Medicine

## 2021-12-06 VITALS — HR 78 | Temp 98.4°F | Resp 16 | Wt 184.4 lb

## 2021-12-06 VITALS — BP 104/73 | HR 80 | Temp 98.6°F | Resp 16 | Ht 64.0 in | Wt 184.4 lb

## 2021-12-06 DIAGNOSIS — Z87891 Personal history of nicotine dependence: Secondary | ICD-10-CM | POA: Insufficient documentation

## 2021-12-06 DIAGNOSIS — F1290 Cannabis use, unspecified, uncomplicated: Secondary | ICD-10-CM | POA: Diagnosis not present

## 2021-12-06 DIAGNOSIS — C539 Malignant neoplasm of cervix uteri, unspecified: Secondary | ICD-10-CM | POA: Insufficient documentation

## 2021-12-06 MED ORDER — PROCHLORPERAZINE MALEATE 10 MG PO TABS: 10.0000 mg | ORAL_TABLET | Freq: Four times a day (QID) | ORAL | 1 refills | Status: DC | PRN

## 2021-12-06 MED ORDER — ONDANSETRON HCL 8 MG PO TABS: 8.0000 mg | ORAL_TABLET | Freq: Three times a day (TID) | ORAL | 1 refills | Status: DC | PRN

## 2021-12-06 NOTE — Progress Notes (Signed)
Patient here today for new evaluation regarding cervical cancer, seen by Dr. Baruch Gouty as well.

## 2021-12-06 NOTE — Progress Notes (Signed)
START OFF PATHWAY REGIMEN - Other   [id number]:Cisplatin 40 mg/m2 IV D1 q7 Days + RT:   A cycle is every 7 days:     Cisplatin   **Always confirm dose/schedule in your pharmacy ordering system**  Patient Characteristics: Intent of Therapy: Curative Intent, Discussed with Patient 

## 2021-12-06 NOTE — Progress Notes (Signed)
[facility] CONSULT NOTE  Patient Care Team: Pcp, No as PCP - General Linda Hedges, CNM as PCP - OBGYN (Certified Nurse Midwife) Clent Jacks, RN as Oncology Nurse Navigator  REFERRING PROVIDER: Dr. Fransisca Connors   REASON FOR REFFERAL: newly diagnosed cervical cancer   CANCER STAGING   Cancer Staging  Cervical cancer, FIGO stage IB ([place]) Staging form: Cervix Uteri, AJCC Version 9 - Clinical stage from 11/15/2021: FIGO Stage IB3 (cT1b3, cN0, cM0) - Signed by Jane Canary, MD on 12/06/2021 Histopathologic type: Squamous cell carcinoma, NOS Stage prefix: Initial diagnosis   ASSESSMENT & PLAN:  Kylie [name] 31 y.o. 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  # Invasive SCCa of cervix, FIGO Stage IB3  - diagnosed 11/15/21  - PET CT scan reviewed from 12/01/2021 which showed bulky soft tissue mass involving the cervix uteri measuring 7.3 x 1.5 cm SUV of 24, no hypermetabolic lymph nodes seen.  We discussed the role of chemotherapy. The intent is of curative intent. We discussed some of the risks, benefits, side-effects of cisplatin and its role as chemo sensitizing agent. The plan for weekly cisplatin for x5 doses along with radiation treatment.  Some of the short term side-effects included, though not limited to, including weight loss, life threatening infections, risk of allergic reactions, need for transfusions of blood products, nausea, vomiting, change in bowel habits, loss of hair, admission to hospital for various reasons, and risks of death. Long term side-effects are also discussed including risks of infertility, permanent damage to nerve function, hearing loss, chronic fatigue The patient is aware that the response rates discussed earlier is not guaranteed.  After a long  discussion, patient made an informed decision to proceed with the prescribed plan of care.  Patient does not have underlying hearing issues so we will hold off on audiometry.   Schedule chemo teach session.  Patient had consultation with Dr. Donella Stade today.  CT simulation is planned for 9/21.  Plan to start EBRT on either 9/29 or 10/2.  I will schedule her for cycle 1 cisplatin on 9/29.  We can reschedule if needed.  Treatment plan added. Antiemetics sent.  Patient confirmed she is not breast-feeding.    # Postpartum  - had C section on 8/25. Follows with Dr. Leafy Ro.    No orders of the defined types were placed in this encounter.   The total time spent in the appointment was 60 minutes encounter with patients including review of chart and various tests results, discussions about plan of care and coordination of care plan   All questions were answered. The patient knows to call the clinic with any problems, questions or concerns. No barriers to learning was detected.  Jane Canary, MD 9/12/20232:22 PM   HISTORY OF PRESENTING ILLNESS:  Kylie [name] 31 y.o. female with pmh of anxiety Patient presented by herself for the appointment. Patient denies fever, chills, nausea, vomiting, shortness of breath, cough, abdominal pain, bleeding, bowel or bladder issues.  No kidney issues, hearing problems or neuropathy. She is not breast-feeding.  She has some vaginal discharge.  Not planning for more children.  I have reviewed her chart and materials related to her cancer extensively and collaborated history with the patient. Summary of oncologic history is as follows: Oncology History  Cervical cancer, FIGO stage IB ([place])  11/08/2021 Initial Diagnosis   Patient [id number] 35w pregnant admitted on 11/11/2021 by Dr. Leafy Ro for cervical mass, preterm contractions, vaginal bleeding and discharge.  Sample of cervical mass were taken and sent to pathology.   11/15/2021 Cancer Staging   Staging  form: Cervix Uteri, AJCC Version 9 - Clinical stage from 11/15/2021: FIGO Stage IB3 (cT1b3, cN0, cM0) - Signed by Jane Canary, MD on 12/06/2021 Histopathologic type: Squamous cell carcinoma, NOS Stage prefix: Initial diagnosis   11/15/2021 Pathology Results   DIAGNOSIS:  A.  CERVIX; BIOPSY:  - INVASIVE SQUAMOUS CELL CARCINOMA.  - SEE COMMENT.   Comment:  The biopsy fragments demonstrate moderately differentiated invasive squamous cell carcinoma.  Due to tangential sectioning the depth of invasion cannot be determined.    11/18/2021 Procedure   She delivered . S/p primary Classical Cesarean Section through Pfannenstiel incision; bilateral tubal sterilization by salpingectomy; bilateral ovarian transposition into the paracolic gutters with hemoclips placed on the medial borders of the ovaries to identify during radiation   12/23/2021 -  Chemotherapy   Patient is on Treatment Plan : CERVICAL Cisplatin (40) q7d + XRT       MEDICAL HISTORY:  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, doing better now   Headache(784.0)    UTI (lower urinary tract infection)     SURGICAL HISTORY: Past Surgical History:  Procedure Laterality Date   CESAREAN SECTION WITH BILATERAL TUBAL LIGATION Bilateral 11/17/2021   Procedure: CLASSICAL CESAREAN SECTION WITH BILATERAL TUBAL LIGATION BY SALPINGECTOMY AND BILATERAL OOPHEROPEXY;  Surgeon: Benjaman Kindler, MD;  Location: [facility];  Service: Obstetrics;  Laterality: Bilateral;   HAND SURGERY Left    ORIF ELBOW FRACTURE Right 12/02/2012   Procedure: OPEN REDUCTION INTERNAL FIXATION (ORIF) ELBOW/OLECRANON FRACTURE;  Surgeon: Schuyler Amor, MD;  Location: [facility];  Service: Orthopedics;  Laterality: Right;    SOCIAL HISTORY: Social History   Socioeconomic History   Marital status: Significant Other    Spouse name: Not on file   Number of children: Not on file   Years of education: Not on file    Highest education level: Not on file  Occupational History   Not on file  Tobacco Use   Smoking status: Former    Packs/day: 0.25    Years: 1.00    Total pack years: 0.25    Types: Cigarettes    Quit date: 03/28/2019    Years since quitting: 2.6   Smokeless tobacco: Never   Tobacco comments:    electronic- e-sigs-mostly  Vaping Use   Vaping Use: Former   Substances: Nicotine, THC, Flavoring  Substance and Sexual Activity   Alcohol use: Not Currently    Comment: rarely   Drug use: Yes    Types: Marijuana    Comment: Delta 8 THC, Jul 25 2021   Sexual activity: Yes    Birth control/protection: None  Other Topics Concern   Not on file  Social History Narrative   Not on file   Social Determinants of Health   Financial Resource Strain: Not on file  Food Insecurity: Not on file  Transportation Needs: Not on file  Physical Activity: Not on file  Stress: Not on file  Social Connections: Not on file  Intimate Partner Violence: Not on file    FAMILY HISTORY: Family History  Problem Relation Age of Onset   Hyperlipidemia Father    Hypertension Father    Cancer Paternal  Grandmother    Kidney disease Paternal Grandmother    Diabetes Paternal Grandfather    Cancer Paternal Aunt    Hearing loss Neg Hx     ALLERGIES:  is allergic to cat hair extract.  MEDICATIONS:  Current Outpatient Medications  Medication Sig Dispense Refill   acetaminophen (TYLENOL) 500 MG tablet Take 2 tablets (1,000 mg total) by mouth every 6 (six) hours. 30 tablet 0   diphenhydrAMINE (BENADRYL) 25 mg capsule Take by mouth. (Patient not taking: Reported on 11/16/2021)     ibuprofen (ADVIL) 600 MG tablet Take 1 tablet (600 mg total) by mouth every 6 (six) hours. (Patient not taking: Reported on 12/06/2021) 60 tablet 1   ondansetron (ZOFRAN) 8 MG tablet Take 1 tablet (8 mg total) by mouth every 8 (eight) hours as needed for nausea or vomiting. Take one tablet ('8mg'$  total) by mouth every 8hrs as needed. Start  on the third day after cisplatin chemotherapy. 30 tablet 1   ondansetron (ZOFRAN-ODT) 4 MG disintegrating tablet Take 1 tablet (4 mg total) by mouth every 8 (eight) hours as needed for nausea or vomiting. 30 tablet 0   Prenatal Vit-Fe Fumarate-FA (PRENATAL MULTIVITAMIN) TABS tablet Take by mouth.     prenatal vitamin w/FE, FA (NATACHEW) 29-1 MG CHEW chewable tablet Chew 1 tablet by mouth daily at 12 noon.     prochlorperazine (COMPAZINE) 10 MG tablet Take 1 tablet (10 mg total) by mouth every 6 (six) hours as needed for nausea or vomiting. 30 tablet 1   No current facility-administered medications for this visit.    REVIEW OF SYSTEMS:   Pertinent information mentioned in HPI All other systems were reviewed with the patient and are negative.  PHYSICAL EXAMINATION: ECOG PERFORMANCE STATUS: 0 - Asymptomatic  Vitals:   12/06/21 1358  Pulse: 78  Resp: 16  Temp: 98.4 F (36.9 C)   Filed Weights   12/06/21 1358  Weight: 184 lb 6.4 oz (83.6 kg)    GENERAL:alert, no distress and comfortable SKIN: skin color, texture, turgor are normal, no rashes or significant lesions EYES: normal, conjunctiva are pink and non-injected, sclera clear OROPHARYNX:no exudate, no erythema and lips, buccal mucosa, and tongue normal  NECK: supple, thyroid normal size, non-tender, without nodularity LYMPH:  no palpable lymphadenopathy in the cervical, axillary or inguinal LUNGS: clear to auscultation and percussion with normal breathing effort HEART: regular rate & rhythm and no murmurs and no lower extremity edema ABDOMEN:abdomen soft, non-tender and normal bowel sounds Musculoskeletal:no cyanosis of digits and no clubbing  PSYCH: alert & oriented x 3 with fluent speech NEURO: no focal motor/sensory deficits  LABORATORY DATA:  I have reviewed the data as listed Lab Results  Component Value Date   WBC 20.9 (H) 11/18/2021   HGB 9.9 (L) 11/18/2021   HCT 30.5 (L) 11/18/2021   MCV 89.2 11/18/2021   PLT  237 11/18/2021   Recent Labs    04/23/21 1148 05/11/21 2052 11/17/21 1100 11/17/21 1900  NA 134* 134* 135  --   K 3.6 3.7 3.8  --   CL 101 98 108  --   CO2 20* 21* 21*  --   GLUCOSE 79 70 89  --   BUN 13 12 <5*  --   CREATININE 0.74 0.69 0.65 0.54  CALCIUM 9.7 9.7 8.9  --   GFRNONAA >60 >60 >60 >60  PROT 8.1 7.8 7.2  --   ALBUMIN 4.7 4.4 2.7*  --   AST '16 16 21  '$ --  ALT '20 20 22  '$ --   ALKPHOS 58 55 135*  --   BILITOT 1.2 1.1 0.7  --     RADIOGRAPHIC STUDIES: I have personally reviewed the radiological images as listed and agreed with the findings in the report. NM PET Image Initial (PI) Skull Base To Thigh  Result Date: 12/02/2021 CLINICAL DATA:  Initial treatment strategy for cervical carcinoma. EXAM: NUCLEAR MEDICINE PET SKULL BASE TO THIGH TECHNIQUE: 8.5 mCi F-18 FDG was injected intravenously. Full-ring PET imaging was performed from the skull base to thigh after the radiotracer. CT data was obtained and used for attenuation correction and anatomic localization. Fasting blood glucose: 109 mg/dl COMPARISON:  None Available. FINDINGS: Mediastinal blood-pool activity (background): SUV max = 1.3 Liver activity (reference): SUV max = N/A NECK:  No hypermetabolic lymph nodes or masses. Incidental CT findings:  None. CHEST: No hypermetabolic lymph nodes. No suspicious pulmonary nodules seen on CT images. Incidental CT findings:  None. ABDOMEN/PELVIS: Bulky soft tissue mass is seen involving the uterine cervix which measures 7.3 x 5.1 cm. This shows marked hypermetabolism, with SUV max of 24.0. No hypermetabolic lymph nodes in the pelvis or abdomen. No abnormal hypermetabolic activity within the liver, pancreas, adrenal glands, or spleen. Incidental CT findings:  None. SKELETON: No focal hypermetabolic bone lesions to suggest skeletal metastasis. Incidental CT findings:  None. IMPRESSION: Bulky hypermetabolic soft tissue mass in the uterine cervix, consistent with known cervical carcinoma.  No evidence of local or distant metastatic disease. Electronically Signed   By: Marlaine Hind M.D.   On: 12/02/2021 14:59   US OB Limited  Result Date: 11/11/2021 CLINICAL DATA:  Vaginal discharge and third trimester. EXAM: OBSTETRICAL ULTRASOUND >14 WKS FINDINGS: Number of Fetuses: 1 Heart Rate:  155 bpm Movement: Yes Presentation: Cephalic Previa: No Placental Location: Fundal Amniotic Fluid (Subjective): Normal Amniotic Fluid (Objective): Vertical pocket 8.4cm AFI 16.9 cm FETAL BIOMETRY BPD:  9.4cm 38w 1d Current Mean GA: 38w 1d [place] EDC: 11/24/2021 Assigned GA: 35w 1d Assigned EDC: 12/15/2021 Maternal Findings: Cervix:  Closed measuring up to 6 cm IMPRESSION: 1. Single live intrauterine gestation in cephalic presentation with approximate gestational age of [redacted] weeks and 1 day, EDC based on today's sonogram is 11/24/2021. 2. Normal amniotic fluid index. Cervix is closed measuring up to 6 cm. Electronically Signed   By: Keane Police D.O.   On: 11/11/2021 17:06

## 2021-12-06 NOTE — Consult Note (Signed)
NEW PATIENT EVALUATION  Name: Kylie [name]  MRN: [id number]  Date:   12/06/2021     DOB: 1990/04/13   This 31 y.o. female patient presents to the clinic for initial evaluation of stage Ib 3 squamous of carcinoma of the cervix.  REFERRING PHYSICIAN: Mellody Drown, MD  CHIEF COMPLAINT: No chief complaint on file.   DIAGNOSIS: There were no encounter diagnoses.   PREVIOUS INVESTIGATIONS:  PET CT scan reviewed Pathology reports reviewed Clinical notes reviewed  HPI: Patient is a 31 year old female who originally presented at 35 weeks and 6 days pregnant who presented with possible placenta previa.  She was found on pelvic exam to have a cervical mass about 8 cm continuous with the cervix.  Biopsy was obtained showing invasive squamous cell carcinoma.  She underwent C-section after seeing Dr. Fransisca Connors.  She underwent a C-section with bilateral tubal sterilization by salpingectomy as well as bilateral ovarian transposition into the paraglottic gutters with hemoclips placed to avoid premature premenopausal symptoms.  She underwent PET CT scan showing bulky hypermetabolic soft tissue mass at the uterine cervix consistent with known cervical carcinoma.  No evidence of local or distant metastatic disease was noted.  She is seen today for consideration of concurrent chemoradiation.  She is overall doing well and is without complaint.  PLANNED TREATMENT REGIMEN: Concurrent chemoradiation  PAST MEDICAL HISTORY:  has a past medical history of Anxiety, Chlamydia, Depression, Headache(784.0), and UTI (lower urinary tract infection).    PAST SURGICAL HISTORY:  Past Surgical History:  Procedure Laterality Date   CESAREAN SECTION WITH BILATERAL TUBAL LIGATION Bilateral 11/17/2021   Procedure: CLASSICAL CESAREAN SECTION WITH BILATERAL TUBAL LIGATION BY SALPINGECTOMY AND BILATERAL OOPHEROPEXY;  Surgeon: Benjaman Kindler, MD;  Location: [facility];  Service: Obstetrics;  Laterality: Bilateral;    HAND SURGERY Left    ORIF ELBOW FRACTURE Right 12/02/2012   Procedure: OPEN REDUCTION INTERNAL FIXATION (ORIF) ELBOW/OLECRANON FRACTURE;  Surgeon: Schuyler Amor, MD;  Location: [facility];  Service: Orthopedics;  Laterality: Right;    FAMILY HISTORY: family history includes Cancer in her paternal aunt and paternal grandmother; Diabetes in her paternal grandfather; Hyperlipidemia in her father; Hypertension in her father; Kidney disease in her paternal grandmother.  SOCIAL HISTORY:  reports that she quit smoking about 2 years ago. Her smoking use included cigarettes. She has a 0.25 pack-year smoking history. She has never used smokeless tobacco. She reports that she does not currently use alcohol. She reports current drug use. Drug: Marijuana.  ALLERGIES: Cat hair extract  MEDICATIONS:  Current Outpatient Medications  Medication Sig Dispense Refill   acetaminophen (TYLENOL) 500 MG tablet Take 2 tablets (1,000 mg total) by mouth every 6 (six) hours. 30 tablet 0   diphenhydrAMINE (BENADRYL) 25 mg capsule Take by mouth. (Patient not taking: Reported on 11/16/2021)     ibuprofen (ADVIL) 600 MG tablet Take 1 tablet (600 mg total) by mouth every 6 (six) hours. 60 tablet 1   ondansetron (ZOFRAN-ODT) 4 MG disintegrating tablet Take 1 tablet (4 mg total) by mouth every 8 (eight) hours as needed for nausea or vomiting. 30 tablet 0   Prenatal Vit-Fe Fumarate-FA (PRENATAL MULTIVITAMIN) TABS tablet Take by mouth.     prenatal vitamin w/FE, FA (NATACHEW) 29-1 MG CHEW chewable tablet Chew 1 tablet by mouth daily at 12 noon.     promethazine (PHENERGAN) 12.5 MG tablet Take 12.5 mg by mouth every 6 (six) hours as needed for nausea or vomiting.     promethazine (PHENERGAN) 25 MG  tablet Take by mouth. (Patient not taking: Reported on 11/16/2021)     No current facility-administered medications for this encounter.    ECOG PERFORMANCE STATUS:  0 - Asymptomatic  REVIEW OF SYSTEMS: Patient denies any weight loss,  fatigue, weakness, fever, chills or night sweats. Patient denies any loss of vision, blurred vision. Patient denies any ringing  of the ears or hearing loss. No irregular heartbeat. Patient denies heart murmur or history of fainting. Patient denies any chest pain or pain radiating to her upper extremities. Patient denies any shortness of breath, difficulty breathing at night, cough or hemoptysis. Patient denies any swelling in the lower legs. Patient denies any nausea vomiting, vomiting of blood, or coffee ground material in the vomitus. Patient denies any stomach pain. Patient states has had normal bowel movements no significant constipation or diarrhea. Patient denies any dysuria, hematuria or significant nocturia. Patient denies any problems walking, swelling in the joints or loss of balance. Patient denies any skin changes, loss of hair or loss of weight. Patient denies any excessive worrying or anxiety or significant depression. Patient denies any problems with insomnia. Patient denies excessive thirst, polyuria, polydipsia. Patient denies any swollen glands, patient denies easy bruising or easy bleeding. Patient denies any recent infections, allergies or URI. Patient "s visual fields have not changed significantly in recent time.   PHYSICAL EXAM: There were no vitals taken for this visit. Well-developed well-nourished patient in NAD. HEENT reveals PERLA, EOMI, discs not visualized.  Oral cavity is clear. No oral mucosal lesions are identified. Neck is clear without evidence of cervical or supraclavicular adenopathy. Lungs are clear to A&P. Cardiac examination is essentially unremarkable with regular rate and rhythm without murmur rub or thrill. Abdomen is benign with no organomegaly or masses noted. Motor sensory and DTR levels are equal and symmetric in the upper and lower extremities. Cranial nerves II through XII are grossly intact. Proprioception is intact. No peripheral adenopathy or edema is  identified. No motor or sensory levels are noted. Crude visual fields are within normal range.  LABORATORY DATA: Pathology reports reviewed    RADIOLOGY RESULTS: PET CT scan reviewed compatible with above-stated findings   IMPRESSION: Stage Ib 3 squamous cell carcinoma of the cervix bulky disease and patient recently status post a C-section and 31 year old female  PLAN: At this time I have recommended whole pelvic radiation therapy along with concurrent chemotherapy.  Would use IMRT treatment planning and delivery.  We will treat her cervix to 50 Gray in remaining lymph nodes to 45 Gray using IMRT dose painting technique.  Risks and benefits of treatment including increased lower urinary tract symptoms possible diarrhea fatigue alteration of blood count skin reaction all were reviewed with the patient.  She seems to comprehend my treatment plan well.  There will be extra effort by both professional staff as well as technical staff to coordinate and manage concurrent chemoradiation and ensuing side effects during her treatments.  Patient also will need referral to Dr. Christel Mormon at [facility] for brachytherapy after completion of whole pelvic radiation.  I would like to take this opportunity to thank you for allowing me to participate in the care of your patient.Noreene Filbert, MD

## 2021-12-07 ENCOUNTER — Inpatient Hospital Stay: Payer: Medicaid Other

## 2021-12-07 ENCOUNTER — Other Ambulatory Visit: Payer: Self-pay

## 2021-12-07 NOTE — Progress Notes (Signed)
Tumor Board Documentation  Kylie [name] was presented by Mariea Clonts, nurse navigator, at our Tumor Board on 12/07/2021, which included representatives from medical oncology, surgical oncology, navigation, pathology, research.  Kylie [name] currently presents as a current patient with history of the following treatments: none.  Additionally, we reviewed previous medical and familial history, history of present illness, and recent lab results along with all available histopathologic and imaging studies. The tumor board considered available treatment options and made the following recommendations: Chemotherapy, Radiation therapy (primary modality) Follow up on Keynote A18 late October when released  The following procedures/referrals were also placed: No orders of the defined types were placed in this encounter.   Clinical Trial Status: not discussed   Staging used: Clinical Stage AJCC Staging:       Group: IB3   National site-specific guidelines   were discussed with respect to the case.  Tumor board is a meeting of clinicians from various specialty areas who evaluate and discuss patients for whom a multidisciplinary approach is being considered. Final determinations in the plan of care are those of the provider(s). The responsibility for follow up of recommendations given during tumor board is that of the provider.   Today's extended care, comprehensive team conference, Kylie [name] was not present for the discussion and was not examined.   Multidisciplinary Tumor Board is a multidisciplinary case peer review process.  Decisions discussed in the Multidisciplinary Tumor Board reflect the opinions of the specialists present at the conference without having examined the patient.  Ultimately, treatment and diagnostic decisions rest with the primary provider(s) and the patient.

## 2021-12-12 ENCOUNTER — Inpatient Hospital Stay: Payer: Medicaid Other

## 2021-12-13 ENCOUNTER — Inpatient Hospital Stay: Payer: Medicaid Other

## 2021-12-15 ENCOUNTER — Ambulatory Visit
Admission: RE | Admit: 2021-12-15 | Discharge: 2021-12-15 | Disposition: A | Discharge status: Home / Self Care | Payer: Medicaid Other | Source: Ambulatory Visit | Attending: Radiation Oncology | Admitting: Radiation Oncology

## 2021-12-15 ENCOUNTER — Other Ambulatory Visit: Payer: Self-pay

## 2021-12-15 DIAGNOSIS — C539 Malignant neoplasm of cervix uteri, unspecified: Secondary | ICD-10-CM | POA: Diagnosis not present

## 2021-12-19 DIAGNOSIS — C539 Malignant neoplasm of cervix uteri, unspecified: Secondary | ICD-10-CM | POA: Diagnosis not present

## 2021-12-21 ENCOUNTER — Other Ambulatory Visit: Payer: Self-pay | Admitting: *Deleted

## 2021-12-21 DIAGNOSIS — C539 Malignant neoplasm of cervix uteri, unspecified: Secondary | ICD-10-CM

## 2021-12-22 ENCOUNTER — Encounter: Payer: Self-pay | Admitting: Internal Medicine

## 2021-12-22 ENCOUNTER — Inpatient Hospital Stay: Payer: Medicaid Other

## 2021-12-22 ENCOUNTER — Ambulatory Visit: Admission: RE | Admit: 2021-12-22 | Payer: Medicaid Other | Source: Ambulatory Visit

## 2021-12-22 ENCOUNTER — Inpatient Hospital Stay (HOSPITAL_BASED_OUTPATIENT_CLINIC_OR_DEPARTMENT_OTHER): Payer: Medicaid Other | Admitting: Internal Medicine

## 2021-12-22 ENCOUNTER — Ambulatory Visit: Payer: Self-pay | Admitting: General Surgery

## 2021-12-22 VITALS — BP 114/74 | HR 82 | Temp 98.1°F | Resp 18 | Wt 193.6 lb

## 2021-12-22 DIAGNOSIS — Z87891 Personal history of nicotine dependence: Secondary | ICD-10-CM | POA: Insufficient documentation

## 2021-12-22 DIAGNOSIS — C539 Malignant neoplasm of cervix uteri, unspecified: Secondary | ICD-10-CM

## 2021-12-22 LAB — CBC WITH DIFFERENTIAL/PLATELET
Abs Immature Granulocytes: 0.03 10*3/uL (ref 0.00–0.07)
Basophils Absolute: 0.1 10*3/uL (ref 0.0–0.1)
Basophils Relative: 1 %
Eosinophils Absolute: 1.8 10*3/uL — ABNORMAL HIGH (ref 0.0–0.5)
Eosinophils Relative: 15 %
HCT: 36.2 % (ref 36.0–46.0)
Hemoglobin: 11.5 g/dL — ABNORMAL LOW (ref 12.0–15.0)
Immature Granulocytes: 0 %
Lymphocytes Relative: 26 %
Lymphs Abs: 3 10*3/uL (ref 0.7–4.0)
MCH: 27.6 pg (ref 26.0–34.0)
MCHC: 31.8 g/dL (ref 30.0–36.0)
MCV: 86.8 fL (ref 80.0–100.0)
Monocytes Absolute: 0.6 10*3/uL (ref 0.1–1.0)
Monocytes Relative: 5 %
Neutro Abs: 6.1 10*3/uL (ref 1.7–7.7)
Neutrophils Relative %: 53 %
Platelets: 339 10*3/uL (ref 150–400)
RBC: 4.17 MIL/uL (ref 3.87–5.11)
RDW: 13.3 % (ref 11.5–15.5)
WBC: 11.6 10*3/uL — ABNORMAL HIGH (ref 4.0–10.5)
nRBC: 0 % (ref 0.0–0.2)

## 2021-12-22 LAB — COMPREHENSIVE METABOLIC PANEL
ALT: 11 U/L (ref 0–44)
AST: 15 U/L (ref 15–41)
Albumin: 3.4 g/dL — ABNORMAL LOW (ref 3.5–5.0)
Alkaline Phosphatase: 95 U/L (ref 38–126)
Anion gap: 4 — ABNORMAL LOW (ref 5–15)
BUN: 9 mg/dL (ref 6–20)
CO2: 28 mmol/L (ref 22–32)
Calcium: 8.6 mg/dL — ABNORMAL LOW (ref 8.9–10.3)
Chloride: 106 mmol/L (ref 98–111)
Creatinine, Ser: 0.96 mg/dL (ref 0.44–1.00)
GFR, Estimated: >60 mL/min (ref >60)
Glucose, Bld: 89 mg/dL (ref 70–99)
Potassium: 3.8 mmol/L (ref 3.5–5.1)
Sodium: 138 mmol/L (ref 135–145)
Total Bilirubin: 0.5 mg/dL (ref 0.3–1.2)
Total Protein: 7.5 g/dL (ref 6.5–8.1)

## 2021-12-22 MED ORDER — LIDOCAINE-PRILOCAINE 2.5-2.5 % EX CREA: 1.0000 | TOPICAL_CREAM | CUTANEOUS | 3 refills | Status: AC | PRN

## 2021-12-22 NOTE — Progress Notes (Signed)
Patient here today for follow up regarding cervical cancer.

## 2021-12-22 NOTE — H&P (Signed)
PATIENT PROFILE: Kylie [name] is a 31 y.o. female who presents to the Clinic for consultation at the request of Dr. Darrall Dears for evaluation of insertion of Port-A-Cath.  PCP:  [facility]  HISTORY OF PRESENT ILLNESS: Ms. Borgwardt reports she was diagnosed with cervical cancer during pregnancy.  She had a C-section on 11/17/2021.  She has been evaluated by medical oncology and she was recommended to start chemotherapy.  Patient currently denies any bleeding.  Patient denies any chest pain.   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 [id number] at 71w5dbased on LMP of 03/10/21 and consistent with 10 week u/s.  Estimated Date of Delivery: 12/15/2021.  Sex of baby and name:  Baby Boy  Partner:   WIzell Snyder  Factors complicating this pregnancy:  Invasive squamous cell carcinoma, dx by biopsy at 35 wks - oncology visit pending - this pregnancy hx: ER visit for n/v in Jan 23, [facility] [place].   Elevated 1hr GTT 1hr GTT 180 3hr GCT: 90, 176, 152, 108 MFM appt completed 08/02/2021 No previa noted on anatomy [place] Obesity BMI: 31.62 Baseline labs Early 1 hour GTT: 122 P/C ratio: 100 CMP: wnl A1c: 5.2 Nicotine use in pregnancy  Vaping  THC use in pregnancy Using about every other day  UDS on admission to L&D  Growth [place] 4 weeks in 3rd trimester    Screening results and needs: NOB:  Medicaid Questionnaire: done 07/05/21 '[]'$  ACHD Program  Depression Score: 7 MBT: O Pos  Ab screen: Neg HIV: Neg  RPR: NR   Hep B: NR Hep C: NR Pap: will get done postpartum G/C:  Neg/Neg Rubella: Immune   VZV: Immune  TSH:0.838  HgA1C: 5.2 Aneuploidy:  First trimester:  MaternitT21: Negative, female  Second trimester (AFP/tetra): Negative 28 weeks:  Review Medicaid Questionnaire: '[]'$  ACHD Program Depression Score: 6 Blood consent: signed 09/15/21 Hgb: 11.4  Platelets: 295   Glucola: 180  Rhogam: n/a 36 weeks:  GBS:   G/C:   Hgb:  Platelets:    HIV: RPR:    Last [place]  09/01/21: Normal anatomy scan AFI=WNL FHR=138 bpm Presentation=transverse Placenta= left lateral Immunization:   Flu in season -  Tdap at 27-36 weeks - Given 09/15/21 TD Covid-19 -  Contraception Plan: BTL - consents signed 09/15/2021 and visualized AMM Feeding Plan: Formula  Labor Plans:            Placenta previa without hemorrhage in second trimester 07/05/2021   Overview    Resolved on anatomy [place]/11/2021 with MFM      [place]   Obesity in pregnancy, antepartum 07/05/2021   Marijuana use during pregnancy 05/30/2021   Overview    Formatting of this note might be different from the original. +THC; advised during OV to discontinue usage during pregnancy.      Drug use affecting pregnancy 05/30/2021   Overview    Formatting of this note might be different from the original. Formatting of this note might be different from the original. +THC; advised during OV  to discontinue usage during pregnancy.      History of gestational diabetes in prior pregnancy, currently pregnant 05/27/2021    GENERAL REVIEW OF SYSTEMS:   General ROS: negative for - chills, fatigue, fever, weight gain or weight loss Allergy and Immunology ROS: negative for - hives  Hematological and Lymphatic ROS: negative for - bleeding problems or bruising, negative for palpable nodes Endocrine ROS: negative for - heat or cold intolerance, hair changes Respiratory ROS: negative for - cough, shortness of breath or wheezing Cardiovascular ROS: no chest pain or palpitations GI ROS: negative for nausea,  vomiting, abdominal pain, diarrhea, constipation Musculoskeletal ROS: negative for - joint swelling or muscle pain Neurological ROS: negative for - confusion, syncope Dermatological ROS: negative for pruritus and rash Psychiatric: negative for anxiety, depression, difficulty sleeping and memory loss  MEDICATIONS: Current Outpatient Medications  Medication Sig Dispense Refill   acetaminophen (TYLENOL) 325 MG tablet Take by mouth     prenatal vit-iron fum-folic ac (PRENAVITE) tablet Take 1 tablet by mouth once daily     doxylamine-pyridoxine, vit B6, (DICLEGIS) 10-10 mg DR tablet 2 Tablets ay night.  Add 1 tablet on day 3 in the morning if symptoms continue.  If still symptomatic on day 4 add 1 tablet in the afternoon. (Patient not taking: Reported on 12/08/2021) 120 tablet 1   ondansetron (ZOFRAN) 8 MG tablet Take by mouth (Patient not taking: Reported on 12/08/2021)     ondansetron (ZOFRAN-ODT) 4 MG disintegrating tablet Take 4 mg by mouth every 8 (eight) hours as needed (Patient not taking: Reported on 07/05/2021)     prochlorperazine (COMPAZINE) 10 MG tablet Take 10 mg by mouth every 6 (six) hours as needed (Patient not taking: Reported on 12/08/2021)     promethazine (PHENERGAN) 25 MG tablet Take 25 mg by mouth every 8 (eight) hours as needed (Patient not taking: Reported on 12/08/2021)     No current facility-administered medications for this visit.    ALLERGIES: Allergenic extracts and Cat/feline products  PAST MEDICAL HISTORY: Past Medical History:  Diagnosis Date   Cervical cancer (CMS-HCC)    Depression     PAST SURGICAL HISTORY: Past Surgical History:  Procedure Laterality Date   CESAREAN SECTION       FAMILY HISTORY: Family History  Problem Relation Age of Onset   Diabetes Father    Diabetes Paternal Grandfather      SOCIAL HISTORY: Social History   Socioeconomic History   Marital status: Single  Tobacco Use   Smoking status: Some Days   Smokeless tobacco: Never   Substance and Sexual Activity   Alcohol use: Not Currently   Drug use: Not Currently    Types: Marijuana   Sexual activity: Not Currently    Partners: Male    Birth control/protection: None    PHYSICAL EXAM: Vitals:   12/22/21 1126  BP: 114/84  Pulse: 87   Body mass index is 32.27 kg/m. Weight: 85.3 kg (188 lb)   GENERAL: Alert, active, oriented x3  HEENT: Pupils equal reactive to light. Extraocular movements are intact. Sclera clear. Palpebral conjunctiva normal red color.Pharynx clear.  NECK: Supple with no palpable mass and no adenopathy.  LUNGS: Sound clear with no rales rhonchi or wheezes.  HEART: Regular rhythm S1 and S2 without murmur.  ABDOMEN: Soft and depressible, nontender with no palpable mass, no hepatomegaly.   EXTREMITIES: Well-developed well-nourished symmetrical with no dependent edema.  NEUROLOGICAL: Awake alert oriented, facial expression symmetrical, moving all extremities.  REVIEW OF DATA: I  have reviewed the following data today: Routine Prenatal on 11/08/2021  Component Date Value   Clue Cells, Vaginal 11/08/2021 None Seen    WBC (White Blood Cells),* 11/08/2021 Few (!)    Trichomonas, Vaginal 11/08/2021 None Seen    Bacteria, Vaginal 11/08/2021 Many (!)    Yeast, Vaginal 11/08/2021 None Seen   Ancillary Orders on 10/04/2021  Component Date Value   Glucose, Fasting 10/04/2021 90    Glucose Tolerance, 1 Hour 10/04/2021 176    Glucose Tolerance, 2 Hour 10/04/2021 152    Glucose Tolerance, 3 Hour 10/04/2021 108   Appointment on 09/28/2021  Component Date Value   WBC (White Blood Cell Co* 09/28/2021 14.8 (H)    RBC (Red Blood Cell Coun* 09/28/2021 3.68 (L)    Hemoglobin 09/28/2021 11.4 (L)    Hematocrit 09/28/2021 33.6 (L)    MCV (Mean Corpuscular Vo* 09/28/2021 91.3    MCH (Mean Corpuscular He* 09/28/2021 31.0    MCHC (Mean Corpuscular H* 09/28/2021 33.9    Platelet Count 09/28/2021 295    RDW-CV (Red Cell Distrib* 09/28/2021 13.1     MPV (Mean Platelet Volum* 09/28/2021 11.3    Neutrophils 09/28/2021 10.21 (H)    Lymphocytes 09/28/2021 2.80    Monocytes 09/28/2021 0.59    Eosinophils 09/28/2021 1.08 (H)    Basophils 09/28/2021 0.08    Neutrophil % 09/28/2021 69.0    Lymphocyte % 09/28/2021 18.9    Monocyte % 09/28/2021 4.0    Eosinophil % 09/28/2021 7.3 (H)    Basophil% 09/28/2021 0.5    Immature Granulocyte % 09/28/2021 0.3    Immature Granulocyte Cou* 09/28/2021 0.05    Glucose, OB/Post Dose 09/28/2021 180 (H)    Color 09/28/2021 Yellow    Clarity 09/28/2021 Clear    Specific Gravity 09/28/2021 1.025    pH, Urine 09/28/2021 6.0    Protein, Urinalysis 09/28/2021 Negative    Glucose, Urinalysis 09/28/2021 100 (!)    Ketones, Urinalysis 09/28/2021 Negative    Blood, Urinalysis 09/28/2021 Negative    Nitrite, Urinalysis 09/28/2021 Negative    Leukocyte Esterase, Urin* 09/28/2021 Negative    White Blood Cells, Urina* 09/28/2021 None Seen    Red Blood Cells, Urinaly* 09/28/2021 None Seen    Bacteria, Urinalysis 09/28/2021 Rare (!)    Squamous Epithelial Cell* 09/28/2021 Rare      ASSESSMENT: Ms. Lung is a 31 y.o. female presenting for consultation for Chemo-Port placement.  Patient with malignant neoplasm of the cervix.  She was recommended to start chemotherapy.  She already got chemo last at the cancer center.  She came today for discussion of Chemo-Port placement.  I discussed with patient the procedure and its risk.  I discussed with patient the risk of bleeding, infection, pneumothorax, hemothorax, arteriovenous fistula, pain, among others.  The patient reports she understood and agreed to proceed.  Malignant neoplasm of cervix, unspecified site (CMS-HCC) [C53.9]  PLAN: 1. Insertion of Port a Cath [phone], [id number], [id number]) 2. CBC, CMP done 3. Do not take aspirin 5 days before surgery 4. Contact us if has any question or concern.    Patient verbalized understanding, all questions were answered, and were  agreeable with the plan outlined above.     Herbert Pun, MD  Electronically signed by Herbert Pun, MD

## 2021-12-22 NOTE — H&P (View-Only) (Signed)
PATIENT PROFILE: Kylie [name] is a 31 y.o. female who presents to the Clinic for consultation at the request of Dr. Darrall Dears for evaluation of insertion of Port-A-Cath.  PCP:  [facility]  HISTORY OF PRESENT ILLNESS: Ms. Wuellner reports she was diagnosed with cervical cancer during pregnancy.  She had a C-section on 11/17/2021.  She has been evaluated by medical oncology and she was recommended to start chemotherapy.  Patient currently denies any bleeding.  Patient denies any chest pain.   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 [id number] at 26w5dbased on LMP of 03/10/21 and consistent with 10 week u/s.  Estimated Date of Delivery: 12/15/2021.  Sex of baby and name:  Baby Boy  Partner:   WIzell Staunton  Factors complicating this pregnancy:  Invasive squamous cell carcinoma, dx by biopsy at 35 wks - oncology visit pending - this pregnancy hx: ER visit for n/v in Jan 23[facility] [place].   Elevated 1hr GTT 1hr GTT 180 3hr GCT: 90, 176, 152, 108 MFM appt completed 08/02/2021 No previa noted on anatomy [place] Obesity BMI: 31.62 Baseline labs Early 1 hour GTT: 122 P/C ratio: 100 CMP: wnl A1c: 5.2 Nicotine use in pregnancy  Vaping  THC use in pregnancy Using about every other day  UDS on admission to L&D  Growth [place] 4 weeks in 3rd trimester    Screening results and needs: NOB:  Medicaid Questionnaire: done 07/05/21 '[]'$  ACHD Program  Depression Score: 7 MBT: O Pos  Ab screen: Neg HIV: Neg  RPR: NR   Hep B: NR Hep C: NR Pap: will get done postpartum G/C:  Neg/Neg Rubella: Immune   VZV: Immune  TSH:0.838  HgA1C: 5.2 Aneuploidy:  First trimester:  MaternitT21: Negative, female  Second trimester (AFP/tetra): Negative 28 weeks:  Review Medicaid Questionnaire: '[]'$  ACHD Program Depression Score: 6 Blood consent: signed 09/15/21 Hgb: 11.4  Platelets: 295   Glucola: 180  Rhogam: n/a 36 weeks:  GBS:   G/C:   Hgb:  Platelets:    HIV: RPR:    Last [place]  09/01/21: Normal anatomy scan AFI=WNL FHR=138 bpm Presentation=transverse Placenta= left lateral Immunization:   Flu in season -  Tdap at 27-36 weeks - Given 09/15/21 TD Covid-19 -  Contraception Plan: BTL - consents signed 09/15/2021 and visualized AMM Feeding Plan: Formula  Labor Plans:            Placenta previa without hemorrhage in second trimester 07/05/2021   Overview    Resolved on anatomy [place]/11/2021 with MFM      [place]   Obesity in pregnancy, antepartum 07/05/2021   Marijuana use during pregnancy 05/30/2021   Overview    Formatting of this note might be different from the original. +THC; advised during OV to discontinue usage during pregnancy.      Drug use affecting pregnancy 05/30/2021   Overview    Formatting of this note might be different from the original. Formatting of this note might be different from the original. +THC; advised during OV  to discontinue usage during pregnancy.      History of gestational diabetes in prior pregnancy, currently pregnant 05/27/2021    GENERAL REVIEW OF SYSTEMS:   General ROS: negative for - chills, fatigue, fever, weight gain or weight loss Allergy and Immunology ROS: negative for - hives  Hematological and Lymphatic ROS: negative for - bleeding problems or bruising, negative for palpable nodes Endocrine ROS: negative for - heat or cold intolerance, hair changes Respiratory ROS: negative for - cough, shortness of breath or wheezing Cardiovascular ROS: no chest pain or palpitations GI ROS: negative for nausea,  vomiting, abdominal pain, diarrhea, constipation Musculoskeletal ROS: negative for - joint swelling or muscle pain Neurological ROS: negative for - confusion, syncope Dermatological ROS: negative for pruritus and rash Psychiatric: negative for anxiety, depression, difficulty sleeping and memory loss  MEDICATIONS: Current Outpatient Medications  Medication Sig Dispense Refill   acetaminophen (TYLENOL) 325 MG tablet Take by mouth     prenatal vit-iron fum-folic ac (PRENAVITE) tablet Take 1 tablet by mouth once daily     doxylamine-pyridoxine, vit B6, (DICLEGIS) 10-10 mg DR tablet 2 Tablets ay night.  Add 1 tablet on day 3 in the morning if symptoms continue.  If still symptomatic on day 4 add 1 tablet in the afternoon. (Patient not taking: Reported on 12/08/2021) 120 tablet 1   ondansetron (ZOFRAN) 8 MG tablet Take by mouth (Patient not taking: Reported on 12/08/2021)     ondansetron (ZOFRAN-ODT) 4 MG disintegrating tablet Take 4 mg by mouth every 8 (eight) hours as needed (Patient not taking: Reported on 07/05/2021)     prochlorperazine (COMPAZINE) 10 MG tablet Take 10 mg by mouth every 6 (six) hours as needed (Patient not taking: Reported on 12/08/2021)     promethazine (PHENERGAN) 25 MG tablet Take 25 mg by mouth every 8 (eight) hours as needed (Patient not taking: Reported on 12/08/2021)     No current facility-administered medications for this visit.    ALLERGIES: Allergenic extracts and Cat/feline products  PAST MEDICAL HISTORY: Past Medical History:  Diagnosis Date   Cervical cancer (CMS-HCC)    Depression     PAST SURGICAL HISTORY: Past Surgical History:  Procedure Laterality Date   CESAREAN SECTION       FAMILY HISTORY: Family History  Problem Relation Age of Onset   Diabetes Father    Diabetes Paternal Grandfather      SOCIAL HISTORY: Social History   Socioeconomic History   Marital status: Single  Tobacco Use   Smoking status: Some Days   Smokeless tobacco: Never   Substance and Sexual Activity   Alcohol use: Not Currently   Drug use: Not Currently    Types: Marijuana   Sexual activity: Not Currently    Partners: Male    Birth control/protection: None    PHYSICAL EXAM: Vitals:   12/22/21 1126  BP: 114/84  Pulse: 87   Body mass index is 32.27 kg/m. Weight: 85.3 kg (188 lb)   GENERAL: Alert, active, oriented x3  HEENT: Pupils equal reactive to light. Extraocular movements are intact. Sclera clear. Palpebral conjunctiva normal red color.Pharynx clear.  NECK: Supple with no palpable mass and no adenopathy.  LUNGS: Sound clear with no rales rhonchi or wheezes.  HEART: Regular rhythm S1 and S2 without murmur.  ABDOMEN: Soft and depressible, nontender with no palpable mass, no hepatomegaly.   EXTREMITIES: Well-developed well-nourished symmetrical with no dependent edema.  NEUROLOGICAL: Awake alert oriented, facial expression symmetrical, moving all extremities.  REVIEW OF DATA: I  have reviewed the following data today: Routine Prenatal on 11/08/2021  Component Date Value   Clue Cells, Vaginal 11/08/2021 None Seen    WBC (White Blood Cells),* 11/08/2021 Few (!)    Trichomonas, Vaginal 11/08/2021 None Seen    Bacteria, Vaginal 11/08/2021 Many (!)    Yeast, Vaginal 11/08/2021 None Seen   Ancillary Orders on 10/04/2021  Component Date Value   Glucose, Fasting 10/04/2021 90    Glucose Tolerance, 1 Hour 10/04/2021 176    Glucose Tolerance, 2 Hour 10/04/2021 152    Glucose Tolerance, 3 Hour 10/04/2021 108   Appointment on 09/28/2021  Component Date Value   WBC (White Blood Cell Co* 09/28/2021 14.8 (H)    RBC (Red Blood Cell Coun* 09/28/2021 3.68 (L)    Hemoglobin 09/28/2021 11.4 (L)    Hematocrit 09/28/2021 33.6 (L)    MCV (Mean Corpuscular Vo* 09/28/2021 91.3    MCH (Mean Corpuscular He* 09/28/2021 31.0    MCHC (Mean Corpuscular H* 09/28/2021 33.9    Platelet Count 09/28/2021 295    RDW-CV (Red Cell Distrib* 09/28/2021 13.1     MPV (Mean Platelet Volum* 09/28/2021 11.3    Neutrophils 09/28/2021 10.21 (H)    Lymphocytes 09/28/2021 2.80    Monocytes 09/28/2021 0.59    Eosinophils 09/28/2021 1.08 (H)    Basophils 09/28/2021 0.08    Neutrophil % 09/28/2021 69.0    Lymphocyte % 09/28/2021 18.9    Monocyte % 09/28/2021 4.0    Eosinophil % 09/28/2021 7.3 (H)    Basophil% 09/28/2021 0.5    Immature Granulocyte % 09/28/2021 0.3    Immature Granulocyte Cou* 09/28/2021 0.05    Glucose, OB/Post Dose 09/28/2021 180 (H)    Color 09/28/2021 Yellow    Clarity 09/28/2021 Clear    Specific Gravity 09/28/2021 1.025    pH, Urine 09/28/2021 6.0    Protein, Urinalysis 09/28/2021 Negative    Glucose, Urinalysis 09/28/2021 100 (!)    Ketones, Urinalysis 09/28/2021 Negative    Blood, Urinalysis 09/28/2021 Negative    Nitrite, Urinalysis 09/28/2021 Negative    Leukocyte Esterase, Urin* 09/28/2021 Negative    White Blood Cells, Urina* 09/28/2021 None Seen    Red Blood Cells, Urinaly* 09/28/2021 None Seen    Bacteria, Urinalysis 09/28/2021 Rare (!)    Squamous Epithelial Cell* 09/28/2021 Rare      ASSESSMENT: Ms. Rozier is a 31 y.o. female presenting for consultation for Chemo-Port placement.  Patient with malignant neoplasm of the cervix.  She was recommended to start chemotherapy.  She already got chemo last at the cancer center.  She came today for discussion of Chemo-Port placement.  I discussed with patient the procedure and its risk.  I discussed with patient the risk of bleeding, infection, pneumothorax, hemothorax, arteriovenous fistula, pain, among others.  The patient reports she understood and agreed to proceed.  Malignant neoplasm of cervix, unspecified site (CMS-HCC) [C53.9]  PLAN: 1. Insertion of Port a Cath [phone], [id number], [id number]) 2. CBC, CMP done 3. Do not take aspirin 5 days before surgery 4. Contact us if has any question or concern.    Patient verbalized understanding, all questions were answered, and were  agreeable with the plan outlined above.     Herbert Pun, MD  Electronically signed by Herbert Pun, MD
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
## 2021-12-23 ENCOUNTER — Encounter
Admission: RE | Admit: 2021-12-23 | Discharge: 2021-12-23 | Disposition: A | Discharge status: Home / Self Care | Payer: Medicaid Other | Source: Ambulatory Visit | Attending: General Surgery | Admitting: General Surgery

## 2021-12-23 ENCOUNTER — Inpatient Hospital Stay: Payer: Medicaid Other

## 2021-12-23 VITALS — Ht 64.0 in | Wt 190.0 lb

## 2021-12-23 DIAGNOSIS — Z01812 Encounter for preprocedural laboratory examination: Secondary | ICD-10-CM

## 2021-12-23 NOTE — Patient Instructions (Addendum)
Your procedure is scheduled on: Monday December 26, 2021. Report to Day Surgery inside [facility] 2nd floor, stop by registration desk before getting on elevator.  To find out your arrival time please call [phone] between 1PM - 3PM on Friday December 23, 2021.  Remember: Instructions that are not followed completely may result in serious medical risk,  up to and including death, or upon the discretion of your surgeon and anesthesiologist your  surgery may need to be rescheduled.     _X__ 1. Do not eat food or drink fluids after midnight the night before your procedure.                 No chewing gum or hard candies.  __X__2.  On the morning of surgery brush your teeth with toothpaste and water, you                may rinse your mouth with mouthwash if you wish.  Do not swallow any toothpaste or mouthwash.     _X__ 3.  No Alcohol for 24 hours before or after surgery.   _X__ 4.  Do Not Smoke or use e-cigarettes For 24 Hours Prior to Your Surgery.                 Do not use any chewable tobacco products for at least 6 hours prior to                 Surgery.  _X__  5.  Do not use any recreational drugs (marijuana, cocaine, heroin, ecstasy, MDMA or other)                For at least one week prior to your surgery.  Combination of these drugs with anesthesia                May have life threatening results.  ____  6.  Bring all medications with you on the day of surgery if instructed.   __X__  7.  Notify your doctor if there is any change in your medical condition      (cold, fever, infections).     Do not wear jewelry, make-up, hairpins, clips or nail polish. Do not wear lotions, powders, or perfumes or deodorant. Do not shave 48 hours prior to surgery. Men may shave face and neck. Do not bring valuables to the hospital.    [facility] is not responsible for any belongings or valuables.  Contacts, dentures or bridgework may not be worn into  surgery. Leave your suitcase in the car. After surgery it may be brought to your room. For patients admitted to the hospital, discharge time is determined by your treatment team.   Patients discharged the day of surgery will not be allowed to drive home.   Make arrangements for someone to be with you for the first 24 hours of your Same Day Discharge.   __X__ Take these medicines the morning of surgery with A SIP OF WATER:    1. None   2.   3.   4.  5.  6.  ____ Fleet Enema (as directed)   __X__ Use CHG Soap (or wipes) as directed  ____ Use Benzoyl Peroxide Gel as instructed  ____ Use inhalers on the day of surgery  ____ Stop metformin 2 days prior to surgery    ____ Take 1/2 of usual insulin dose the night before surgery. No insulin the morning  of surgery.   ____ Call your PCP, cardiologist, or Pulmonologist if taking Coumadin/Plavix/aspirin and ask when to stop before your surgery.   __X__ One Week prior to surgery- Stop Anti-inflammatories such as Ibuprofen, Aleve, Advil, Motrin, meloxicam (MOBIC), diclofenac, etodolac, ketorolac, Toradol, Daypro, piroxicam, Goody's or BC powders. OK TO USE TYLENOL IF NEEDED   __X__ Do not start any new vitamins and or supplements until after surgery.    ____ Bring C-Pap to the hospital.    If you have any questions regarding your pre-procedure instructions,  Please call Pre-admit Testing at [phone]

## 2021-12-25 MED ORDER — CEFAZOLIN SODIUM-DEXTROSE 2-4 GM/100ML-% IV SOLN
2.0000 g | INTRAVENOUS | Status: AC
  Administered 2021-12-26: 2 g via INTRAVENOUS

## 2021-12-25 MED ORDER — ORAL CARE MOUTH RINSE: 15.0000 mL | Freq: Once | OROMUCOSAL | Status: AC

## 2021-12-25 MED ORDER — CHLORHEXIDINE GLUCONATE 0.12 % MT SOLN: 15.0000 mL | Freq: Once | OROMUCOSAL | Status: AC

## 2021-12-25 MED ORDER — LACTATED RINGERS IV SOLN: INTRAVENOUS | Status: DC

## 2021-12-25 MED ORDER — FAMOTIDINE 20 MG PO TABS: 20.0000 mg | ORAL_TABLET | Freq: Once | ORAL | Status: AC

## 2021-12-26 ENCOUNTER — Ambulatory Visit: Payer: Medicaid Other | Admitting: Certified Registered Nurse Anesthetist

## 2021-12-26 ENCOUNTER — Other Ambulatory Visit: Payer: Self-pay

## 2021-12-26 ENCOUNTER — Ambulatory Visit
Admission: RE | Admit: 2021-12-26 | Discharge: 2021-12-26 | Disposition: A | Discharge status: Home / Self Care | Payer: Medicaid Other | Source: Ambulatory Visit | Attending: Radiation Oncology | Admitting: Radiation Oncology

## 2021-12-26 ENCOUNTER — Ambulatory Visit
Admission: RE | Admit: 2021-12-26 | Discharge: 2021-12-26 | Disposition: A | Discharge status: Home / Self Care | Payer: Medicaid Other | Attending: General Surgery | Admitting: General Surgery

## 2021-12-26 ENCOUNTER — Encounter
Admission: RE | Disposition: A | Discharge status: Home / Self Care | Payer: Self-pay | Source: Home / Self Care | Attending: General Surgery

## 2021-12-26 ENCOUNTER — Ambulatory Visit: Payer: Medicaid Other

## 2021-12-26 ENCOUNTER — Encounter: Payer: Self-pay | Admitting: General Surgery

## 2021-12-26 DIAGNOSIS — F1290 Cannabis use, unspecified, uncomplicated: Secondary | ICD-10-CM | POA: Insufficient documentation

## 2021-12-26 DIAGNOSIS — R197 Diarrhea, unspecified: Secondary | ICD-10-CM | POA: Diagnosis not present

## 2021-12-26 DIAGNOSIS — Z87891 Personal history of nicotine dependence: Secondary | ICD-10-CM | POA: Insufficient documentation

## 2021-12-26 DIAGNOSIS — R112 Nausea with vomiting, unspecified: Secondary | ICD-10-CM | POA: Diagnosis not present

## 2021-12-26 DIAGNOSIS — C539 Malignant neoplasm of cervix uteri, unspecified: Secondary | ICD-10-CM | POA: Insufficient documentation

## 2021-12-26 DIAGNOSIS — G893 Neoplasm related pain (acute) (chronic): Secondary | ICD-10-CM | POA: Insufficient documentation

## 2021-12-26 DIAGNOSIS — H9312 Tinnitus, left ear: Secondary | ICD-10-CM | POA: Insufficient documentation

## 2021-12-26 DIAGNOSIS — Z5111 Encounter for antineoplastic chemotherapy: Secondary | ICD-10-CM | POA: Diagnosis present

## 2021-12-26 DIAGNOSIS — R001 Bradycardia, unspecified: Secondary | ICD-10-CM | POA: Insufficient documentation

## 2021-12-26 DIAGNOSIS — E876 Hypokalemia: Secondary | ICD-10-CM | POA: Diagnosis not present

## 2021-12-26 DIAGNOSIS — Z01812 Encounter for preprocedural laboratory examination: Secondary | ICD-10-CM

## 2021-12-26 DIAGNOSIS — Z79891 Long term (current) use of opiate analgesic: Secondary | ICD-10-CM | POA: Diagnosis not present

## 2021-12-26 DIAGNOSIS — Z510 Encounter for antineoplastic radiation therapy: Secondary | ICD-10-CM | POA: Insufficient documentation

## 2021-12-26 DIAGNOSIS — K5900 Constipation, unspecified: Secondary | ICD-10-CM | POA: Insufficient documentation

## 2021-12-26 HISTORY — PX: PORTACATH PLACEMENT: SHX2246

## 2021-12-26 LAB — RAD ONC ARIA SESSION SUMMARY
Course Elapsed Days: 0
Plan Fractions Treated to Date: 1
Plan Prescribed Dose Per Fraction: 2 Gy
Plan Total Fractions Prescribed: 25
Plan Total Prescribed Dose: 50 Gy
Reference Point Dosage Given to Date: 2 Gy
Reference Point Session Dosage Given: 2 Gy
Session Number: 1

## 2021-12-26 LAB — POCT PREGNANCY, URINE: Preg Test, Ur: NEGATIVE

## 2021-12-26 SURGERY — INSERTION, TUNNELED CENTRAL VENOUS DEVICE, WITH PORT
Anesthesia: General | Site: Chest

## 2021-12-26 MED ORDER — DEXAMETHASONE SODIUM PHOSPHATE 10 MG/ML IJ SOLN
INTRAMUSCULAR | Status: AC
  Filled 2021-12-26: qty 1

## 2021-12-26 MED ORDER — DEXMEDETOMIDINE HCL IN NACL 80 MCG/20ML IV SOLN
INTRAVENOUS | Status: DC | PRN
  Administered 2021-12-26: 4 ug via INTRAVENOUS
  Administered 2021-12-26 (×2): 8 ug via INTRAVENOUS

## 2021-12-26 MED ORDER — FENTANYL CITRATE (PF) 100 MCG/2ML IJ SOLN
INTRAMUSCULAR | Status: DC | PRN
  Administered 2021-12-26: 50 ug via INTRAVENOUS
  Administered 2021-12-26 (×2): 25 ug via INTRAVENOUS

## 2021-12-26 MED ORDER — HEPARIN SODIUM (PORCINE) 5000 UNIT/ML IJ SOLN
INTRAMUSCULAR | Status: AC
  Filled 2021-12-26: qty 1

## 2021-12-26 MED ORDER — MIDAZOLAM HCL 2 MG/2ML IJ SOLN
INTRAMUSCULAR | Status: DC | PRN
  Administered 2021-12-26: 2 mg via INTRAVENOUS

## 2021-12-26 MED ORDER — OXYCODONE HCL 5 MG PO TABS
ORAL_TABLET | ORAL | Status: AC
  Administered 2021-12-26: 5 mg via ORAL
  Filled 2021-12-26: qty 1

## 2021-12-26 MED ORDER — SODIUM CHLORIDE 0.9 % IV SOLN
INTRAVENOUS | Status: DC | PRN
  Administered 2021-12-26: 10 mL via INTRAMUSCULAR

## 2021-12-26 MED ORDER — PROPOFOL 10 MG/ML IV BOLUS
INTRAVENOUS | Status: DC | PRN
  Administered 2021-12-26: 30 mg via INTRAVENOUS
  Administered 2021-12-26: 20 mg via INTRAVENOUS
  Administered 2021-12-26: 30 mg via INTRAVENOUS

## 2021-12-26 MED ORDER — OXYCODONE HCL 5 MG PO TABS: 5.0000 mg | ORAL_TABLET | Freq: Once | ORAL | Status: AC | PRN

## 2021-12-26 MED ORDER — LIDOCAINE HCL (CARDIAC) PF 100 MG/5ML IV SOSY
PREFILLED_SYRINGE | INTRAVENOUS | Status: DC | PRN
  Administered 2021-12-26: 80 mg via INTRAVENOUS

## 2021-12-26 MED ORDER — BUPIVACAINE-EPINEPHRINE (PF) 0.25% -1:200000 IJ SOLN
INTRAMUSCULAR | Status: AC
  Filled 2021-12-26: qty 30

## 2021-12-26 MED ORDER — DEXMEDETOMIDINE HCL IN NACL 80 MCG/20ML IV SOLN: INTRAVENOUS | Status: DC | PRN

## 2021-12-26 MED ORDER — ONDANSETRON HCL 4 MG/2ML IJ SOLN
INTRAMUSCULAR | Status: DC | PRN
  Administered 2021-12-26: 4 mg via INTRAVENOUS

## 2021-12-26 MED ORDER — SODIUM CHLORIDE 0.9 % IR SOLN
Status: DC | PRN
  Administered 2021-12-26: 500 mL

## 2021-12-26 MED ORDER — PROPOFOL 10 MG/ML IV BOLUS
INTRAVENOUS | Status: AC
  Filled 2021-12-26: qty 20

## 2021-12-26 MED ORDER — CHLORHEXIDINE GLUCONATE 0.12 % MT SOLN
OROMUCOSAL | Status: AC
  Administered 2021-12-26: 15 mL via OROMUCOSAL
  Filled 2021-12-26: qty 15

## 2021-12-26 MED ORDER — CEFAZOLIN SODIUM-DEXTROSE 2-4 GM/100ML-% IV SOLN
INTRAVENOUS | Status: AC
  Filled 2021-12-26: qty 100

## 2021-12-26 MED ORDER — FENTANYL CITRATE (PF) 100 MCG/2ML IJ SOLN
25.0000 ug | INTRAMUSCULAR | Status: DC | PRN
  Administered 2021-12-26 (×3): 25 ug via INTRAVENOUS

## 2021-12-26 MED ORDER — GLYCOPYRROLATE 0.2 MG/ML IJ SOLN
INTRAMUSCULAR | Status: DC | PRN
  Administered 2021-12-26: .2 mg via INTRAVENOUS

## 2021-12-26 MED ORDER — PROPOFOL 10 MG/ML IV BOLUS
INTRAVENOUS | Status: AC
  Filled 2021-12-26: qty 40

## 2021-12-26 MED ORDER — FAMOTIDINE 20 MG PO TABS
ORAL_TABLET | ORAL | Status: AC
  Administered 2021-12-26: 20 mg via ORAL
  Filled 2021-12-26: qty 1

## 2021-12-26 MED ORDER — MIDAZOLAM HCL 2 MG/2ML IJ SOLN
INTRAMUSCULAR | Status: AC
  Filled 2021-12-26: qty 2

## 2021-12-26 MED ORDER — FENTANYL CITRATE (PF) 100 MCG/2ML IJ SOLN
INTRAMUSCULAR | Status: AC
  Administered 2021-12-26: 25 ug via INTRAVENOUS
  Filled 2021-12-26: qty 2

## 2021-12-26 MED ORDER — HYDROCODONE-ACETAMINOPHEN 5-325 MG PO TABS: 1.0000 | ORAL_TABLET | ORAL | 0 refills | Status: AC | PRN

## 2021-12-26 MED ORDER — OXYCODONE HCL 5 MG/5ML PO SOLN: 5.0000 mg | Freq: Once | ORAL | Status: AC | PRN

## 2021-12-26 MED ORDER — DEXAMETHASONE SODIUM PHOSPHATE 10 MG/ML IJ SOLN
INTRAMUSCULAR | Status: DC | PRN
  Administered 2021-12-26: 10 mg via INTRAVENOUS

## 2021-12-26 MED ORDER — FENTANYL CITRATE (PF) 100 MCG/2ML IJ SOLN
INTRAMUSCULAR | Status: AC
  Filled 2021-12-26: qty 2

## 2021-12-26 MED ORDER — LIDOCAINE HCL (PF) 2 % IJ SOLN
INTRAMUSCULAR | Status: AC
  Filled 2021-12-26: qty 5

## 2021-12-26 MED ORDER — PROPOFOL 500 MG/50ML IV EMUL
INTRAVENOUS | Status: DC | PRN
  Administered 2021-12-26: 125 ug/kg/min via INTRAVENOUS

## 2021-12-26 MED ORDER — ONDANSETRON HCL 4 MG/2ML IJ SOLN
INTRAMUSCULAR | Status: AC
  Filled 2021-12-26: qty 2

## 2021-12-26 MED ORDER — PHENYLEPHRINE 80 MCG/ML (10ML) SYRINGE FOR IV PUSH (FOR BLOOD PRESSURE SUPPORT)
PREFILLED_SYRINGE | INTRAVENOUS | Status: DC | PRN
  Administered 2021-12-26 (×2): 80 ug via INTRAVENOUS

## 2021-12-26 MED ORDER — BUPIVACAINE-EPINEPHRINE (PF) 0.25% -1:200000 IJ SOLN
INTRAMUSCULAR | Status: DC | PRN
  Administered 2021-12-26: 9 mL

## 2021-12-26 SURGICAL SUPPLY — 33 items
ADH SKN CLS APL DERMABOND .7 (GAUZE/BANDAGES/DRESSINGS) ×1
BAG DECANTER FOR FLEXI CONT (MISCELLANEOUS) ×1 IMPLANT
BLADE SURG 15 STRL LF DISP TIS (BLADE) ×1 IMPLANT
BLADE SURG 15 STRL SS (BLADE) ×1
BLADE SURG SZ11 CARB STEEL (BLADE) ×1 IMPLANT
BOOT SUTURE AID YELLOW STND (SUTURE) ×1 IMPLANT
DERMABOND ADVANCED .7 DNX12 (GAUZE/BANDAGES/DRESSINGS) ×1 IMPLANT
DRAPE C-ARM XRAY 36X54 (DRAPES) ×1 IMPLANT
ELECT REM PT RETURN 9FT ADLT (ELECTROSURGICAL) ×1
ELECTRODE REM PT RTRN 9FT ADLT (ELECTROSURGICAL) ×1 IMPLANT
GLOVE BIO SURGEON STRL SZ 6.5 (GLOVE) ×1 IMPLANT
GLOVE BIOGEL PI IND STRL 6.5 (GLOVE) ×1 IMPLANT
GOWN STRL REUS W/ TWL LRG LVL3 (GOWN DISPOSABLE) ×3 IMPLANT
GOWN STRL REUS W/TWL LRG LVL3 (GOWN DISPOSABLE) ×3
IV NS 500ML (IV SOLUTION) ×1
IV NS 500ML BAXH (IV SOLUTION) ×1 IMPLANT
KIT PORT POWER 8FR ISP CVUE (Port) ×1 IMPLANT
KIT TURNOVER KIT A (KITS) ×1 IMPLANT
LABEL OR SOLS (LABEL) ×1 IMPLANT
MANIFOLD NEPTUNE II (INSTRUMENTS) ×1 IMPLANT
NDL FILTER BLUNT 18X1 1/2 (NEEDLE) ×1 IMPLANT
NEEDLE FILTER BLUNT 18X1 1/2 (NEEDLE) ×1 IMPLANT
PACK PORT-A-CATH (MISCELLANEOUS) ×1 IMPLANT
SUT MNCRL AB 4-0 PS2 18 (SUTURE) ×1 IMPLANT
SUT PROLENE 2 0 FS (SUTURE) ×1 IMPLANT
SUT VIC AB 2-0 SH 27 (SUTURE) ×1
SUT VIC AB 2-0 SH 27XBRD (SUTURE) ×1 IMPLANT
SUT VIC AB 3-0 SH 27 (SUTURE) ×1
SUT VIC AB 3-0 SH 27X BRD (SUTURE) ×1 IMPLANT
SYR 10ML LL (SYRINGE) ×2 IMPLANT
SYR 3ML LL SCALE MARK (SYRINGE) ×1 IMPLANT
TRAP FLUID SMOKE EVACUATOR (MISCELLANEOUS) ×1 IMPLANT
WATER STERILE IRR 500ML POUR (IV SOLUTION) ×1 IMPLANT

## 2021-12-26 NOTE — Transfer of Care (Signed)
Immediate Anesthesia Transfer of Care Note  Patient: Kylie [name]  Procedure(s) Performed: INSERTION PORT-A-CATH (Chest)  Patient Location: PACU  Anesthesia Type:General  Level of Consciousness: drowsy  Airway & Oxygen Therapy: Patient Spontanous Breathing and Patient connected to face mask oxygen  Post-op Assessment: Report given to RN and Post -op Vital signs reviewed and stable  Post vital signs: Reviewed and stable  Last Vitals:  Vitals Value Taken Time  BP 92/45   Temp    Pulse 71 12/26/21 0824  Resp 12   SpO2 99 % 12/26/21 0824  Vitals shown include unvalidated device data.  Last Pain:  Vitals:   12/26/21 0639  TempSrc: Oral  PainSc: 0-No pain         Complications: No notable events documented.

## 2021-12-26 NOTE — Anesthesia Postprocedure Evaluation (Signed)
Anesthesia Post Note  Patient: Kylie [name]  Procedure(s) Performed: INSERTION PORT-A-CATH (Chest)  Patient location during evaluation: PACU Anesthesia Type: General Level of consciousness: awake and alert Pain management: pain level controlled Vital Signs Assessment: post-procedure vital signs reviewed and stable Respiratory status: spontaneous breathing, nonlabored ventilation, respiratory function stable and patient connected to nasal cannula oxygen Cardiovascular status: blood pressure returned to baseline and stable Postop Assessment: no apparent nausea or vomiting Anesthetic complications: no   No notable events documented.   Last Vitals:  Vitals:   12/26/21 0824 12/26/21 0830  BP: (!) 92/45 (!) 93/55  Pulse: 71 65  Resp: 13 11  Temp: 36.8 C   SpO2: 99% 100%    Last Pain:  Vitals:   12/26/21 0830  TempSrc:   PainSc: Mark

## 2021-12-26 NOTE — Op Note (Signed)
SURGICAL PROCEDURE REPORT  DATE OF PROCEDURE: 12/26/2021   SURGEON: Dr. Windell Moment   ANESTHESIA: Local with light IV sedation   PRE-OPERATIVE DIAGNOSIS: Advanced cervix cancer requiring durable central venous access for chemotherapy   POST-OPERATIVE DIAGNOSIS: Same  PROCEDURE(S): (cpt: 16109) 1.) Percutaneous access of Right internal jugular vein under ultrasound guidance 2.) Insertion of tunneled Right internal jugular central venous catheter with subcutaneous port  INTRAOPERATIVE FINDINGS: Patent easily compressible Right internal jugular vein with appropriate respiratory variations and well-secured tunneled central venous catheter with subcutaneous port at completion of the procedure  ESTIMATED BLOOD LOSS: Minimal (<20 mL)   SPECIMENS: None   IMPLANTS: 55F tunneled Bard PowerPort central venous catheter with subcutaneous port  DRAINS: None   COMPLICATIONS: None apparent   CONDITION AT COMPLETION: Hemodynamically stable, awake   DISPOSITION: PACU   INDICATION(S) FOR PROCEDURE:  Patient is a 31 y.o. female who presented with advanced cervic cancer requiring durable central venous access for chemotherapy. All risks, benefits, and alternatives to above elective procedures were discussed with the patient, who elected to proceed, and informed consent was accordingly obtained at that time.  DETAILS OF PROCEDURE:  Patient was brought to the operative suite and appropriately identified. In Trendelenburg position, Right IJ venous access site was prepped and draped in the usual sterile fashion, and following a brief timeout, percutaneous Right IJ venous access was obtained under ultrasound guidance using Seldinger technique, by which local anesthetic was injected over the Right IJ vein, and access needle was inserted under direct ultrasound visualization into the Right IJ vein, through which soft guidewire was advanced, over which access needle was withdrawn. Guidewire was secured,  attention was directed to injection of local anesthetic along the planned tunnel site, 2-3 cm transverse Right chest incision was made and confirmed to accommodate the subcutaneous port, and flushed catheter was tunneled retrograde from the port site over the Right chest to the Right/Left IJ access site with the attached port well-secured to the catheter and within the subcutaneous pocket. Insertion sheath was advanced over the guidewire, which was withdrawn along with the insertion sheath dilator. The catheter was introduced through the sheath and left on the [place]rio [place] junction under fluoro guidance and catheter cut to desire lenght. Catheter connected to port and fixed to the pocket on two side to avoid twisting. Port was confirmed to withdraw blood and flush easily, after which concentrated heparin was instilled into the port and catheter. Dermis at the subcutaneous pocket was re-approximated using buried interrupted 3-0 Vicryl suture, and 4-0 Monocryl suture was used to re-approximate skin at the insertion and subcutaneous port sites in running subcuticular fashion for the subcutaneous port and buried interrupted fashion for the insertion site. Skin was cleaned, dried, and sterile skin glue was applied. Patient was then safely transferred to PACU for a chest x-ray. Ultrasound images are available on paper chart and Fluoroscopy guidance images are available in Epic.

## 2021-12-26 NOTE — Anesthesia Procedure Notes (Signed)
Date/Time: 12/26/2021 7:43 AM  Performed by: Lily Peer, Adalynne Steffensmeier, CRNAPre-anesthesia Checklist: Patient identified, Emergency Drugs available, Suction available, Patient being monitored and Timeout performed Patient Re-evaluated:Patient Re-evaluated prior to induction Oxygen Delivery Method: Simple face mask Induction Type: IV induction
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
## 2021-12-26 NOTE — Discharge Instructions (Signed)

## 2021-12-26 NOTE — Interval H&P Note (Signed)
History and Physical Interval Note:  12/26/2021 6:59 AM  Kylie [name]  has presented today for surgery, with the diagnosis of C53.9 Malignant neoplasm of cervix.  The various methods of treatment have been discussed with the patient and family. After consideration of risks, benefits and other options for treatment, the patient has consented to  Procedure(s): INSERTION PORT-A-CATH (N/A) as a surgical intervention.  The patient's history has been reviewed, patient examined, no change in status, stable for surgery.  I have reviewed the patient's chart and labs.  Questions were answered to the patient's satisfaction.     Herbert Pun

## 2021-12-27 ENCOUNTER — Other Ambulatory Visit: Payer: Self-pay

## 2021-12-27 ENCOUNTER — Ambulatory Visit
Admission: RE | Admit: 2021-12-27 | Discharge: 2021-12-27 | Disposition: A | Discharge status: Home / Self Care | Payer: Medicaid Other | Source: Ambulatory Visit | Attending: Radiation Oncology | Admitting: Radiation Oncology

## 2021-12-27 DIAGNOSIS — Z5111 Encounter for antineoplastic chemotherapy: Secondary | ICD-10-CM | POA: Diagnosis not present

## 2021-12-27 LAB — RAD ONC ARIA SESSION SUMMARY
Course Elapsed Days: 1
Plan Fractions Treated to Date: 2
Plan Prescribed Dose Per Fraction: 2 Gy
Plan Total Fractions Prescribed: 25
Plan Total Prescribed Dose: 50 Gy
Reference Point Dosage Given to Date: 4 Gy
Reference Point Session Dosage Given: 2 Gy
Session Number: 2

## 2021-12-28 ENCOUNTER — Ambulatory Visit
Admission: RE | Admit: 2021-12-28 | Discharge: 2021-12-28 | Disposition: A | Discharge status: Home / Self Care | Payer: Medicaid Other | Source: Ambulatory Visit | Attending: Radiation Oncology | Admitting: Radiation Oncology

## 2021-12-28 ENCOUNTER — Other Ambulatory Visit: Payer: Self-pay

## 2021-12-28 DIAGNOSIS — Z5111 Encounter for antineoplastic chemotherapy: Secondary | ICD-10-CM | POA: Diagnosis not present

## 2021-12-28 LAB — RAD ONC ARIA SESSION SUMMARY
Course Elapsed Days: 2
Plan Fractions Treated to Date: 3
Plan Prescribed Dose Per Fraction: 2 Gy
Plan Total Fractions Prescribed: 25
Plan Total Prescribed Dose: 50 Gy
Reference Point Dosage Given to Date: 6 Gy
Reference Point Session Dosage Given: 2 Gy
Session Number: 3

## 2021-12-29 ENCOUNTER — Ambulatory Visit
Admission: RE | Admit: 2021-12-29 | Discharge: 2021-12-29 | Disposition: A | Discharge status: Home / Self Care | Payer: Medicaid Other | Source: Ambulatory Visit | Attending: Radiation Oncology | Admitting: Radiation Oncology

## 2021-12-29 ENCOUNTER — Other Ambulatory Visit: Payer: Self-pay

## 2021-12-29 DIAGNOSIS — Z5111 Encounter for antineoplastic chemotherapy: Secondary | ICD-10-CM | POA: Diagnosis not present

## 2021-12-29 LAB — RAD ONC ARIA SESSION SUMMARY
Course Elapsed Days: 3
Plan Fractions Treated to Date: 4
Plan Prescribed Dose Per Fraction: 2 Gy
Plan Total Fractions Prescribed: 25
Plan Total Prescribed Dose: 50 Gy
Reference Point Dosage Given to Date: 8 Gy
Reference Point Session Dosage Given: 2 Gy
Session Number: 4

## 2021-12-29 MED FILL — Fosaprepitant Dimeglumine For IV Infusion 150 MG (Base Eq): INTRAVENOUS | Qty: 5 | Status: AC

## 2021-12-29 MED FILL — Dexamethasone Sodium Phosphate Inj 100 MG/10ML: INTRAMUSCULAR | Qty: 1 | Status: AC

## 2021-12-30 ENCOUNTER — Other Ambulatory Visit: Payer: Medicaid Other

## 2021-12-30 ENCOUNTER — Inpatient Hospital Stay: Payer: Medicaid Other | Attending: Hematology and Oncology

## 2021-12-30 ENCOUNTER — Inpatient Hospital Stay: Payer: Medicaid Other

## 2021-12-30 ENCOUNTER — Other Ambulatory Visit: Payer: Self-pay

## 2021-12-30 ENCOUNTER — Ambulatory Visit
Admission: RE | Admit: 2021-12-30 | Discharge: 2021-12-30 | Disposition: A | Discharge status: Home / Self Care | Payer: Medicaid Other | Source: Ambulatory Visit | Attending: Radiation Oncology | Admitting: Radiation Oncology

## 2021-12-30 VITALS — BP 102/65 | HR 72 | Temp 98.7°F | Resp 20 | Wt 193.0 lb

## 2021-12-30 DIAGNOSIS — C539 Malignant neoplasm of cervix uteri, unspecified: Secondary | ICD-10-CM

## 2021-12-30 DIAGNOSIS — Z5111 Encounter for antineoplastic chemotherapy: Secondary | ICD-10-CM | POA: Diagnosis not present

## 2021-12-30 LAB — COMPREHENSIVE METABOLIC PANEL
ALT: 16 U/L (ref 0–44)
AST: 18 U/L (ref 15–41)
Albumin: 3.3 g/dL — ABNORMAL LOW (ref 3.5–5.0)
Alkaline Phosphatase: 89 U/L (ref 38–126)
Anion gap: 8 (ref 5–15)
BUN: 10 mg/dL (ref 6–20)
CO2: 26 mmol/L (ref 22–32)
Calcium: 8.9 mg/dL (ref 8.9–10.3)
Chloride: 103 mmol/L (ref 98–111)
Creatinine, Ser: 0.89 mg/dL (ref 0.44–1.00)
GFR, Estimated: >60 mL/min (ref >60)
Glucose, Bld: 116 mg/dL — ABNORMAL HIGH (ref 70–99)
Potassium: 3.5 mmol/L (ref 3.5–5.1)
Sodium: 137 mmol/L (ref 135–145)
Total Bilirubin: 0.8 mg/dL (ref 0.3–1.2)
Total Protein: 7 g/dL (ref 6.5–8.1)

## 2021-12-30 LAB — CBC WITH DIFFERENTIAL/PLATELET
Abs Immature Granulocytes: 0.06 10*3/uL (ref 0.00–0.07)
Basophils Absolute: 0.1 10*3/uL (ref 0.0–0.1)
Basophils Relative: 1 %
Eosinophils Absolute: 1.6 10*3/uL — ABNORMAL HIGH (ref 0.0–0.5)
Eosinophils Relative: 11 %
HCT: 34.9 % — ABNORMAL LOW (ref 36.0–46.0)
Hemoglobin: 11.4 g/dL — ABNORMAL LOW (ref 12.0–15.0)
Immature Granulocytes: 0 %
Lymphocytes Relative: 11 %
Lymphs Abs: 1.6 10*3/uL (ref 0.7–4.0)
MCH: 28.1 pg (ref 26.0–34.0)
MCHC: 32.7 g/dL (ref 30.0–36.0)
MCV: 86 fL (ref 80.0–100.0)
Monocytes Absolute: 0.7 10*3/uL (ref 0.1–1.0)
Monocytes Relative: 5 %
Neutro Abs: 10.9 10*3/uL — ABNORMAL HIGH (ref 1.7–7.7)
Neutrophils Relative %: 72 %
Platelets: 290 10*3/uL (ref 150–400)
RBC: 4.06 MIL/uL (ref 3.87–5.11)
RDW: 13.3 % (ref 11.5–15.5)
WBC: 14.9 10*3/uL — ABNORMAL HIGH (ref 4.0–10.5)
nRBC: 0 % (ref 0.0–0.2)

## 2021-12-30 LAB — RAD ONC ARIA SESSION SUMMARY
Course Elapsed Days: 4
Plan Fractions Treated to Date: 5
Plan Prescribed Dose Per Fraction: 2 Gy
Plan Total Fractions Prescribed: 25
Plan Total Prescribed Dose: 50 Gy
Reference Point Dosage Given to Date: 10 Gy
Reference Point Session Dosage Given: 2 Gy
Session Number: 5

## 2021-12-30 LAB — MAGNESIUM: Magnesium: 1.9 mg/dL (ref 1.7–2.4)

## 2021-12-30 MED ORDER — PALONOSETRON HCL INJECTION 0.25 MG/5ML
0.2500 mg | Freq: Once | INTRAVENOUS | Status: AC
  Administered 2021-12-30: 0.25 mg via INTRAVENOUS
  Filled 2021-12-30: qty 5

## 2021-12-30 MED ORDER — SODIUM CHLORIDE 0.9 % IV SOLN
40.0000 mg/m2 | Freq: Once | INTRAVENOUS | Status: AC
  Administered 2021-12-30: 78 mg via INTRAVENOUS
  Filled 2021-12-30: qty 78

## 2021-12-30 MED ORDER — MAGNESIUM SULFATE 2 GM/50ML IV SOLN
2.0000 g | Freq: Once | INTRAVENOUS | Status: AC
  Administered 2021-12-30: 2 g via INTRAVENOUS
  Filled 2021-12-30: qty 50

## 2021-12-30 MED ORDER — SODIUM CHLORIDE 0.9 % IV SOLN
10.0000 mg | Freq: Once | INTRAVENOUS | Status: AC
  Administered 2021-12-30: 10 mg via INTRAVENOUS
  Filled 2021-12-30: qty 10

## 2021-12-30 MED ORDER — SODIUM CHLORIDE 0.9 % IV SOLN
150.0000 mg | Freq: Once | INTRAVENOUS | Status: AC
  Administered 2021-12-30: 150 mg via INTRAVENOUS
  Filled 2021-12-30: qty 150

## 2021-12-30 MED ORDER — SODIUM CHLORIDE 0.9% FLUSH
10.0000 mL | INTRAVENOUS | Status: DC | PRN
  Administered 2021-12-30: 10 mL
  Filled 2021-12-30: qty 10

## 2021-12-30 MED ORDER — HEPARIN SOD (PORK) LOCK FLUSH 100 UNIT/ML IV SOLN
500.0000 [IU] | Freq: Once | INTRAVENOUS | Status: AC | PRN
  Administered 2021-12-30: 500 [IU]
  Filled 2021-12-30: qty 5

## 2021-12-30 MED ORDER — POTASSIUM CHLORIDE IN NACL 20-0.9 MEQ/L-% IV SOLN
Freq: Once | INTRAVENOUS | Status: AC
  Filled 2021-12-30: qty 1000

## 2021-12-30 MED ORDER — SODIUM CHLORIDE 0.9 % IV SOLN
Freq: Once | INTRAVENOUS | Status: AC
  Filled 2021-12-30: qty 250

## 2021-12-30 NOTE — Patient Instructions (Signed)
[facility]  Discharge Instructions: Thank you for choosing [facility] to provide your oncology and hematology care.  If you have a lab appointment with the Cancer Center, please go directly to the Cancer Center and check in at the registration area.  Wear comfortable clothing and clothing appropriate for easy access to any Portacath or PICC line.   We strive to give you quality time with your provider. You may need to reschedule your appointment if you arrive late (15 or more minutes).  Arriving late affects you and other patients whose appointments are after yours.  Also, if you miss three or more appointments without notifying the office, you may be dismissed from the clinic at the provider's discretion.      For prescription refill requests, have your pharmacy contact our office and allow 72 hours for refills to be completed.    Today you received the following chemotherapy and/or immunotherapy agents Cisplatin    To help prevent nausea and vomiting after your treatment, we encourage you to take your nausea medication as directed.  BELOW ARE SYMPTOMS THAT SHOULD BE REPORTED IMMEDIATELY: *FEVER GREATER THAN 100.4 F (38 C) OR HIGHER *CHILLS OR SWEATING *NAUSEA AND VOMITING THAT IS NOT CONTROLLED WITH YOUR NAUSEA MEDICATION *UNUSUAL SHORTNESS OF BREATH *UNUSUAL BRUISING OR BLEEDING *URINARY PROBLEMS (pain or burning when urinating, or frequent urination) *BOWEL PROBLEMS (unusual diarrhea, constipation, pain near the anus) TENDERNESS IN MOUTH AND THROAT WITH OR WITHOUT PRESENCE OF ULCERS (sore throat, sores in mouth, or a toothache) UNUSUAL RASH, SWELLING OR PAIN  UNUSUAL VAGINAL DISCHARGE OR ITCHING   Items with * indicate a potential emergency and should be followed up as soon as possible or go to the Emergency Department if any problems should occur.  Please show the CHEMOTHERAPY ALERT CARD or IMMUNOTHERAPY ALERT CARD at check-in to the  Emergency Department and triage nurse.  Should you have questions after your visit or need to cancel or reschedule your appointment, please contact [facility]  [phone] and follow the prompts.  Office hours are 8:00 a.m. to 4:30 p.m. Monday - Friday. Please note that voicemails left after 4:00 p.m. may not be returned until the following business day.  We are closed weekends and major holidays. You have access to a nurse at all times for urgent questions. Please call the main number to the clinic [phone] and follow the prompts.  For any non-urgent questions, you may also contact your provider using MyChart. We now offer e-Visits for anyone 18 and older to request care online for non-urgent symptoms. For details visit [email or web address].   Also download the MyChart app! Go to the app store, search "MyChart", open the app, select [facility], and log in with your MyChart username and password.  Masks are optional in the cancer centers. If you would like for your care team to wear a mask while they are taking care of you, please let them know. For doctor visits, patients may have with them one support person who is at least 33 years old. At this time, visitors are not allowed in the infusion area.   

## 2022-01-02 ENCOUNTER — Other Ambulatory Visit: Payer: Self-pay

## 2022-01-02 ENCOUNTER — Ambulatory Visit
Admission: RE | Admit: 2022-01-02 | Discharge: 2022-01-02 | Disposition: A | Discharge status: Home / Self Care | Payer: Medicaid Other | Source: Ambulatory Visit | Attending: Radiation Oncology | Admitting: Radiation Oncology

## 2022-01-02 ENCOUNTER — Telehealth: Payer: Self-pay

## 2022-01-02 DIAGNOSIS — Z5111 Encounter for antineoplastic chemotherapy: Secondary | ICD-10-CM | POA: Diagnosis not present

## 2022-01-02 LAB — RAD ONC ARIA SESSION SUMMARY
Course Elapsed Days: 7
Plan Fractions Treated to Date: 6
Plan Prescribed Dose Per Fraction: 2 Gy
Plan Total Fractions Prescribed: 25
Plan Total Prescribed Dose: 50 Gy
Reference Point Dosage Given to Date: 12 Gy
Reference Point Session Dosage Given: 2 Gy
Session Number: 6

## 2022-01-02 NOTE — Telephone Encounter (Signed)
Telephone call to patient for follow up after receiving first infusion.   No answer but left message stating we were calling to check on them.  Encouraged patient to call for any questions or concerns.   

## 2022-01-03 ENCOUNTER — Ambulatory Visit
Admission: RE | Admit: 2022-01-03 | Discharge: 2022-01-03 | Disposition: A | Discharge status: Home / Self Care | Payer: Medicaid Other | Source: Ambulatory Visit | Attending: Radiation Oncology | Admitting: Radiation Oncology

## 2022-01-03 ENCOUNTER — Other Ambulatory Visit: Payer: Self-pay

## 2022-01-03 DIAGNOSIS — Z5111 Encounter for antineoplastic chemotherapy: Secondary | ICD-10-CM | POA: Diagnosis not present

## 2022-01-03 LAB — RAD ONC ARIA SESSION SUMMARY
Course Elapsed Days: 8
Plan Fractions Treated to Date: 7
Plan Prescribed Dose Per Fraction: 2 Gy
Plan Total Fractions Prescribed: 25
Plan Total Prescribed Dose: 50 Gy
Reference Point Dosage Given to Date: 14 Gy
Reference Point Session Dosage Given: 2 Gy
Session Number: 7

## 2022-01-04 ENCOUNTER — Telehealth: Payer: Self-pay | Admitting: *Deleted

## 2022-01-04 ENCOUNTER — Other Ambulatory Visit: Payer: Self-pay

## 2022-01-04 ENCOUNTER — Ambulatory Visit
Admission: RE | Admit: 2022-01-04 | Discharge: 2022-01-04 | Disposition: A | Discharge status: Home / Self Care | Payer: Medicaid Other | Source: Ambulatory Visit | Attending: Radiation Oncology | Admitting: Radiation Oncology

## 2022-01-04 DIAGNOSIS — Z5111 Encounter for antineoplastic chemotherapy: Secondary | ICD-10-CM | POA: Diagnosis not present

## 2022-01-04 LAB — RAD ONC ARIA SESSION SUMMARY
Course Elapsed Days: 9
Plan Fractions Treated to Date: 8
Plan Prescribed Dose Per Fraction: 2 Gy
Plan Total Fractions Prescribed: 25
Plan Total Prescribed Dose: 50 Gy
Reference Point Dosage Given to Date: 16 Gy
Reference Point Session Dosage Given: 2 Gy
Session Number: 8

## 2022-01-04 NOTE — Telephone Encounter (Signed)
Patient called reporting that she is having progressively worsening pain in her back, bilateral hips and legs since getting her chemotherapy treatment. She saw her GYN today who told her that she needs to get in touch with our office. She states it is getting hard to move around and the pain is now interfering with her sleep. Please advise.  She understands that she may not be seen today due to the lateness of the time and is fine  waiting until tomorrow to be seen if needed or to get an answer

## 2022-01-05 ENCOUNTER — Telehealth: Payer: Self-pay

## 2022-01-05 ENCOUNTER — Encounter: Payer: Self-pay | Admitting: Internal Medicine

## 2022-01-05 ENCOUNTER — Inpatient Hospital Stay: Payer: Medicaid Other

## 2022-01-05 ENCOUNTER — Inpatient Hospital Stay (HOSPITAL_BASED_OUTPATIENT_CLINIC_OR_DEPARTMENT_OTHER): Payer: Medicaid Other | Admitting: Internal Medicine

## 2022-01-05 ENCOUNTER — Ambulatory Visit
Admission: RE | Admit: 2022-01-05 | Discharge: 2022-01-05 | Disposition: A | Discharge status: Home / Self Care | Payer: Medicaid Other | Source: Ambulatory Visit | Attending: Radiation Oncology | Admitting: Radiation Oncology

## 2022-01-05 ENCOUNTER — Other Ambulatory Visit: Payer: Self-pay

## 2022-01-05 VITALS — BP 107/77 | HR 82 | Temp 97.1°F | Resp 14 | Wt 188.0 lb

## 2022-01-05 DIAGNOSIS — C539 Malignant neoplasm of cervix uteri, unspecified: Secondary | ICD-10-CM | POA: Diagnosis not present

## 2022-01-05 DIAGNOSIS — Z5111 Encounter for antineoplastic chemotherapy: Secondary | ICD-10-CM | POA: Insufficient documentation

## 2022-01-05 LAB — RAD ONC ARIA SESSION SUMMARY
Course Elapsed Days: 10
Plan Fractions Treated to Date: 9
Plan Prescribed Dose Per Fraction: 2 Gy
Plan Total Fractions Prescribed: 25
Plan Total Prescribed Dose: 50 Gy
Reference Point Dosage Given to Date: 18 Gy
Reference Point Session Dosage Given: 2 Gy
Session Number: 9

## 2022-01-05 LAB — CBC WITH DIFFERENTIAL/PLATELET
Abs Immature Granulocytes: 0.08 10*3/uL — ABNORMAL HIGH (ref 0.00–0.07)
Basophils Absolute: 0.1 10*3/uL (ref 0.0–0.1)
Basophils Relative: 1 %
Eosinophils Absolute: 0.6 10*3/uL — ABNORMAL HIGH (ref 0.0–0.5)
Eosinophils Relative: 7 %
HCT: 33.9 % — ABNORMAL LOW (ref 36.0–46.0)
Hemoglobin: 11.1 g/dL — ABNORMAL LOW (ref 12.0–15.0)
Immature Granulocytes: 1 %
Lymphocytes Relative: 19 %
Lymphs Abs: 1.6 10*3/uL (ref 0.7–4.0)
MCH: 28.1 pg (ref 26.0–34.0)
MCHC: 32.7 g/dL (ref 30.0–36.0)
MCV: 85.8 fL (ref 80.0–100.0)
Monocytes Absolute: 0.6 10*3/uL (ref 0.1–1.0)
Monocytes Relative: 7 %
Neutro Abs: 5.5 10*3/uL (ref 1.7–7.7)
Neutrophils Relative %: 65 %
Platelets: 246 10*3/uL (ref 150–400)
RBC: 3.95 MIL/uL (ref 3.87–5.11)
RDW: 13.1 % (ref 11.5–15.5)
WBC: 8.4 10*3/uL (ref 4.0–10.5)
nRBC: 0 % (ref 0.0–0.2)

## 2022-01-05 LAB — BASIC METABOLIC PANEL
Anion gap: 8 (ref 5–15)
BUN: 11 mg/dL (ref 6–20)
CO2: 25 mmol/L (ref 22–32)
Calcium: 8.6 mg/dL — ABNORMAL LOW (ref 8.9–10.3)
Chloride: 103 mmol/L (ref 98–111)
Creatinine, Ser: 0.73 mg/dL (ref 0.44–1.00)
GFR, Estimated: >60 mL/min (ref >60)
Glucose, Bld: 89 mg/dL (ref 70–99)
Potassium: 4.3 mmol/L (ref 3.5–5.1)
Sodium: 136 mmol/L (ref 135–145)

## 2022-01-05 LAB — MAGNESIUM: Magnesium: 1.9 mg/dL (ref 1.7–2.4)

## 2022-01-05 MED ORDER — HEPARIN SOD (PORK) LOCK FLUSH 100 UNIT/ML IV SOLN
500.0000 [IU] | Freq: Once | INTRAVENOUS | Status: AC
  Administered 2022-01-05: 500 [IU] via INTRAVENOUS
  Filled 2022-01-05: qty 5

## 2022-01-05 MED ORDER — HYDROCODONE-ACETAMINOPHEN 5-325 MG PO TABS: 1.0000 | ORAL_TABLET | Freq: Two times a day (BID) | ORAL | 0 refills | Status: AC | PRN

## 2022-01-05 MED ORDER — SODIUM CHLORIDE 0.9% FLUSH
10.0000 mL | Freq: Once | INTRAVENOUS | Status: AC
  Administered 2022-01-05: 10 mL via INTRAVENOUS
  Filled 2022-01-05: qty 10

## 2022-01-05 MED FILL — Fosaprepitant Dimeglumine For IV Infusion 150 MG (Base Eq): INTRAVENOUS | Qty: 5 | Status: AC

## 2022-01-05 MED FILL — Dexamethasone Sodium Phosphate Inj 100 MG/10ML: INTRAMUSCULAR | Qty: 1 | Status: AC

## 2022-01-05 NOTE — Progress Notes (Signed)
Pt in for follow up, reports having pain in lower back that radiates to hips and legs.  Pt states has worsened over the past 5 days. States she notified OB/GYN and was instructed to speak with oncologist.

## 2022-01-05 NOTE — Progress Notes (Signed)
[facility] CONSULT NOTE  Patient Care Team: Pcp, No as PCP - General Linda Hedges, CNM as PCP - OBGYN (Certified Nurse Midwife) Clent Jacks, RN as Oncology Nurse Navigator  REFERRING PROVIDER: Dr. Fransisca Connors   REASON FOR REFFERAL: newly diagnosed cervical cancer   CANCER STAGING   Cancer Staging  Cervical cancer, FIGO stage IB ([place]) Staging form: Cervix Uteri, AJCC Version 9 - Clinical stage from 11/15/2021: FIGO Stage IB3 (cT1b3, cN0, cM0) - Signed by Jane Canary, MD on 12/06/2021 Histopathologic type: Squamous cell carcinoma, NOS Stage prefix: Initial diagnosis  Current treatment -Pelvis IMRT started 12/26/2021 25 fractions planned -Weekly cisplatin 40 mg/m2 x5 cycles started 12/30/2021  ASSESSMENT & PLAN:  Kylie [name] 31 y.o. 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  -Started IMRT with Dr. Donella Stade on 12/26/2021.  Weekly cisplatin cycle 1 40 mg/m2 started on 12/30/2021.  Overall tolerating well.  Counts reviewed and nonactionable.  We will proceed with cycle 2 cisplatin same dose tomorrow. -Did report pain in her back radiating down her both hips a day after cycle 1 of chemo.  Took 1 hydrocodone and muscle relaxant which helped.  Continues to have pain.  Sent a prescription for Norco 5-3 25 q12h prn #20 pills.  will continue to closely monitor pain. -Using antiemetics Zofran and Compazine as needed.  #Hypocalcemia, mild -Patient advised to take calcium supplements.  # Postpartum  - had C  section on 8/25. Follows with Dr. Leafy Ro.   # Access - port 12/26/2021 due to difficult peripheral access  RTC in 1 week for MD visit, labs, cycle 3 cisplatin decoupled  Orders Placed This Encounter  Procedures   CBC with Differential    Standing Status:   Future    Standing Expiration Date:   27/51/7001   Basic metabolic panel    Standing Status:   Future    Standing Expiration Date:   01/14/2023   Magnesium    Standing Status:   Future    Standing Expiration Date:   01/14/2023    The total time spent in the appointment was 25 minutes encounter with patients including review of chart and various tests results, discussions about plan of care and coordination of care plan   All questions were answered. The patient knows to call the clinic with any problems, questions or concerns. No barriers to learning was detected.  Jane Canary, MD 10/12/202311:15 AM   HISTORY OF PRESENTING ILLNESS:  Kylie [name] 31 y.o. 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  Patient seen today prior to cycle 2 of weekly cisplatin for toxicity check.  With cycle 1, the next day she started experiencing back pain which was then radiating to her bilateral hips.  She continues to have pain.  She took 1 hydrocodone and muscle relaxant from her friend which helped.  Had mild nausea over the weekend and took antiemetic.  Reports vaginal discharge is improving.  Denies any neuropathy, hearing changes, recent infections, fever  or chills.   I have reviewed her chart and materials related to her cancer extensively and collaborated history with the patient. Summary of oncologic history is as follows: Oncology History  Cervical cancer, FIGO stage IB ([place])  11/08/2021 Initial Diagnosis   Patient [id number] 33w pregnant admitted on 11/11/2021 by Dr. Leafy Ro for cervical mass, preterm contractions, vaginal bleeding and discharge.  Sample of  cervical mass were taken and sent to pathology.   11/15/2021 Cancer Staging   Staging form: Cervix Uteri, AJCC Version 9 - Clinical stage from 11/15/2021: FIGO Stage IB3 (cT1b3, cN0, cM0) - Signed by Jane Canary, MD on 12/06/2021 Histopathologic type: Squamous cell carcinoma, NOS Stage prefix: Initial diagnosis   11/15/2021 Pathology Results   DIAGNOSIS:  A.  CERVIX; BIOPSY:  - INVASIVE SQUAMOUS CELL CARCINOMA.  - SEE COMMENT.   Comment:  The biopsy fragments demonstrate moderately differentiated invasive squamous cell carcinoma.  Due to tangential sectioning the depth of invasion cannot be determined.    11/18/2021 Procedure   She delivered . S/p primary Classical Cesarean Section through Pfannenstiel incision; bilateral tubal sterilization by salpingectomy; bilateral ovarian transposition into the paracolic gutters with hemoclips placed on the medial borders of the ovaries to identify during radiation   12/30/2021 -  Chemotherapy   Patient is on Treatment Plan : CERVICAL Cisplatin (40) q7d + XRT        MEDICAL HISTORY:  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, doing better now   Headache(784.0)    UTI (lower urinary tract infection)     SURGICAL HISTORY: Past Surgical History:  Procedure Laterality Date   CESAREAN SECTION WITH BILATERAL TUBAL LIGATION Bilateral 11/17/2021   Procedure: CLASSICAL CESAREAN SECTION WITH BILATERAL TUBAL LIGATION BY SALPINGECTOMY AND BILATERAL OOPHEROPEXY;  Surgeon: Benjaman Kindler, MD;  Location: [facility];  Service: Obstetrics;  Laterality: Bilateral;   HAND SURGERY Left    ORIF ELBOW FRACTURE Right 12/02/2012   Procedure: OPEN REDUCTION INTERNAL FIXATION (ORIF) ELBOW/OLECRANON FRACTURE;  Surgeon: Schuyler Amor, MD;  Location: [facility];  Service: Orthopedics;  Laterality: Right;   PORTACATH PLACEMENT N/A 12/26/2021   Procedure: INSERTION PORT-A-CATH;  Surgeon: Herbert Pun, MD;   Location: [facility];  Service: General;  Laterality: N/A;    SOCIAL HISTORY: Social History   Socioeconomic History   Marital status: Significant Other    Spouse name: Not on file   Number of children: Not on file   Years of education: Not on file   Highest education level: Not on file  Occupational History   Not on file  Tobacco Use   Smoking status: Former    Packs/day: 0.25    Years: 1.00    Total pack years: 0.25    Types: Cigarettes    Quit date: 03/28/2019    Years since quitting: 2.7   Smokeless tobacco: Never   Tobacco comments:    electronic- e-sigs-mostly  Vaping Use   Vaping Use: Former   Substances: Nicotine, THC, Flavoring  Substance and Sexual Activity   Alcohol use: Not Currently    Comment: rarely   Drug use: Yes    Types: Marijuana    Comment: Delta 8 THC, Jul 25 2021   Sexual activity: Yes    Birth control/protection: None  Other Topics Concern   Not on file  Social History Narrative   Not on file   Social Determinants of Health   Financial Resource Strain:  Not on file  Food Insecurity: Not on file  Transportation Needs: Not on file  Physical Activity: Not on file  Stress: Not on file  Social Connections: Not on file  Intimate Partner Violence: Not on file    FAMILY HISTORY: Family History  Problem Relation Age of Onset   Hyperlipidemia Father    Hypertension Father    Cancer Paternal Grandmother    Kidney disease Paternal Grandmother    Diabetes Paternal Grandfather    Cancer Paternal Aunt    Hearing loss Neg Hx     ALLERGIES:  is allergic to cat hair extract.  MEDICATIONS:  Current Outpatient Medications  Medication Sig Dispense Refill   HYDROcodone-acetaminophen (NORCO) 5-325 MG tablet Take 1 tablet by mouth every 12 (twelve) hours as needed for up to 10 days for moderate pain or severe pain. 20 tablet 0   lidocaine-prilocaine (EMLA) cream Apply 1 Application topically as needed. Apply to port 1 hour prior to chemotherapy, cover  with plastic wrap 30 g 3   ondansetron (ZOFRAN) 8 MG tablet Take 1 tablet (8 mg total) by mouth every 8 (eight) hours as needed for nausea or vomiting. Take one tablet ('8mg'$  total) by mouth every 8hrs as needed. Start on the third day after cisplatin chemotherapy. 30 tablet 1   prochlorperazine (COMPAZINE) 10 MG tablet Take 1 tablet (10 mg total) by mouth every 6 (six) hours as needed for nausea or vomiting. 30 tablet 1   No current facility-administered medications for this visit.    REVIEW OF SYSTEMS:   Pertinent information mentioned in HPI All other systems were reviewed with the patient and are negative.  PHYSICAL EXAMINATION: ECOG PERFORMANCE STATUS: 0 - Asymptomatic  Vitals:   01/05/22 1055  BP: 107/77  Pulse: 82  Resp: 14  Temp: (!) 97.1 F (36.2 C)  SpO2: 99%    Filed Weights   01/05/22 1055  Weight: 188 lb (85.3 kg)     GENERAL:alert, no distress and comfortable SKIN: skin color, texture, turgor are normal, no rashes or significant lesions EYES: normal, conjunctiva are pink and non-injected, sclera clear OROPHARYNX:no exudate, no erythema and lips, buccal mucosa, and tongue normal  NECK: supple, thyroid normal size, non-tender, without nodularity LYMPH:  no palpable lymphadenopathy in the cervical, axillary or inguinal LUNGS: clear to auscultation and percussion with normal breathing effort HEART: regular rate & rhythm and no murmurs and no lower extremity edema ABDOMEN:abdomen soft, non-tender and normal bowel sounds Musculoskeletal:no cyanosis of digits and no clubbing  PSYCH: alert & oriented x 3 with fluent speech NEURO: no focal motor/sensory deficits  LABORATORY DATA:  I have reviewed the data as listed Lab Results  Component Value Date   WBC 8.4 01/05/2022   HGB 11.1 (L) 01/05/2022   HCT 33.9 (L) 01/05/2022   MCV 85.8 01/05/2022   PLT 246 01/05/2022   Recent Labs    11/17/21 1100 11/17/21 1900 12/22/21 1322 12/30/21 0846 01/05/22 1012  NA  135  --  138 137 136  K 3.8  --  3.8 3.5 4.3  CL 108  --  106 103 103  CO2 21*  --  '28 26 25  '$ GLUCOSE 89  --  89 116* 89  BUN <5*  --  '9 10 11  '$ CREATININE 0.65   < > 0.96 0.89 0.73  CALCIUM 8.9  --  8.6* 8.9 8.6*  GFRNONAA >60   < > >60 >60 >60  PROT 7.2  --  7.5 7.0  --  ALBUMIN 2.7*  --  3.4* 3.3*  --   AST 21  --  15 18  --   ALT 22  --  11 16  --   ALKPHOS 135*  --  95 89  --   BILITOT 0.7  --  0.5 0.8  --    < > = values in this interval not displayed.    RADIOGRAPHIC STUDIES: I have personally reviewed the radiological images as listed and agreed with the findings in the report. DG Chest Port 1 View  Result Date: 12/26/2021 CLINICAL DATA:  Port-A-Cath in place. EXAM: PORTABLE CHEST 1 VIEW COMPARISON:  September 18, 2020 FINDINGS: A Port-A-Cath is in good position. The distal tip terminates in the central SVC. No pneumothorax. The heart, hila, and mediastinum are unremarkable. No pulmonary nodules or masses. No suspicious infiltrates. Mild atelectasis in the bases. IMPRESSION: The right Port-A-Cath is in good position as above. No pneumothorax. No acute abnormalities. Mild atelectasis in the bases. Electronically Signed   By: Dorise Bullion III M.D.   On: 12/26/2021 09:04   DG C-Arm 1-60 Min-No Report  Result Date: 12/26/2021 Fluoroscopy was utilized by the requesting physician.  No radiographic interpretation.

## 2022-01-05 NOTE — Telephone Encounter (Signed)
Referral sent to Dr Christel Mormon to establish care for brachytherapy

## 2022-01-06 ENCOUNTER — Other Ambulatory Visit: Payer: Self-pay

## 2022-01-06 ENCOUNTER — Inpatient Hospital Stay: Payer: Medicaid Other

## 2022-01-06 ENCOUNTER — Ambulatory Visit
Admission: RE | Admit: 2022-01-06 | Discharge: 2022-01-06 | Disposition: A | Discharge status: Home / Self Care | Payer: Medicaid Other | Source: Ambulatory Visit | Attending: Radiation Oncology | Admitting: Radiation Oncology

## 2022-01-06 VITALS — BP 103/66 | HR 82 | Temp 97.6°F | Resp 16

## 2022-01-06 DIAGNOSIS — Z5111 Encounter for antineoplastic chemotherapy: Secondary | ICD-10-CM | POA: Diagnosis not present

## 2022-01-06 DIAGNOSIS — C539 Malignant neoplasm of cervix uteri, unspecified: Secondary | ICD-10-CM

## 2022-01-06 LAB — RAD ONC ARIA SESSION SUMMARY
Course Elapsed Days: 11
Plan Fractions Treated to Date: 10
Plan Prescribed Dose Per Fraction: 2 Gy
Plan Total Fractions Prescribed: 25
Plan Total Prescribed Dose: 50 Gy
Reference Point Dosage Given to Date: 20 Gy
Reference Point Session Dosage Given: 2 Gy
Session Number: 10

## 2022-01-06 MED ORDER — MAGNESIUM SULFATE 2 GM/50ML IV SOLN
2.0000 g | Freq: Once | INTRAVENOUS | Status: AC
  Administered 2022-01-06: 2 g via INTRAVENOUS
  Filled 2022-01-06: qty 50

## 2022-01-06 MED ORDER — SODIUM CHLORIDE 0.9 % IV SOLN
150.0000 mg | Freq: Once | INTRAVENOUS | Status: AC
  Administered 2022-01-06: 150 mg via INTRAVENOUS
  Filled 2022-01-06: qty 150

## 2022-01-06 MED ORDER — PALONOSETRON HCL INJECTION 0.25 MG/5ML
0.2500 mg | Freq: Once | INTRAVENOUS | Status: AC
  Administered 2022-01-06: 0.25 mg via INTRAVENOUS
  Filled 2022-01-06: qty 5

## 2022-01-06 MED ORDER — SODIUM CHLORIDE 0.9 % IV SOLN
10.0000 mg | Freq: Once | INTRAVENOUS | Status: AC
  Administered 2022-01-06: 10 mg via INTRAVENOUS
  Filled 2022-01-06: qty 10

## 2022-01-06 MED ORDER — HEPARIN SOD (PORK) LOCK FLUSH 100 UNIT/ML IV SOLN
500.0000 [IU] | Freq: Once | INTRAVENOUS | Status: AC | PRN
  Filled 2022-01-06: qty 5

## 2022-01-06 MED ORDER — POTASSIUM CHLORIDE IN NACL 20-0.9 MEQ/L-% IV SOLN
Freq: Once | INTRAVENOUS | Status: AC
  Filled 2022-01-06: qty 1000

## 2022-01-06 MED ORDER — HEPARIN SOD (PORK) LOCK FLUSH 100 UNIT/ML IV SOLN
INTRAVENOUS | Status: AC
  Administered 2022-01-06: 500 [IU]
  Filled 2022-01-06: qty 5

## 2022-01-06 MED ORDER — SODIUM CHLORIDE 0.9 % IV SOLN
Freq: Once | INTRAVENOUS | Status: AC
  Filled 2022-01-06: qty 250

## 2022-01-06 MED ORDER — SODIUM CHLORIDE 0.9 % IV SOLN
40.0000 mg/m2 | Freq: Once | INTRAVENOUS | Status: AC
  Administered 2022-01-06: 78 mg via INTRAVENOUS
  Filled 2022-01-06: qty 78

## 2022-01-06 NOTE — Patient Instructions (Signed)
[facility]  Discharge Instructions: Thank you for choosing [facility] to provide your oncology and hematology care.  If you have a lab appointment with the Cancer Center, please go directly to the Cancer Center and check in at the registration area.  Wear comfortable clothing and clothing appropriate for easy access to any Portacath or PICC line.   We strive to give you quality time with your provider. You may need to reschedule your appointment if you arrive late (15 or more minutes).  Arriving late affects you and other patients whose appointments are after yours.  Also, if you miss three or more appointments without notifying the office, you may be dismissed from the clinic at the provider's discretion.      For prescription refill requests, have your pharmacy contact our office and allow 72 hours for refills to be completed.    Today you received the following chemotherapy and/or immunotherapy agents Cisplatin    To help prevent nausea and vomiting after your treatment, we encourage you to take your nausea medication as directed.  BELOW ARE SYMPTOMS THAT SHOULD BE REPORTED IMMEDIATELY: *FEVER GREATER THAN 100.4 F (38 C) OR HIGHER *CHILLS OR SWEATING *NAUSEA AND VOMITING THAT IS NOT CONTROLLED WITH YOUR NAUSEA MEDICATION *UNUSUAL SHORTNESS OF BREATH *UNUSUAL BRUISING OR BLEEDING *URINARY PROBLEMS (pain or burning when urinating, or frequent urination) *BOWEL PROBLEMS (unusual diarrhea, constipation, pain near the anus) TENDERNESS IN MOUTH AND THROAT WITH OR WITHOUT PRESENCE OF ULCERS (sore throat, sores in mouth, or a toothache) UNUSUAL RASH, SWELLING OR PAIN  UNUSUAL VAGINAL DISCHARGE OR ITCHING   Items with * indicate a potential emergency and should be followed up as soon as possible or go to the Emergency Department if any problems should occur.  Please show the CHEMOTHERAPY ALERT CARD or IMMUNOTHERAPY ALERT CARD at check-in to the  Emergency Department and triage nurse.  Should you have questions after your visit or need to cancel or reschedule your appointment, please contact [facility]  [phone] and follow the prompts.  Office hours are 8:00 a.m. to 4:30 p.m. Monday - Friday. Please note that voicemails left after 4:00 p.m. may not be returned until the following business day.  We are closed weekends and major holidays. You have access to a nurse at all times for urgent questions. Please call the main number to the clinic [phone] and follow the prompts.  For any non-urgent questions, you may also contact your provider using MyChart. We now offer e-Visits for anyone 18 and older to request care online for non-urgent symptoms. For details visit [email or web address].   Also download the MyChart app! Go to the app store, search "MyChart", open the app, select [facility], and log in with your MyChart username and password.  Masks are optional in the cancer centers. If you would like for your care team to wear a mask while they are taking care of you, please let them know. For doctor visits, patients may have with them one support person who is at least 33 years old. At this time, visitors are not allowed in the infusion area.   

## 2022-01-09 ENCOUNTER — Telehealth: Payer: Self-pay | Admitting: *Deleted

## 2022-01-09 ENCOUNTER — Ambulatory Visit: Payer: Medicaid Other

## 2022-01-09 ENCOUNTER — Inpatient Hospital Stay (HOSPITAL_BASED_OUTPATIENT_CLINIC_OR_DEPARTMENT_OTHER): Payer: Medicaid Other | Admitting: Medical Oncology

## 2022-01-09 ENCOUNTER — Other Ambulatory Visit: Payer: Self-pay

## 2022-01-09 ENCOUNTER — Other Ambulatory Visit: Payer: Self-pay | Admitting: *Deleted

## 2022-01-09 ENCOUNTER — Inpatient Hospital Stay: Payer: Medicaid Other

## 2022-01-09 VITALS — BP 112/74 | HR 53 | Temp 96.8°F | Resp 16

## 2022-01-09 DIAGNOSIS — R197 Diarrhea, unspecified: Secondary | ICD-10-CM

## 2022-01-09 DIAGNOSIS — C539 Malignant neoplasm of cervix uteri, unspecified: Secondary | ICD-10-CM

## 2022-01-09 DIAGNOSIS — R001 Bradycardia, unspecified: Secondary | ICD-10-CM

## 2022-01-09 DIAGNOSIS — R112 Nausea with vomiting, unspecified: Secondary | ICD-10-CM

## 2022-01-09 DIAGNOSIS — Z5111 Encounter for antineoplastic chemotherapy: Secondary | ICD-10-CM | POA: Diagnosis not present

## 2022-01-09 DIAGNOSIS — Z95828 Presence of other vascular implants and grafts: Secondary | ICD-10-CM

## 2022-01-09 LAB — CBC WITH DIFFERENTIAL/PLATELET
Abs Immature Granulocytes: 0.01 10*3/uL (ref 0.00–0.07)
Basophils Absolute: 0 10*3/uL (ref 0.0–0.1)
Basophils Relative: 1 %
Eosinophils Absolute: 0.2 10*3/uL (ref 0.0–0.5)
Eosinophils Relative: 4 %
HCT: 32.5 % — ABNORMAL LOW (ref 36.0–46.0)
Hemoglobin: 10.8 g/dL — ABNORMAL LOW (ref 12.0–15.0)
Immature Granulocytes: 0 %
Lymphocytes Relative: 21 %
Lymphs Abs: 1.1 10*3/uL (ref 0.7–4.0)
MCH: 28.3 pg (ref 26.0–34.0)
MCHC: 33.2 g/dL (ref 30.0–36.0)
MCV: 85.3 fL (ref 80.0–100.0)
Monocytes Absolute: 0.4 10*3/uL (ref 0.1–1.0)
Monocytes Relative: 8 %
Neutro Abs: 3.4 10*3/uL (ref 1.7–7.7)
Neutrophils Relative %: 66 %
Platelets: 230 10*3/uL (ref 150–400)
RBC: 3.81 MIL/uL — ABNORMAL LOW (ref 3.87–5.11)
RDW: 13.2 % (ref 11.5–15.5)
WBC: 5.2 10*3/uL (ref 4.0–10.5)
nRBC: 0 % (ref 0.0–0.2)

## 2022-01-09 LAB — COMPREHENSIVE METABOLIC PANEL
ALT: 22 U/L (ref 0–44)
AST: 18 U/L (ref 15–41)
Albumin: 3.4 g/dL — ABNORMAL LOW (ref 3.5–5.0)
Alkaline Phosphatase: 70 U/L (ref 38–126)
Anion gap: 5 (ref 5–15)
BUN: 15 mg/dL (ref 6–20)
CO2: 25 mmol/L (ref 22–32)
Calcium: 8.4 mg/dL — ABNORMAL LOW (ref 8.9–10.3)
Chloride: 105 mmol/L (ref 98–111)
Creatinine, Ser: 0.88 mg/dL (ref 0.44–1.00)
GFR, Estimated: >60 mL/min (ref >60)
Glucose, Bld: 86 mg/dL (ref 70–99)
Potassium: 3.8 mmol/L (ref 3.5–5.1)
Sodium: 135 mmol/L (ref 135–145)
Total Bilirubin: 0.6 mg/dL (ref 0.3–1.2)
Total Protein: 6.9 g/dL (ref 6.5–8.1)

## 2022-01-09 LAB — MAGNESIUM: Magnesium: 1.8 mg/dL (ref 1.7–2.4)

## 2022-01-09 MED ORDER — SODIUM CHLORIDE 0.9% FLUSH
10.0000 mL | Freq: Once | INTRAVENOUS | Status: AC
  Administered 2022-01-09: 10 mL via INTRAVENOUS
  Filled 2022-01-09: qty 10

## 2022-01-09 MED ORDER — SODIUM CHLORIDE 0.9 % IV SOLN
INTRAVENOUS | Status: AC
  Filled 2022-01-09: qty 250

## 2022-01-09 MED ORDER — HEPARIN SOD (PORK) LOCK FLUSH 100 UNIT/ML IV SOLN
500.0000 [IU] | Freq: Once | INTRAVENOUS | Status: AC
  Administered 2022-01-09: 500 [IU] via INTRAVENOUS
  Filled 2022-01-09: qty 5

## 2022-01-09 NOTE — Telephone Encounter (Signed)
Patient contacted. She was given an apt for 1015 am today.

## 2022-01-09 NOTE — Progress Notes (Addendum)
Symptom Management [place] at [facility] Telephone:([phone]) [phone] Fax:(336) [phone]  Patient Care Team: Pcp, No as PCP - General Linda Hedges, CNM as PCP - OBGYN (Certified Nurse Midwife) Clent Jacks, RN as Oncology Nurse Navigator   Name of the patient: Kylie [name]  [id number]  12-25-1990   Date of visit: 01/09/22  Reason for Consult: Kylie [name] is a 31 y.o. 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 She reports that after her first cycle she did also have GI symptoms and mild headache but they were a bit more mild and only lasted a day. Patient reports that since last night she has been having some nausea and diarrhea along with mild headache. She has had one episode of non-bloody vomiting which was improved by her compazine. She estimates that she has had about 4-5 episodes of diarrhea in total- also non-bloody. She reports no abdominal pain, fevers, urinary symptoms. She has tried nothing yet for the diarrhea but did pick up some imodium to start when she gets home. Appetite is fair.   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, doing better now   Headache(784.0)    UTI (lower urinary tract infection)     PAST SURGICAL HISTORY:  Past Surgical History:  Procedure Laterality Date   CESAREAN SECTION WITH BILATERAL TUBAL LIGATION Bilateral 11/17/2021   Procedure: CLASSICAL CESAREAN SECTION WITH BILATERAL TUBAL LIGATION BY SALPINGECTOMY AND BILATERAL OOPHEROPEXY;  Surgeon: Benjaman Kindler, MD;  Location: [facility];  Service: Obstetrics;  Laterality: Bilateral;   HAND SURGERY Left     ORIF ELBOW FRACTURE Right 12/02/2012   Procedure: OPEN REDUCTION INTERNAL FIXATION (ORIF) ELBOW/OLECRANON FRACTURE;  Surgeon: Schuyler Amor, MD;  Location: [place];  Service: Orthopedics;  Laterality: Right;   PORTACATH PLACEMENT N/A 12/26/2021   Procedure: INSERTION PORT-A-CATH;  Surgeon: Herbert Pun, MD;  Location: [facility];  Service: General;  Laterality: N/A;    HEMATOLOGY/ONCOLOGY HISTORY:  Oncology History  Cervical cancer, FIGO stage IB ([place])  11/08/2021 Initial Diagnosis   Patient [id number] 35w pregnant admitted on 11/11/2021 by Dr. Leafy Ro for cervical mass, preterm contractions, vaginal bleeding and discharge.  Sample of cervical mass were taken and sent to pathology.   11/15/2021 Cancer Staging   Staging form: Cervix Uteri, AJCC Version 9 - Clinical stage from 11/15/2021: FIGO Stage IB3 (cT1b3, cN0, cM0) - Signed by Jane Canary, MD on 12/06/2021 Histopathologic type: Squamous cell carcinoma, NOS Stage prefix: Initial diagnosis   11/15/2021 Pathology Results   DIAGNOSIS:  A.  CERVIX; BIOPSY:  - INVASIVE SQUAMOUS CELL CARCINOMA.  - SEE COMMENT.   Comment:  The biopsy fragments demonstrate moderately differentiated invasive squamous cell carcinoma.  Due to tangential sectioning the depth of invasion cannot be determined.    11/18/2021 Procedure   She delivered . S/p primary Classical Cesarean Section through Pfannenstiel incision; bilateral tubal sterilization by salpingectomy; bilateral ovarian transposition into the paracolic gutters with hemoclips placed on the medial borders of the ovaries to identify during radiation   12/30/2021 -  Chemotherapy   Patient is on Treatment Plan : CERVICAL Cisplatin (  40) q7d + XRT        ALLERGIES:  is allergic to cat hair extract.  MEDICATIONS:  Current Outpatient Medications  Medication Sig Dispense Refill   HYDROcodone-acetaminophen (NORCO) 5-325 MG tablet Take 1 tablet by mouth every 12 (twelve) hours as needed for up to  10 days for moderate pain or severe pain. 20 tablet 0   lidocaine-prilocaine (EMLA) cream Apply 1 Application topically as needed. Apply to port 1 hour prior to chemotherapy, cover with plastic wrap 30 g 3   ondansetron (ZOFRAN) 8 MG tablet Take 1 tablet (8 mg total) by mouth every 8 (eight) hours as needed for nausea or vomiting. Take one tablet ('8mg'$  total) by mouth every 8hrs as needed. Start on the third day after cisplatin chemotherapy. 30 tablet 1   prochlorperazine (COMPAZINE) 10 MG tablet Take 1 tablet (10 mg total) by mouth every 6 (six) hours as needed for nausea or vomiting. 30 tablet 1   No current facility-administered medications for this visit.   Facility-Administered Medications Ordered in Other Visits  Medication Dose Route Frequency Provider Last Rate Last Admin   0.9 %  sodium chloride infusion   Intravenous Continuous Hughie Closs, [place] 999 mL/hr at 01/09/22 1100 New Bag at 01/09/22 1100    VITAL SIGNS: BP 112/74   Pulse (!) 53   Temp (!) 96.8 F (36 C) (Tympanic)   Resp 16   SpO2 100%  There were no vitals filed for this visit.  Estimated body mass index is 32.27 kg/m as calculated from the following:   Height as of 12/26/21: '5\' 4"'$  (1.626 m).   Weight as of 01/05/22: 188 lb (85.3 kg).  LABS: CBC:    Component Value Date/Time   WBC 5.2 01/09/2022 1038   HGB 10.8 (L) 01/09/2022 1038   HCT 32.5 (L) 01/09/2022 1038   PLT 230 01/09/2022 1038   MCV 85.3 01/09/2022 1038   NEUTROABS 3.4 01/09/2022 1038   LYMPHSABS 1.1 01/09/2022 1038   MONOABS 0.4 01/09/2022 1038   EOSABS 0.2 01/09/2022 1038   BASOSABS 0.0 01/09/2022 1038   Comprehensive Metabolic Panel:    Component Value Date/Time   NA 135 01/09/2022 1038   K 3.8 01/09/2022 1038   CL 105 01/09/2022 1038   CO2 25 01/09/2022 1038   BUN 15 01/09/2022 1038   CREATININE 0.88 01/09/2022 1038   GLUCOSE 86 01/09/2022 1038   GLUCOSE 88 07/24/2014 1052   CALCIUM 8.4 (L) 01/09/2022 1038   AST 18 01/09/2022  1038   ALT 22 01/09/2022 1038   ALKPHOS 70 01/09/2022 1038   BILITOT 0.6 01/09/2022 1038   PROT 6.9 01/09/2022 1038   ALBUMIN 3.4 (L) 01/09/2022 1038    RADIOGRAPHIC STUDIES: DG Chest Port 1 View  Result Date: 12/26/2021 CLINICAL DATA:  Port-A-Cath in place. EXAM: PORTABLE CHEST 1 VIEW COMPARISON:  September 18, 2020 FINDINGS: A Port-A-Cath is in good position. The distal tip terminates in the central SVC. No pneumothorax. The heart, hila, and mediastinum are unremarkable. No pulmonary nodules or masses. No suspicious infiltrates. Mild atelectasis in the bases. IMPRESSION: The right Port-A-Cath is in good position as above. No pneumothorax. No acute abnormalities. Mild atelectasis in the bases. Electronically Signed   By: Dorise Bullion III M.D.   On: 12/26/2021 09:04   DG C-Arm 1-60 Min-No Report  Result Date: 12/26/2021 Fluoroscopy was utilized by the requesting physician.  No radiographic interpretation.    PERFORMANCE STATUS (ECOG) : 0 - Asymptomatic  Review of  Systems Unless otherwise noted, a complete review of systems is negative.  Physical Exam General: NAD Cardiovascular: regular rate and rhythm Pulmonary: clear ant fields Abdomen: soft, nontender, + bowel sounds GU: no suprapubic tenderness Extremities: no edema, no joint deformities Skin: no rashes Neurological: Weakness but otherwise nonfocal  Assessment and Plan- Patient is a 31 y.o. female    Encounter Diagnoses  Name Primary?   Cervical cancer, FIGO stage IB (H[facility])    Diarrhea, unspecified type Yes   Nausea and vomiting, unspecified vomiting type    Bradycardia    Cervical cancer: S/p weekly Cisplatin cycle 2. No changes to plan at this time- cardiology referral placed. Keep previously scheduled visits.   Diarrhea/Nausea: New. Likely secondary to her weekly Cisplatin. Discussed bland diet, supportive measures, oral hydration, imodium. Compazine PRN as directed on the bottle. 1L IVF fluids given today. Labs  reviewed. We will plan a [facility] visit on next Monday for supportive care as well.   Bradycardia: New found on vitals. EKG shows sinus bradycardia. She reports scant lightheadedness yesterday but is feeling well today. I asked her to have a small amount of caffeine and stay hydrated with water. We discussed red flag signs and symptoms. Because her previous vital signs have been in the upper 80s and with Cisplatin potentially causing this new change, I have referred her to cardiology. She is agreeable.   Disposition: Today 1L IVF Keep previously scheduled visits Urgent cardiology referral ADD RTC [facility] Monday APP, labs (CBC w/, CMP, mag) +- 1L IVF  -[place]   Patient expressed understanding and was in agreement with this plan. She also understands that She can call clinic at any time with any questions, concerns, or complaints.   Thank you for allowing me to participate in the care of this very pleasant patient.   Time Total: 25  Visit consisted of counseling and education dealing with the complex and emotionally intense issues of symptom management in the setting of serious illness.Greater than 50%  of this time was spent counseling and coordinating care related to the above assessment and plan.  Signed by: Nelwyn Salisbury, PA-C

## 2022-01-09 NOTE — Telephone Encounter (Signed)
Spoke with patient. Pt has had multiple loose stools in the last 24 hours. She has not tried any imodium. She just sent her significant other to go and pick some imodium ad up at pharmacy. She needs to arrange for child care for her newborn and then she can come to the cancer center around 1015 am.

## 2022-01-09 NOTE — Addendum Note (Signed)
Addended by: Nelwyn Salisbury on: 01/09/2022 12:15 PM   Modules accepted: Orders

## 2022-01-09 NOTE — Telephone Encounter (Signed)
Patient called reporting that she is not coming in for er radiation therapy treatment today as she is not feeling well. She complains of nausea, diarrhea and headache, Denies nasal congestion and states it is mostly just my stomach

## 2022-01-10 ENCOUNTER — Ambulatory Visit
Admission: RE | Admit: 2022-01-10 | Discharge: 2022-01-10 | Disposition: A | Discharge status: Home / Self Care | Payer: Medicaid Other | Source: Ambulatory Visit | Attending: Radiation Oncology | Admitting: Radiation Oncology

## 2022-01-10 ENCOUNTER — Other Ambulatory Visit: Payer: Self-pay

## 2022-01-10 DIAGNOSIS — Z5111 Encounter for antineoplastic chemotherapy: Secondary | ICD-10-CM | POA: Diagnosis not present

## 2022-01-10 LAB — RAD ONC ARIA SESSION SUMMARY
Course Elapsed Days: 15
Plan Fractions Treated to Date: 11
Plan Prescribed Dose Per Fraction: 2 Gy
Plan Total Fractions Prescribed: 25
Plan Total Prescribed Dose: 50 Gy
Reference Point Dosage Given to Date: 22 Gy
Reference Point Session Dosage Given: 2 Gy
Session Number: 11

## 2022-01-11 ENCOUNTER — Ambulatory Visit
Admission: RE | Admit: 2022-01-11 | Discharge: 2022-01-11 | Disposition: A | Discharge status: Home / Self Care | Payer: Medicaid Other | Source: Ambulatory Visit | Attending: Radiation Oncology | Admitting: Radiation Oncology

## 2022-01-11 ENCOUNTER — Other Ambulatory Visit: Payer: Self-pay

## 2022-01-11 DIAGNOSIS — Z5111 Encounter for antineoplastic chemotherapy: Secondary | ICD-10-CM | POA: Diagnosis not present

## 2022-01-11 LAB — RAD ONC ARIA SESSION SUMMARY
Course Elapsed Days: 16
Plan Fractions Treated to Date: 12
Plan Prescribed Dose Per Fraction: 2 Gy
Plan Total Fractions Prescribed: 25
Plan Total Prescribed Dose: 50 Gy
Reference Point Dosage Given to Date: 24 Gy
Reference Point Session Dosage Given: 2 Gy
Session Number: 12

## 2022-01-12 ENCOUNTER — Other Ambulatory Visit: Payer: Self-pay

## 2022-01-12 ENCOUNTER — Ambulatory Visit
Admission: RE | Admit: 2022-01-12 | Discharge: 2022-01-12 | Disposition: A | Discharge status: Home / Self Care | Payer: Medicaid Other | Source: Ambulatory Visit | Attending: Radiation Oncology | Admitting: Radiation Oncology

## 2022-01-12 ENCOUNTER — Encounter: Payer: Self-pay | Admitting: Cardiology

## 2022-01-12 ENCOUNTER — Ambulatory Visit: Payer: Medicaid Other | Attending: Cardiology | Admitting: Cardiology

## 2022-01-12 VITALS — BP 106/64 | HR 60 | Ht 64.0 in | Wt 188.2 lb

## 2022-01-12 DIAGNOSIS — R001 Bradycardia, unspecified: Secondary | ICD-10-CM | POA: Diagnosis not present

## 2022-01-12 DIAGNOSIS — R112 Nausea with vomiting, unspecified: Secondary | ICD-10-CM

## 2022-01-12 DIAGNOSIS — Z5111 Encounter for antineoplastic chemotherapy: Secondary | ICD-10-CM | POA: Diagnosis not present

## 2022-01-12 LAB — RAD ONC ARIA SESSION SUMMARY
Course Elapsed Days: 17
Plan Fractions Treated to Date: 13
Plan Prescribed Dose Per Fraction: 2 Gy
Plan Total Fractions Prescribed: 25
Plan Total Prescribed Dose: 50 Gy
Reference Point Dosage Given to Date: 26 Gy
Reference Point Session Dosage Given: 2 Gy
Session Number: 13

## 2022-01-12 NOTE — Progress Notes (Signed)
Cardiology Office Note:    Date:  01/12/2022   ID:  Kylie [name], DOB 19-Nov-1990, MRN [id number]  PCP:  Merryl Hacker No   [facility] Providers Cardiologist:  None     Referring MD: Hughie Closs, PA-C   Chief Complaint  Patient presents with   New Patient (Initial Visit)    Brachycardia     History of Present Illness:    Kylie [name] is a 31 y.o. female with a hx of cervical cancer on chemotherapy who presents due to bradycardia.  Recently diagnosed with cervical cancer, undergoes chemo every Friday.  3 days ago she states feeling sick with nausea and vomiting.  Evaluated at the cancer center, pulse was low, EKG showed sinus bradycardia heart rate 47.  Advised to follow-up with cardiology.  Denies palpitations, denies dizziness, chest pain or shortness of breath.  Denies any history of heart disease.  Apparently cisplatin is one of her chemo agents.  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, doing better now   Headache(784.0)    UTI (lower urinary tract infection)     Past Surgical History:  Procedure Laterality Date   CESAREAN SECTION WITH BILATERAL TUBAL LIGATION Bilateral 11/17/2021   Procedure: CLASSICAL CESAREAN SECTION WITH BILATERAL TUBAL LIGATION BY SALPINGECTOMY AND BILATERAL OOPHEROPEXY;  Surgeon: Benjaman Kindler, MD;  Location: [facility];  Service: Obstetrics;  Laterality: Bilateral;   HAND SURGERY Left    ORIF ELBOW FRACTURE Right 12/02/2012   Procedure: OPEN REDUCTION INTERNAL FIXATION (ORIF) ELBOW/OLECRANON FRACTURE;  Surgeon: Schuyler Amor, MD;  Location: [place];  Service: Orthopedics;  Laterality: Right;   PORTACATH PLACEMENT N/A 12/26/2021   Procedure: INSERTION PORT-A-CATH;  Surgeon: Herbert Pun, MD;  Location: [facility];  Service: General;  Laterality: N/A;    Current Medications: Current Meds  Medication Sig   HYDROcodone-acetaminophen (NORCO) 5-325 MG tablet Take 1  tablet by mouth every 12 (twelve) hours as needed for up to 10 days for moderate pain or severe pain.   lidocaine-prilocaine (EMLA) cream Apply 1 Application topically as needed. Apply to port 1 hour prior to chemotherapy, cover with plastic wrap   nifedipine 0.3 % ointment Compounded with Lidocaine 2%, apply after each bowl movement   ondansetron (ZOFRAN) 8 MG tablet Take 1 tablet (8 mg total) by mouth every 8 (eight) hours as needed for nausea or vomiting. Take one tablet ('8mg'$  total) by mouth every 8hrs as needed. Start on the third day after cisplatin chemotherapy.   prochlorperazine (COMPAZINE) 10 MG tablet Take 1 tablet (10 mg total) by mouth every 6 (six) hours as needed for nausea or vomiting.     Allergies:   Cat hair extract   Social History   Socioeconomic History   Marital status: Significant Other    Spouse name: Not on file   Number of children: Not on file   Years of education: Not on file   Highest education level: Not on file  Occupational History   Not on file  Tobacco Use   Smoking status: Former    Packs/day: 0.25    Years: 1.00    Total pack years: 0.25    Types: Cigarettes    Quit date: 03/28/2019    Years since quitting: 2.7   Smokeless tobacco: Never   Tobacco comments:    electronic- e-sigs-mostly  Vaping Use   Vaping Use: Former   Substances: Nicotine, THC, Flavoring  Substance and Sexual Activity   Alcohol use: Not Currently    Comment: rarely   Drug use: Yes    Types: Marijuana    Comment: Delta 8 THC, Jul 25 2021   Sexual activity: Yes    Birth control/protection: None  Other Topics Concern   Not on file  Social History Narrative   Not on file   Social Determinants of Health   Financial Resource Strain: Not on file  Food Insecurity: Not on file  Transportation Needs: Not on file  Physical Activity: Not on file  Stress: Not on file  Social Connections: Not on file     Family History: The patient's family history includes Cancer in her  paternal aunt and paternal grandmother; Diabetes in her paternal grandfather; Hyperlipidemia in her father; Hypertension in her father; Kidney disease in her paternal grandmother. There is no history of Hearing loss.  ROS:   Please see the history of present illness.     All other systems reviewed and are negative.  EKGs/Labs/Other Studies Reviewed:    The following studies were reviewed today:   EKG:  EKG is  ordered today.  The ekg ordered today demonstrates normal sinus rhythm, normal ECG, heart rate 60  Recent Labs: 01/09/2022: ALT 22; BUN 15; Creatinine, Ser 0.88; Hemoglobin 10.8; Magnesium 1.8; Platelets 230; Potassium 3.8; Sodium 135  Recent Lipid Panel No results found for: "CHOL", "TRIG", "HDL", "CHOLHDL", "VLDL", "LDLCALC", "LDLDIRECT"   Risk Assessment/Calculations:               Physical Exam:    VS:  BP 106/64 (BP Location: Right Arm)   Pulse 60   Ht '5\' 4"'$  (1.626 m)   Wt 188 lb 3.2 oz (85.4 kg)   SpO2 100%   BMI 32.30 kg/m     Wt Readings from Last 3 Encounters:  01/12/22 188 lb 3.2 oz (85.4 kg)  01/05/22 188 lb (85.3 kg)  12/30/21 193 lb (87.5 kg)     GEN:  Well nourished, well developed in no acute distress HEENT: Normal NECK: No JVD; No carotid bruits CARDIAC: RRR, no murmurs, rubs, gallops RESPIRATORY:  Clear to auscultation without rales, wheezing or rhonchi  ABDOMEN: Soft, non-tender, non-distended MUSCULOSKELETAL:  No edema; No deformity  SKIN: Warm and dry NEUROLOGIC:  Alert and oriented x 3 PSYCHIATRIC:  Normal affect   ASSESSMENT:    1. Bradycardia   2. Nausea and vomiting, unspecified vomiting type    PLAN:    In order of problems listed above:  Sinus bradycardia, etiology could be a combination of vasovagal from being nauseous and vomiting at the time versus side effect of cisplatin which is known to cause bradycardia.  EKG today shows improved heart rates in the 60s.  Recommend continued monitoring.  No indication for additional  testing at this time.   Nausea, vomiting likely from malignancy or treatment with chemo agents.  Management as per oncology team.  Follow-up in 6 months.       Medication Adjustments/Labs and Tests Ordered: Current medicines are reviewed at length with the patient today.  Concerns regarding medicines are outlined above.  No orders of the defined types were placed in this encounter.  No orders of the defined types were placed in this encounter.   Patient Instructions  Medication Instructions:  Your physician recommends that you continue on your current medications as directed. Please refer to the Current Medication list given to you today.  *If you need a refill on your cardiac  medications before your next appointment, please call your pharmacy*    Follow-Up: At [facility], you and your health needs are our priority.  As part of our continuing mission to provide you with exceptional heart care, we have created designated Provider Care Teams.  These Care Teams include your primary Cardiologist (physician) and Advanced Practice Providers (APPs -  Physician Assistants and Nurse Practitioners) who all work together to provide you with the care you need, when you need it.  We recommend signing up for the patient portal called "MyChart".  Sign up information is provided on this After Visit Summary.  MyChart is used to connect with patients for Virtual Visits (Telemedicine).  Patients are able to view lab/test results, encounter notes, upcoming appointments, etc.  Non-urgent messages can be sent to your provider as well.   To learn more about what you can do with MyChart, go to [email or web address].    Your next appointment:   6 month(s)  The format for your next appointment:   In Person  Provider:   You may see Kate Sable, MD or one of the following Advanced Practice Providers on your designated Care Team:   Murray Hodgkins, NP Christell Faith, PA-C Cadence Kathlen Mody,  PA-C Gerrie Nordmann, NP    Other Instructions   Important Information About Sugar         Signed, Kate Sable, MD  01/12/2022 2:00 PM    [place]

## 2022-01-12 NOTE — Addendum Note (Signed)
Addended by: Nestor Ramp on: 01/12/2022 04:42 PM   Modules accepted: Orders

## 2022-01-12 NOTE — Patient Instructions (Signed)
Medication Instructions:  Your physician recommends that you continue on your current medications as directed. Please refer to the Current Medication list given to you today.  *If you need a refill on your cardiac medications before your next appointment, please call your pharmacy*    Follow-Up: At [facility], you and your health needs are our priority.  As part of our continuing mission to provide you with exceptional heart care, we have created designated Provider Care Teams.  These Care Teams include your primary Cardiologist (physician) and Advanced Practice Providers (APPs -  Physician Assistants and Nurse Practitioners) who all work together to provide you with the care you need, when you need it.  We recommend signing up for the patient portal called "MyChart".  Sign up information is provided on this After Visit Summary.  MyChart is used to connect with patients for Virtual Visits (Telemedicine).  Patients are able to view lab/test results, encounter notes, upcoming appointments, etc.  Non-urgent messages can be sent to your provider as well.   To learn more about what you can do with MyChart, go to [email or web address].    Your next appointment:   6 month(s)  The format for your next appointment:   In Person  Provider:   You may see Kate Sable, MD or one of the following Advanced Practice Providers on your designated Care Team:   Murray Hodgkins, NP Christell Faith, PA-C Cadence Kathlen Mody, PA-C Gerrie Nordmann, NP    Other Instructions   Important Information About Sugar

## 2022-01-13 ENCOUNTER — Inpatient Hospital Stay: Payer: Medicaid Other

## 2022-01-13 ENCOUNTER — Ambulatory Visit: Admission: RE | Admit: 2022-01-13 | Payer: Medicaid Other | Source: Ambulatory Visit

## 2022-01-13 ENCOUNTER — Ambulatory Visit: Payer: Medicaid Other

## 2022-01-13 ENCOUNTER — Inpatient Hospital Stay (HOSPITAL_BASED_OUTPATIENT_CLINIC_OR_DEPARTMENT_OTHER): Payer: Medicaid Other | Admitting: Internal Medicine

## 2022-01-13 ENCOUNTER — Other Ambulatory Visit: Payer: Self-pay

## 2022-01-13 ENCOUNTER — Ambulatory Visit
Admission: RE | Admit: 2022-01-13 | Discharge: 2022-01-13 | Disposition: A | Discharge status: Home / Self Care | Payer: Medicaid Other | Source: Ambulatory Visit | Attending: Radiation Oncology | Admitting: Radiation Oncology

## 2022-01-13 ENCOUNTER — Encounter: Payer: Self-pay | Admitting: Internal Medicine

## 2022-01-13 VITALS — BP 110/78 | HR 57 | Temp 97.0°F | Ht 64.0 in | Wt 187.0 lb

## 2022-01-13 DIAGNOSIS — Z5111 Encounter for antineoplastic chemotherapy: Secondary | ICD-10-CM

## 2022-01-13 DIAGNOSIS — H9311 Tinnitus, right ear: Secondary | ICD-10-CM | POA: Diagnosis not present

## 2022-01-13 DIAGNOSIS — H9312 Tinnitus, left ear: Secondary | ICD-10-CM

## 2022-01-13 DIAGNOSIS — H9319 Tinnitus, unspecified ear: Secondary | ICD-10-CM | POA: Insufficient documentation

## 2022-01-13 DIAGNOSIS — C539 Malignant neoplasm of cervix uteri, unspecified: Secondary | ICD-10-CM

## 2022-01-13 DIAGNOSIS — K5900 Constipation, unspecified: Secondary | ICD-10-CM | POA: Diagnosis not present

## 2022-01-13 LAB — CBC WITH DIFFERENTIAL/PLATELET
Abs Immature Granulocytes: 0.01 10*3/uL (ref 0.00–0.07)
Basophils Absolute: 0 10*3/uL (ref 0.0–0.1)
Basophils Relative: 1 %
Eosinophils Absolute: 0.2 10*3/uL (ref 0.0–0.5)
Eosinophils Relative: 5 %
HCT: 34.2 % — ABNORMAL LOW (ref 36.0–46.0)
Hemoglobin: 11.2 g/dL — ABNORMAL LOW (ref 12.0–15.0)
Immature Granulocytes: 0 %
Lymphocytes Relative: 23 %
Lymphs Abs: 1 10*3/uL (ref 0.7–4.0)
MCH: 28.3 pg (ref 26.0–34.0)
MCHC: 32.7 g/dL (ref 30.0–36.0)
MCV: 86.4 fL (ref 80.0–100.0)
Monocytes Absolute: 0.3 10*3/uL (ref 0.1–1.0)
Monocytes Relative: 8 %
Neutro Abs: 2.8 10*3/uL (ref 1.7–7.7)
Neutrophils Relative %: 63 %
Platelets: 212 10*3/uL (ref 150–400)
RBC: 3.96 MIL/uL (ref 3.87–5.11)
RDW: 13.2 % (ref 11.5–15.5)
WBC: 4.3 10*3/uL (ref 4.0–10.5)
nRBC: 0 % (ref 0.0–0.2)

## 2022-01-13 LAB — BASIC METABOLIC PANEL
Anion gap: 8 (ref 5–15)
BUN: 8 mg/dL (ref 6–20)
CO2: 25 mmol/L (ref 22–32)
Calcium: 8.7 mg/dL — ABNORMAL LOW (ref 8.9–10.3)
Chloride: 103 mmol/L (ref 98–111)
Creatinine, Ser: 0.82 mg/dL (ref 0.44–1.00)
GFR, Estimated: >60 mL/min (ref >60)
Glucose, Bld: 84 mg/dL (ref 70–99)
Potassium: 3.5 mmol/L (ref 3.5–5.1)
Sodium: 136 mmol/L (ref 135–145)

## 2022-01-13 LAB — MAGNESIUM: Magnesium: 2 mg/dL (ref 1.7–2.4)

## 2022-01-13 MED ORDER — HEPARIN SOD (PORK) LOCK FLUSH 100 UNIT/ML IV SOLN
INTRAVENOUS | Status: AC
  Filled 2022-01-13: qty 5

## 2022-01-13 MED ORDER — SODIUM CHLORIDE 0.9% FLUSH
10.0000 mL | INTRAVENOUS | Status: DC | PRN
  Administered 2022-01-13: 10 mL
  Filled 2022-01-13: qty 10

## 2022-01-13 MED ORDER — ALTEPLASE 2 MG IJ SOLR
2.0000 mg | Freq: Once | INTRAMUSCULAR | Status: AC | PRN
  Administered 2022-01-13: 2 mg
  Filled 2022-01-13: qty 2

## 2022-01-13 MED ORDER — SENNA 8.6 MG PO TABS: 2.0000 | ORAL_TABLET | Freq: Every evening | ORAL | 0 refills | Status: AC | PRN

## 2022-01-13 NOTE — Progress Notes (Signed)
Patient here for follow up. Patient has hemorrhoids from diarrhea. She has to pick up compound cream today. Patient complains of constipation.

## 2022-01-13 NOTE — Addendum Note (Signed)
Addended by: Nestor Ramp on: 01/13/2022 11:12 AM   Modules accepted: Orders

## 2022-01-13 NOTE — Progress Notes (Signed)
[facility] CONSULT NOTE  Patient Care Team: Pcp, No as PCP - General Linda Hedges, CNM as PCP - OBGYN (Certified Nurse Midwife) Clent Jacks, RN as Oncology Nurse Navigator  REFERRING PROVIDER: Dr. Fransisca Connors   REASON FOR REFFERAL: newly diagnosed cervical cancer   CANCER STAGING   Cancer Staging  Cervical cancer, FIGO stage IB ([place]) Staging form: Cervix Uteri, AJCC Version 9 - Clinical stage from 11/15/2021: FIGO Stage IB3 (cT1b3, cN0, cM0) - Signed by Jane Canary, MD on 12/06/2021 Histopathologic type: Squamous cell carcinoma, NOS Stage prefix: Initial diagnosis  Current treatment -Pelvis IMRT started 12/26/2021 25 fractions planned -Weekly cisplatin 40 mg/m2 x5 cycles started 12/30/2021  ASSESSMENT & PLAN:  Kylie [name] 31 y.o. 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  -Started IMRT with Dr. Donella Stade on 12/26/2021.  Weekly cisplatin cycle 1 40 mg/m2 started on 12/30/2021.    - After cycle 2 of cisplatin, she was seen in [facility] for nausea, diarrhea and headache.    -Labs reviewed and acceptable to proceed with cycle 3 of cisplatin.  Her port was not working today and had tPA injected which took about an hour.  Patient later mentioned that she has to pick up her children from school at 3:00 and infusion would not be completed by then.  As a result, treatment was discontinued for today.  After multiple changes in the infusion schedule and conversation with the staff, the patient was accommodated for  cycle 3 on Monday 10/23.  Moving forward, she will be decoupled for her treatments.  -Using Norco 5-325 every 12 as needed for body aches after chemo.  #Tinnitus  -Noticed tinnitus in left ear started 01/12/2022 happened twice. -Obtain audiometric testing -I will closely monitor symptoms.  If it further worsens will change the chemotherapy to carboplatin.  #Sinus Bradycardia  - She had episodes of bradycardia with cycle 2.  EKG showed sinus bradycardia.  She was seen by cardiology and monitoring advised.  Could be vasovagal from nausea versus cisplatin.    #Constipation  - metamucil. Added senna   #Hypocalcemia, mild -Patient advised to take calcium supplements.  # Postpartum  - had C section on 8/25. Follows with Dr. Leafy Ro.   # Access - port 12/26/2021 due to difficult peripheral access  RTC on 10/23 for C3 cisplatin  In 1 week for md visit, labs, c4 decouple   Orders Placed This Encounter  Procedures   CBC with Differential    Standing Status:   Future    Standing Expiration Date:   16/11/6787   Basic metabolic panel    Standing Status:   Future    Standing Expiration Date:   01/31/2023   Magnesium    Standing Status:   Future    Standing Expiration Date:   01/31/2023   Acoustic reflex testing    Patient c/o tinnitus. On weekly cisplatin    Standing Status:   Future    Standing Expiration Date:   01/14/2023    Order Specific Question:   Where should this test  be performed?    Answer:   [facility])    The total time spent in the appointment was 40 minutes encounter with patients including review of chart and various tests results, discussions about plan of care and coordination of care plan   All questions were answered. The patient knows to call the clinic with any problems, questions or concerns. No barriers to learning was detected.  Jane Canary, MD 10/20/20235:07 PM   HISTORY OF PRESENTING ILLNESS:  Kylie [name] 31 y.o. female with pmh  of anxiety following with medical oncology for management of stage Ib 3 cervical squamous cell cancer.  Patient seen today prior to cycle 3 of weekly cisplatin for toxicity check.   Patient was seen in the symptom management clinic for nausea, vomiting, diarrhea and headache after cycle 2.  She received IV hydration.  Diarrhea improved.  But now she is constipated and also has hemorrhoids.  She will pick up the compound cream compound cream from pharmacy today and has started Metamucil.  Also she is feeling bloated.  Denies neuropathy.  Denies fever, chills. She has tinnitus in her left ear which started yesterday and happened twice lasting couple of minutes.  Denies any decreased hearing.   I have reviewed her chart and materials related to her cancer extensively and collaborated history with the patient. Summary of oncologic history is as follows: Oncology History  Cervical cancer, FIGO stage IB ([place])  11/08/2021 Initial Diagnosis   Patient [id number] 35w pregnant admitted on 11/11/2021 by Dr. Leafy Ro for cervical mass, preterm contractions, vaginal bleeding and discharge.  Sample of cervical mass were taken and sent to pathology.   11/15/2021 Cancer Staging   Staging form: Cervix Uteri, AJCC Version 9 - Clinical stage from 11/15/2021: FIGO Stage IB3 (cT1b3, cN0, cM0) - Signed by Jane Canary, MD on 12/06/2021 Histopathologic type: Squamous cell carcinoma, NOS Stage prefix: Initial diagnosis   11/15/2021 Pathology Results   DIAGNOSIS:  A.  CERVIX; BIOPSY:  - INVASIVE SQUAMOUS CELL CARCINOMA.  - SEE COMMENT.   Comment:  The biopsy fragments demonstrate moderately differentiated invasive squamous cell carcinoma.  Due to tangential sectioning the depth of invasion cannot be determined.    11/18/2021 Procedure   She delivered . S/p primary Classical Cesarean Section through Pfannenstiel incision; bilateral tubal sterilization by salpingectomy; bilateral ovarian transposition into the paracolic  gutters with hemoclips placed on the medial borders of the ovaries to identify during radiation   12/30/2021 -  Chemotherapy   Patient is on Treatment Plan : CERVICAL Cisplatin (40) q7d + XRT        MEDICAL HISTORY:  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, doing better now   Headache(784.0)    UTI (lower urinary tract infection)     SURGICAL HISTORY: Past Surgical History:  Procedure Laterality Date   CESAREAN SECTION WITH BILATERAL TUBAL LIGATION Bilateral 11/17/2021   Procedure: CLASSICAL CESAREAN SECTION WITH BILATERAL TUBAL LIGATION BY SALPINGECTOMY AND BILATERAL OOPHEROPEXY;  Surgeon: Benjaman Kindler, MD;  Location: [facility] ORS;  Service: Obstetrics;  Laterality: Bilateral;   HAND SURGERY Left    ORIF ELBOW FRACTURE Right 12/02/2012   Procedure: OPEN REDUCTION INTERNAL FIXATION (ORIF) ELBOW/OLECRANON FRACTURE;  Surgeon: Schuyler Amor, MD;  Location: [place];  Service: Orthopedics;  Laterality: Right;   PORTACATH PLACEMENT N/A 12/26/2021   Procedure: INSERTION PORT-A-CATH;  Surgeon: Herbert Pun, MD;  Location: [facility] ORS;  Service:  General;  Laterality: N/A;    SOCIAL HISTORY: Social History   Socioeconomic History   Marital status: Significant Other    Spouse name: Not on file   Number of children: Not on file   Years of education: Not on file   Highest education level: Not on file  Occupational History   Not on file  Tobacco Use   Smoking status: Former    Packs/day: 0.25    Years: 1.00    Total pack years: 0.25    Types: Cigarettes    Quit date: 03/28/2019    Years since quitting: 2.8   Smokeless tobacco: Never   Tobacco comments:    electronic- e-sigs-mostly  Vaping Use   Vaping Use: Former   Substances: Nicotine, THC, Flavoring  Substance and Sexual Activity   Alcohol use: Not Currently    Comment: rarely   Drug use: Yes    Types: Marijuana    Comment: Delta 8 THC, Jul 25 2021   Sexual  activity: Yes    Birth control/protection: None  Other Topics Concern   Not on file  Social History Narrative   Not on file   Social Determinants of Health   Financial Resource Strain: Not on file  Food Insecurity: Not on file  Transportation Needs: Not on file  Physical Activity: Not on file  Stress: Not on file  Social Connections: Not on file  Intimate Partner Violence: Not on file    FAMILY HISTORY: Family History  Problem Relation Age of Onset   Hyperlipidemia Father    Hypertension Father    Cancer Paternal Grandmother    Kidney disease Paternal Grandmother    Diabetes Paternal Grandfather    Cancer Paternal Aunt    Hearing loss Neg Hx     ALLERGIES:  is allergic to cat hair extract.  MEDICATIONS:  Current Outpatient Medications  Medication Sig Dispense Refill   HYDROcodone-acetaminophen (NORCO) 5-325 MG tablet Take 1 tablet by mouth every 12 (twelve) hours as needed for up to 10 days for moderate pain or severe pain. 20 tablet 0   lidocaine-prilocaine (EMLA) cream Apply 1 Application topically as needed. Apply to port 1 hour prior to chemotherapy, cover with plastic wrap 30 g 3   nifedipine 0.3 % ointment Compounded with Lidocaine 2%, apply after each bowl movement     ondansetron (ZOFRAN) 8 MG tablet Take 1 tablet (8 mg total) by mouth every 8 (eight) hours as needed for nausea or vomiting. Take one tablet ('8mg'$  total) by mouth every 8hrs as needed. Start on the third day after cisplatin chemotherapy. 30 tablet 1   prochlorperazine (COMPAZINE) 10 MG tablet Take 1 tablet (10 mg total) by mouth every 6 (six) hours as needed for nausea or vomiting. 30 tablet 1   senna (SENOKOT) 8.6 MG TABS tablet Take 2 tablets (17.2 mg total) by mouth at bedtime as needed for up to 7 days (constipation). 14 tablet 0   No current facility-administered medications for this visit.   Facility-Administered Medications Ordered in Other Visits  Medication Dose Route Frequency Provider Last  Rate Last Admin   heparin lock flush 100 UNIT/ML injection            sodium chloride flush (NS) 0.9 % injection 10 mL  10 mL Intracatheter PRN Jane Canary, MD   10 mL at 01/13/22 0935    REVIEW OF SYSTEMS:   Pertinent information mentioned in HPI All other systems were reviewed with the patient and are negative.  PHYSICAL EXAMINATION: ECOG PERFORMANCE STATUS: 0 - Asymptomatic  Vitals:   01/13/22 0950  BP: 110/78  Pulse: (!) 57  Temp: (!) 97 F (36.1 C)     Filed Weights   01/13/22 0950  Weight: 187 lb (84.8 kg)      GENERAL:alert, no distress and comfortable SKIN: skin color, texture, turgor are normal, no rashes or significant lesions EYES: normal, conjunctiva are pink and non-injected, sclera clear OROPHARYNX:no exudate, no erythema and lips, buccal mucosa, and tongue normal  NECK: supple, thyroid normal size, non-tender, without nodularity LYMPH:  no palpable lymphadenopathy in the cervical, axillary or inguinal LUNGS: clear to auscultation and percussion with normal breathing effort HEART: regular rate & rhythm and no murmurs and no lower extremity edema ABDOMEN:abdomen soft, non-tender and normal bowel sounds Musculoskeletal:no cyanosis of digits and no clubbing  PSYCH: alert & oriented x 3 with fluent speech NEURO: no focal motor/sensory deficits  LABORATORY DATA:  I have reviewed the data as listed Lab Results  Component Value Date   WBC 4.3 01/13/2022   HGB 11.2 (L) 01/13/2022   HCT 34.2 (L) 01/13/2022   MCV 86.4 01/13/2022   PLT 212 01/13/2022   Recent Labs    12/22/21 1322 12/30/21 0846 01/05/22 1012 01/09/22 1038 01/13/22 0919  NA 138 137 136 135 136  K 3.8 3.5 4.3 3.8 3.5  CL 106 103 103 105 103  CO2 '28 26 25 25 25  '$ GLUCOSE 89 116* 89 86 84  BUN '9 10 11 15 8  '$ CREATININE 0.96 0.89 0.73 0.88 0.82  CALCIUM 8.6* 8.9 8.6* 8.4* 8.7*  GFRNONAA >60 >60 >60 >60 >60  PROT 7.5 7.0  --  6.9  --   ALBUMIN 3.4* 3.3*  --  3.4*  --   AST 15 18  --   18  --   ALT 11 16  --  22  --   ALKPHOS 95 89  --  70  --   BILITOT 0.5 0.8  --  0.6  --     RADIOGRAPHIC STUDIES: I have personally reviewed the radiological images as listed and agreed with the findings in the report. DG Chest Port 1 View  Result Date: 12/26/2021 CLINICAL DATA:  Port-A-Cath in place. EXAM: PORTABLE CHEST 1 VIEW COMPARISON:  September 18, 2020 FINDINGS: A Port-A-Cath is in good position. The distal tip terminates in the central SVC. No pneumothorax. The heart, hila, and mediastinum are unremarkable. No pulmonary nodules or masses. No suspicious infiltrates. Mild atelectasis in the bases. IMPRESSION: The right Port-A-Cath is in good position as above. No pneumothorax. No acute abnormalities. Mild atelectasis in the bases. Electronically Signed   By: Dorise Bullion III M.D.   On: 12/26/2021 09:04   DG C-Arm 1-60 Min-No Report  Result Date: 12/26/2021 Fluoroscopy was utilized by the requesting physician.  No radiographic interpretation.

## 2022-01-13 NOTE — Progress Notes (Signed)
Unable to obtain blood from port. TPA administered, still no blood return. Pt unable to stay for duration of treatment due to having to pick up her kids from school and treatment would not finish in time. Rescheduling today's tx to Monday 10/23. Will attempt to get blood return on Monday

## 2022-01-16 ENCOUNTER — Inpatient Hospital Stay: Payer: Medicaid Other

## 2022-01-16 ENCOUNTER — Ambulatory Visit: Payer: Medicaid Other

## 2022-01-16 ENCOUNTER — Other Ambulatory Visit: Payer: Self-pay | Admitting: Internal Medicine

## 2022-01-16 VITALS — BP 118/76 | HR 61 | Temp 96.9°F | Resp 16 | Wt 188.5 lb

## 2022-01-16 DIAGNOSIS — Z5111 Encounter for antineoplastic chemotherapy: Secondary | ICD-10-CM | POA: Diagnosis not present

## 2022-01-16 DIAGNOSIS — C539 Malignant neoplasm of cervix uteri, unspecified: Secondary | ICD-10-CM

## 2022-01-16 MED ORDER — SODIUM CHLORIDE 0.9 % IV SOLN
40.0000 mg/m2 | Freq: Once | INTRAVENOUS | Status: AC
  Administered 2022-01-16: 78 mg via INTRAVENOUS
  Filled 2022-01-16: qty 78

## 2022-01-16 MED ORDER — SODIUM CHLORIDE 0.9 % IV SOLN
10.0000 mg | Freq: Once | INTRAVENOUS | Status: AC
  Administered 2022-01-16: 10 mg via INTRAVENOUS
  Filled 2022-01-16: qty 10

## 2022-01-16 MED ORDER — SODIUM CHLORIDE 0.9 % IV SOLN
150.0000 mg | Freq: Once | INTRAVENOUS | Status: AC
  Administered 2022-01-16: 150 mg via INTRAVENOUS
  Filled 2022-01-16: qty 150

## 2022-01-16 MED ORDER — PALONOSETRON HCL INJECTION 0.25 MG/5ML
0.2500 mg | Freq: Once | INTRAVENOUS | Status: AC
  Administered 2022-01-16: 0.25 mg via INTRAVENOUS
  Filled 2022-01-16: qty 5

## 2022-01-16 MED ORDER — FAMOTIDINE IN NACL 20-0.9 MG/50ML-% IV SOLN
20.0000 mg | Freq: Once | INTRAVENOUS | Status: AC | PRN
  Administered 2022-01-16: 20 mg via INTRAVENOUS

## 2022-01-16 MED ORDER — POTASSIUM CHLORIDE IN NACL 20-0.9 MEQ/L-% IV SOLN
Freq: Once | INTRAVENOUS | Status: AC
  Filled 2022-01-16: qty 1000

## 2022-01-16 MED ORDER — HEPARIN SOD (PORK) LOCK FLUSH 100 UNIT/ML IV SOLN
INTRAVENOUS | Status: AC
  Administered 2022-01-16: 500 [IU]
  Filled 2022-01-16: qty 5

## 2022-01-16 MED ORDER — MAGNESIUM SULFATE 2 GM/50ML IV SOLN
2.0000 g | Freq: Once | INTRAVENOUS | Status: AC
  Administered 2022-01-16: 2 g via INTRAVENOUS
  Filled 2022-01-16: qty 50

## 2022-01-16 MED ORDER — METHYLPREDNISOLONE SODIUM SUCC 125 MG IJ SOLR
125.0000 mg | Freq: Once | INTRAMUSCULAR | Status: AC | PRN
  Administered 2022-01-16: 125 mg via INTRAVENOUS

## 2022-01-16 MED ORDER — DEXAMETHASONE 4 MG PO TABS: 4.0000 mg | ORAL_TABLET | Freq: Two times a day (BID) | ORAL | 0 refills | Status: AC

## 2022-01-16 MED ORDER — HEPARIN SOD (PORK) LOCK FLUSH 100 UNIT/ML IV SOLN
500.0000 [IU] | Freq: Once | INTRAVENOUS | Status: AC | PRN
  Filled 2022-01-16: qty 5

## 2022-01-16 MED ORDER — SODIUM CHLORIDE 0.9 % IV SOLN
Freq: Once | INTRAVENOUS | Status: DC | PRN
  Filled 2022-01-16: qty 250

## 2022-01-16 MED ORDER — DIPHENHYDRAMINE HCL 50 MG/ML IJ SOLN
50.0000 mg | Freq: Once | INTRAMUSCULAR | Status: AC | PRN
  Administered 2022-01-16: 25 mg via INTRAVENOUS

## 2022-01-16 MED ORDER — SODIUM CHLORIDE 0.9 % IV SOLN
Freq: Once | INTRAVENOUS | Status: AC
  Filled 2022-01-16: qty 250

## 2022-01-16 NOTE — Patient Instructions (Signed)
[facility]  Discharge Instructions: Thank you for choosing [facility] to provide your oncology and hematology care.  If you have a lab appointment with the Cancer Center, please go directly to the Cancer Center and check in at the registration area.  Wear comfortable clothing and clothing appropriate for easy access to any Portacath or PICC line.   We strive to give you quality time with your provider. You may need to reschedule your appointment if you arrive late (15 or more minutes).  Arriving late affects you and other patients whose appointments are after yours.  Also, if you miss three or more appointments without notifying the office, you may be dismissed from the clinic at the provider's discretion.      For prescription refill requests, have your pharmacy contact our office and allow 72 hours for refills to be completed.    Today you received the following chemotherapy and/or immunotherapy agents Cisplatin    To help prevent nausea and vomiting after your treatment, we encourage you to take your nausea medication as directed.  BELOW ARE SYMPTOMS THAT SHOULD BE REPORTED IMMEDIATELY: *FEVER GREATER THAN 100.4 F (38 C) OR HIGHER *CHILLS OR SWEATING *NAUSEA AND VOMITING THAT IS NOT CONTROLLED WITH YOUR NAUSEA MEDICATION *UNUSUAL SHORTNESS OF BREATH *UNUSUAL BRUISING OR BLEEDING *URINARY PROBLEMS (pain or burning when urinating, or frequent urination) *BOWEL PROBLEMS (unusual diarrhea, constipation, pain near the anus) TENDERNESS IN MOUTH AND THROAT WITH OR WITHOUT PRESENCE OF ULCERS (sore throat, sores in mouth, or a toothache) UNUSUAL RASH, SWELLING OR PAIN  UNUSUAL VAGINAL DISCHARGE OR ITCHING   Items with * indicate a potential emergency and should be followed up as soon as possible or go to the Emergency Department if any problems should occur.  Please show the CHEMOTHERAPY ALERT CARD or IMMUNOTHERAPY ALERT CARD at check-in to the  Emergency Department and triage nurse.  Should you have questions after your visit or need to cancel or reschedule your appointment, please contact [facility]  [phone] and follow the prompts.  Office hours are 8:00 a.m. to 4:30 p.m. Monday - Friday. Please note that voicemails left after 4:00 p.m. may not be returned until the following business day.  We are closed weekends and major holidays. You have access to a nurse at all times for urgent questions. Please call the main number to the clinic [phone] and follow the prompts.  For any non-urgent questions, you may also contact your provider using MyChart. We now offer e-Visits for anyone 18 and older to request care online for non-urgent symptoms. For details visit [email or web address].   Also download the MyChart app! Go to the app store, search "MyChart", open the app, select [facility], and log in with your MyChart username and password.  Masks are optional in the cancer centers. If you would like for your care team to wear a mask while they are taking care of you, please let them know. For doctor visits, patients may have with them one support person who is at least 33 years old. At this time, visitors are not allowed in the infusion area.   

## 2022-01-16 NOTE — Progress Notes (Signed)
Discontinued emend from her treatment plan due to hives, burning sensation and itching.   Sent script for dexamethasone 4 mg bid x 3 days post chemotherapy for nausea/vomiting.    01/18/22.  Patient called for pain meds. Pharmacy only dispensed 10 pills because that's all insurance would cover. Patient is in pain after chemo. Ordered script for norco #20 pills. Pt will pay out of pocket.

## 2022-01-16 NOTE — Progress Notes (Signed)
Hypersensitivity Reaction note  Date of event: 01/16/22  Time of event: 1210  Generic name of drug involved: [place]  Name of provider notified of the hypersensitivity reaction: Judson Roch, PA  Was agent that likely caused hypersensitivity reaction added to Allergies List within EMR? Yes  Chain of events including reaction signs/symptoms, treatment administered, and outcome (e.g., drug resumed; drug discontinued; sent to Emergency Department; etc.)   0786: Pt developed hives on face, arm and hands. Emend stopped, PA Sarah notified. Burning and itching sensation to face, left arm hand and scalp.  1211 IVF started. Judson Roch, PA at chairside.   1212 Benadryl '25mg'$  given x1. Solumedrol '125mg'$  iv given x1.   1213 VS obtained (see flowsheet), Pepcid '20mg'$  IVPB given x 1.  1218 VS obtained. Hives looking better, Still reports burning and itching.   1222. Continue full liter of IVF.   1237: Hives no longer present. Pt reports resolution of symptoms.  1327: Notified PA of completion of NS bolus. Per Judson Roch, [place], ok to proceed with tx.  1546 Tx completed without further incident  Wilford Corner, RN 01/16/2022

## 2022-01-16 NOTE — Progress Notes (Signed)
Pt provided 175cc urine, ok to proceed with cisplatin per secure chat with Dr. Darrall Dears

## 2022-01-17 ENCOUNTER — Ambulatory Visit: Payer: Medicaid Other

## 2022-01-17 DIAGNOSIS — Z5111 Encounter for antineoplastic chemotherapy: Secondary | ICD-10-CM | POA: Diagnosis not present

## 2022-01-18 ENCOUNTER — Telehealth: Payer: Self-pay | Admitting: Cardiology

## 2022-01-18 ENCOUNTER — Ambulatory Visit
Admission: RE | Admit: 2022-01-18 | Discharge: 2022-01-18 | Disposition: A | Discharge status: Home / Self Care | Payer: Medicaid Other | Source: Ambulatory Visit | Attending: Radiation Oncology | Admitting: Radiation Oncology

## 2022-01-18 ENCOUNTER — Encounter: Payer: Self-pay | Admitting: Internal Medicine

## 2022-01-18 ENCOUNTER — Other Ambulatory Visit: Payer: Self-pay

## 2022-01-18 DIAGNOSIS — Z5111 Encounter for antineoplastic chemotherapy: Secondary | ICD-10-CM | POA: Diagnosis not present

## 2022-01-18 LAB — RAD ONC ARIA SESSION SUMMARY
Course Elapsed Days: 23
Plan Fractions Treated to Date: 1
Plan Prescribed Dose Per Fraction: 2 Gy
Plan Total Fractions Prescribed: 12
Plan Total Prescribed Dose: 24 Gy
Reference Point Dosage Given to Date: 28 Gy
Reference Point Session Dosage Given: 2 Gy
Session Number: 14

## 2022-01-18 MED ORDER — HYDROCODONE-ACETAMINOPHEN 5-325 MG PO TABS: 1.0000 | ORAL_TABLET | Freq: Two times a day (BID) | ORAL | 0 refills | Status: AC | PRN

## 2022-01-18 NOTE — Telephone Encounter (Signed)
STAT if HR is under 50 or over 120 (normal HR is 60-100 beats per minute)  What is your heart rate? 63  Do you have a log of your heart rate readings (document readings)? Staying right above 60's   Do you have any other symptoms? No.  Pt was told by her oncologist advise she call and get an appt with her cardiologist due to not being able to get heart rate above 60's.

## 2022-01-18 NOTE — Telephone Encounter (Signed)
Returned call to patient, message left to call back and ask to speak with someone in either [facility] triage or [facility] office triage. Georgana Curio MHA RN CCM

## 2022-01-18 NOTE — Addendum Note (Signed)
Addended byJane Canary on: 01/18/2022 01:34 PM   Modules accepted: Orders

## 2022-01-18 NOTE — Telephone Encounter (Signed)
Patient is returning call. Please advise? 

## 2022-01-18 NOTE — Telephone Encounter (Signed)
Patient returned call, she has already been scheduled to be seen tomorrow in the [place] office. No further help needed at this time.

## 2022-01-18 NOTE — Telephone Encounter (Signed)
Called patient to get more information as we had just seen her on 01/12/22 with awareness of her HR running in the 60's.   Patient stated that her heart rate has been in the 30's and 40's at times on her pulse oximeter at home. Patient had a normal EKG at recent visit.  Will keep appointment as scheduled.

## 2022-01-19 ENCOUNTER — Other Ambulatory Visit: Payer: Self-pay

## 2022-01-19 ENCOUNTER — Encounter: Payer: Self-pay | Admitting: Cardiology

## 2022-01-19 ENCOUNTER — Ambulatory Visit: Payer: Medicaid Other | Attending: Cardiology | Admitting: Cardiology

## 2022-01-19 ENCOUNTER — Ambulatory Visit
Admission: RE | Admit: 2022-01-19 | Discharge: 2022-01-19 | Disposition: A | Discharge status: Home / Self Care | Payer: Medicaid Other | Source: Ambulatory Visit | Attending: Radiation Oncology | Admitting: Radiation Oncology

## 2022-01-19 VITALS — BP 143/84 | HR 45 | Ht 64.0 in | Wt 193.0 lb

## 2022-01-19 DIAGNOSIS — R001 Bradycardia, unspecified: Secondary | ICD-10-CM | POA: Diagnosis not present

## 2022-01-19 DIAGNOSIS — T451X5A Adverse effect of antineoplastic and immunosuppressive drugs, initial encounter: Secondary | ICD-10-CM | POA: Diagnosis not present

## 2022-01-19 DIAGNOSIS — Z5111 Encounter for antineoplastic chemotherapy: Secondary | ICD-10-CM | POA: Diagnosis not present

## 2022-01-19 LAB — RAD ONC ARIA SESSION SUMMARY
Course Elapsed Days: 24
Plan Fractions Treated to Date: 2
Plan Prescribed Dose Per Fraction: 2 Gy
Plan Total Fractions Prescribed: 12
Plan Total Prescribed Dose: 24 Gy
Reference Point Dosage Given to Date: 30 Gy
Reference Point Session Dosage Given: 2 Gy
Session Number: 15

## 2022-01-19 NOTE — Progress Notes (Signed)
Cardiology Clinic Note   Patient Name: Kylie [name] Date of Encounter: 01/19/2022  Primary Care Provider:  Pcp, No Primary Cardiologist:  None  Patient Profile    31 year old female with history of cervical cancer on chemotherapy and recent bradycardia, who presents today for follow-up with concerns of lowering heart rate into the 30s and 40s.  Past Medical History    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, doing better now   Headache(784.0)    UTI (lower urinary tract infection)    Past Surgical History:  Procedure Laterality Date   CESAREAN SECTION WITH BILATERAL TUBAL LIGATION Bilateral 11/17/2021   Procedure: CLASSICAL CESAREAN SECTION WITH BILATERAL TUBAL LIGATION BY SALPINGECTOMY AND BILATERAL OOPHEROPEXY;  Surgeon: Benjaman Kindler, MD;  Location: [facility];  Service: Obstetrics;  Laterality: Bilateral;   HAND SURGERY Left    ORIF ELBOW FRACTURE Right 12/02/2012   Procedure: OPEN REDUCTION INTERNAL FIXATION (ORIF) ELBOW/OLECRANON FRACTURE;  Surgeon: Schuyler Amor, MD;  Location: [place];  Service: Orthopedics;  Laterality: Right;   PORTACATH PLACEMENT N/A 12/26/2021   Procedure: INSERTION PORT-A-CATH;  Surgeon: Herbert Pun, MD;  Location: [facility];  Service: General;  Laterality: N/A;    Allergies  Allergies  Allergen Reactions   Cat Hair Extract Swelling   Emend [Aprepitant] Hives    Itching, Hives and burning sensation    History of Present Illness    Kylie [name] is a 31 year old female with history of cervical cancer on chemotherapy who is now having episodes of bradycardia.  She had been recently diagnosed with cervical cancer, undergoing chemotherapy every Friday, 3 days prior she started feeling sick with nausea vomiting.  She was evaluated at the cancer center pulse was low.  EKG showed sinus bradycardia with a rate of 47.  She was last seen in clinic on 01/12/2022 by  Dr. Garen Lah after episode been evaluated at the consistent with a heart rate of 47.  EKG that was done in clinic showed sinus rhythm with a rate of 60.    She had called the nurse line several times concerned as her pulse oximetry was picking up heart rates in the 30s and 40s.  She returns to clinic today with concerns of worsening bradycardia.  She stated she had had an allergic reaction to her chemo during her third treatment earlier in the week and since that time her heart rate has been staying in the 50's.  She brought a pulse oximeter to be able to monitor her heart rate while she was at home.  During the she had complained of some chest tightness and some shortness of breath and then after had worsening fatigue.  One of her chemo agents is cisplatin.  Home Medications    Current Outpatient Medications  Medication Sig Dispense Refill   dexamethasone (DECADRON) 4 MG tablet Take 1 tablet (4 mg total) by mouth 2 (two) times daily with a meal for 3 days. 6 tablet 0   HYDROcodone-acetaminophen (NORCO) 5-325 MG tablet Take 1 tablet by mouth every 12 (twelve) hours as needed for up to 10 days for moderate pain. 20 tablet 0   lidocaine-prilocaine (EMLA) cream Apply 1 Application topically as needed. Apply to port 1 hour prior to chemotherapy, cover with plastic wrap 30 g 3   nifedipine 0.3 % ointment Compounded with Lidocaine 2%, apply after each bowl movement     ondansetron (ZOFRAN)  8 MG tablet Take 1 tablet (8 mg total) by mouth every 8 (eight) hours as needed for nausea or vomiting. Take one tablet ('8mg'$  total) by mouth every 8hrs as needed. Start on the third day after cisplatin chemotherapy. 30 tablet 1   prochlorperazine (COMPAZINE) 10 MG tablet Take 1 tablet (10 mg total) by mouth every 6 (six) hours as needed for nausea or vomiting. 30 tablet 1   senna (SENOKOT) 8.6 MG TABS tablet Take 2 tablets (17.2 mg total) by mouth at bedtime as needed for up to 7 days (constipation). 14 tablet 0    No current facility-administered medications for this visit.     Family History    Family History  Problem Relation Age of Onset   Hyperlipidemia Father    Hypertension Father    Cancer Paternal Grandmother    Kidney disease Paternal Grandmother    Diabetes Paternal Grandfather    Cancer Paternal Aunt    Hearing loss Neg Hx    She indicated that the status of her father is unknown. She indicated that the status of her paternal grandmother is unknown. She indicated that the status of her paternal grandfather is unknown. She indicated that the status of her paternal aunt is unknown. She indicated that the status of her neg hx is unknown.  Social History    Social History   Socioeconomic History   Marital status: Significant Other    Spouse name: Not on file   Number of children: Not on file   Years of education: Not on file   Highest education level: Not on file  Occupational History   Not on file  Tobacco Use   Smoking status: Former    Packs/day: 0.25    Years: 1.00    Total pack years: 0.25    Types: Cigarettes    Quit date: 03/28/2019    Years since quitting: 2.8   Smokeless tobacco: Never   Tobacco comments:    electronic- e-sigs-mostly  Vaping Use   Vaping Use: Former   Substances: Nicotine, THC, Flavoring  Substance and Sexual Activity   Alcohol use: Not Currently    Comment: rarely   Drug use: Yes    Types: Marijuana    Comment: Delta 8 THC, Jul 25 2021   Sexual activity: Yes    Birth control/protection: None  Other Topics Concern   Not on file  Social History Narrative   Not on file   Social Determinants of Health   Financial Resource Strain: Not on file  Food Insecurity: Not on file  Transportation Needs: Not on file  Physical Activity: Not on file  Stress: Not on file  Social Connections: Not on file  Intimate Partner Violence: Not on file     Review of Systems    General:  No chills, fever, night sweats or weight changes.  Endorses  extreme fatigue Cardiovascular: Endorses chest pain, dyspnea on exertion, edema, orthopnea, palpitations, paroxysmal nocturnal dyspnea. Dermatological: No rash, lesions/masses Respiratory: No cough, endorses dyspnea Urologic: No hematuria, dysuria Abdominal:   No nausea, vomiting, diarrhea, bright red blood per rectum, melena, or hematemesis Neurologic:  No visual changes, wkns, changes in mental status. All other systems reviewed and are otherwise negative except as noted above.   Physical Exam    VS:  BP (!) 143/84 (BP Location: Left Arm, Patient Position: Sitting, Cuff Size: Large)   Pulse (!) 45   Ht '5\' 4"'$  (1.626 m)   Wt 193 lb (87.5 kg)  SpO2 100%   BMI 33.13 kg/m  , BMI Body mass index is 33.13 kg/m.     GEN: Well nourished, well developed, in no acute distress. HEENT: normal. Neck: Supple, no JVD, carotid bruits, or masses. Cardiac: RRR, no murmurs, rubs, or gallops. No clubbing, cyanosis, edema.  Radials/DP/PT 2+ and equal bilaterally.  Respiratory:  Respirations regular and unlabored, clear to auscultation bilaterally. GI: Soft, nontender, nondistended, BS + x 4. MS: no deformity or atrophy. Skin: warm and dry, no rash. Neuro:  Strength and sensation are intact. Psych: Normal affect.  Accessory Clinical Findings    ECG personally reviewed by me today-  - No acute changes  Lab Results  Component Value Date   WBC 4.3 01/13/2022   HGB 11.2 (L) 01/13/2022   HCT 34.2 (L) 01/13/2022   MCV 86.4 01/13/2022   PLT 212 01/13/2022   Lab Results  Component Value Date   CREATININE 0.82 01/13/2022   BUN 8 01/13/2022   NA 136 01/13/2022   K 3.5 01/13/2022   CL 103 01/13/2022   CO2 25 01/13/2022   Lab Results  Component Value Date   ALT 22 01/09/2022   AST 18 01/09/2022   ALKPHOS 70 01/09/2022   BILITOT 0.6 01/09/2022   No results found for: "CHOL", "HDL", "LDLCALC", "LDLDIRECT", "TRIG", "CHOLHDL"  No results found for: "HGBA1C"  Assessment & Plan   1.  Sinus  bradycardia rate of 55 on EKG today.  Patient has a reaction to chemo and heart rate is continue to remain low.  She is on cisplatin which is known to cause bradycardia.  She has been scheduled for an echocardiogram as well to assess heart function structure and for any valvular abnormality.  She has been recommended to continue with monitoring with a pulse oximeter at home.  If continued symptomatic or worsening bradycardia and chemo agent will likely need to be changed.  2.  Continued nausea and vomiting from chemo agents.  Management is ongoing per oncology  3.  Disposition patient return to clinic to see MD/APP after testing is completed or sooner if needed.  Linna Thebeau, NP 01/19/2022, 3:39 PM

## 2022-01-19 NOTE — Patient Instructions (Signed)
Medication Instructions:  Your physician recommends that you continue on your current medications as directed. Please refer to the Current Medication list given to you today.  *If you need a refill on your cardiac medications before your next appointment, please call your pharmacy*   Lab Work: NONE If you have labs (blood work) drawn today and your tests are completely normal, you will receive your results only by: Phil Campbell (if you have MyChart) OR A paper copy in the mail If you have any lab test that is abnormal or we need to change your treatment, we will call you to review the results.   Testing/Procedures: ECHO Your physician has requested that you have an echocardiogram. Echocardiography is a painless test that uses sound waves to create images of your heart. It provides your doctor with information about the size and shape of your heart and how well your heart's chambers and valves are working. This procedure takes approximately one hour. There are no restrictions for this procedure. Please do NOT wear cologne, perfume, aftershave, or lotions (deodorant is allowed). Please arrive 15 minutes prior to your appointment time.  Follow-Up: At [facility], you and your health needs are our priority.  As part of our continuing mission to provide you with exceptional heart care, we have created designated Provider Care Teams.  These Care Teams include your primary Cardiologist (physician) and Advanced Practice Providers (APPs -  Physician Assistants and Nurse Practitioners) who all work together to provide you with the care you need, when you need it.  Your next appointment:   2 week(s)  The format for your next appointment:   In Person  Provider:   Gerrie Nordmann, NP      Important Information About Sugar

## 2022-01-20 ENCOUNTER — Inpatient Hospital Stay (HOSPITAL_BASED_OUTPATIENT_CLINIC_OR_DEPARTMENT_OTHER): Payer: Medicaid Other | Admitting: Internal Medicine

## 2022-01-20 ENCOUNTER — Encounter: Payer: Self-pay | Admitting: Internal Medicine

## 2022-01-20 ENCOUNTER — Telehealth: Payer: Self-pay | Admitting: Internal Medicine

## 2022-01-20 ENCOUNTER — Ambulatory Visit
Admission: RE | Admit: 2022-01-20 | Discharge: 2022-01-20 | Disposition: A | Discharge status: Home / Self Care | Payer: Medicaid Other | Source: Ambulatory Visit | Attending: Radiation Oncology | Admitting: Radiation Oncology

## 2022-01-20 ENCOUNTER — Other Ambulatory Visit: Payer: Self-pay

## 2022-01-20 ENCOUNTER — Other Ambulatory Visit: Payer: Medicaid Other

## 2022-01-20 ENCOUNTER — Inpatient Hospital Stay: Payer: Medicaid Other

## 2022-01-20 ENCOUNTER — Ambulatory Visit: Payer: Medicaid Other | Admitting: Internal Medicine

## 2022-01-20 VITALS — BP 116/75 | HR 81 | Temp 98.7°F | Resp 20 | Wt 195.2 lb

## 2022-01-20 DIAGNOSIS — Z95828 Presence of other vascular implants and grafts: Secondary | ICD-10-CM

## 2022-01-20 DIAGNOSIS — H9313 Tinnitus, bilateral: Secondary | ICD-10-CM | POA: Diagnosis not present

## 2022-01-20 DIAGNOSIS — C539 Malignant neoplasm of cervix uteri, unspecified: Secondary | ICD-10-CM

## 2022-01-20 DIAGNOSIS — E876 Hypokalemia: Secondary | ICD-10-CM | POA: Diagnosis not present

## 2022-01-20 DIAGNOSIS — Z5111 Encounter for antineoplastic chemotherapy: Secondary | ICD-10-CM

## 2022-01-20 LAB — CBC WITH DIFFERENTIAL/PLATELET
Abs Immature Granulocytes: 0.02 10*3/uL (ref 0.00–0.07)
Basophils Absolute: 0 10*3/uL (ref 0.0–0.1)
Basophils Relative: 1 %
Eosinophils Absolute: 0.2 10*3/uL (ref 0.0–0.5)
Eosinophils Relative: 4 %
HCT: 29.7 % — ABNORMAL LOW (ref 36.0–46.0)
Hemoglobin: 9.9 g/dL — ABNORMAL LOW (ref 12.0–15.0)
Immature Granulocytes: 0 %
Lymphocytes Relative: 24 %
Lymphs Abs: 1.2 10*3/uL (ref 0.7–4.0)
MCH: 28.5 pg (ref 26.0–34.0)
MCHC: 33.3 g/dL (ref 30.0–36.0)
MCV: 85.6 fL (ref 80.0–100.0)
Monocytes Absolute: 0.4 10*3/uL (ref 0.1–1.0)
Monocytes Relative: 8 %
Neutro Abs: 3.1 10*3/uL (ref 1.7–7.7)
Neutrophils Relative %: 63 %
Platelets: 174 10*3/uL (ref 150–400)
RBC: 3.47 MIL/uL — ABNORMAL LOW (ref 3.87–5.11)
RDW: 14.4 % (ref 11.5–15.5)
WBC: 5 10*3/uL (ref 4.0–10.5)
nRBC: 0 % (ref 0.0–0.2)

## 2022-01-20 LAB — RAD ONC ARIA SESSION SUMMARY
Course Elapsed Days: 25
Plan Fractions Treated to Date: 3
Plan Prescribed Dose Per Fraction: 2 Gy
Plan Total Fractions Prescribed: 12
Plan Total Prescribed Dose: 24 Gy
Reference Point Dosage Given to Date: 32 Gy
Reference Point Session Dosage Given: 2 Gy
Session Number: 16

## 2022-01-20 LAB — BASIC METABOLIC PANEL
Anion gap: 8 (ref 5–15)
BUN: 11 mg/dL (ref 6–20)
CO2: 25 mmol/L (ref 22–32)
Calcium: 8 mg/dL — ABNORMAL LOW (ref 8.9–10.3)
Chloride: 105 mmol/L (ref 98–111)
Creatinine, Ser: 0.93 mg/dL (ref 0.44–1.00)
GFR, Estimated: >60 mL/min (ref >60)
Glucose, Bld: 98 mg/dL (ref 70–99)
Potassium: 2.9 mmol/L — ABNORMAL LOW (ref 3.5–5.1)
Sodium: 138 mmol/L (ref 135–145)

## 2022-01-20 LAB — MAGNESIUM: Magnesium: 1.4 mg/dL — ABNORMAL LOW (ref 1.7–2.4)

## 2022-01-20 MED ORDER — DEXAMETHASONE 4 MG PO TABS: 4.0000 mg | ORAL_TABLET | Freq: Two times a day (BID) | ORAL | 0 refills | Status: AC

## 2022-01-20 MED ORDER — MAGNESIUM OXIDE -MG SUPPLEMENT 400 (240 MG) MG PO TABS: 400.0000 mg | ORAL_TABLET | Freq: Two times a day (BID) | ORAL | 0 refills | Status: DC

## 2022-01-20 MED ORDER — POTASSIUM CHLORIDE CRYS ER 20 MEQ PO TBCR: 20.0000 meq | EXTENDED_RELEASE_TABLET | Freq: Two times a day (BID) | ORAL | 0 refills | Status: DC

## 2022-01-20 MED ORDER — SODIUM CHLORIDE 0.9% FLUSH
10.0000 mL | Freq: Once | INTRAVENOUS | Status: AC
  Administered 2022-01-20: 10 mL via INTRAVENOUS
  Filled 2022-01-20: qty 10

## 2022-01-20 MED ORDER — HEPARIN SOD (PORK) LOCK FLUSH 100 UNIT/ML IV SOLN
500.0000 [IU] | Freq: Once | INTRAVENOUS | Status: AC
  Administered 2022-01-20: 500 [IU] via INTRAVENOUS
  Filled 2022-01-20: qty 5

## 2022-01-20 NOTE — Progress Notes (Signed)
[place] CONSULT NOTE  Patient Care Team: Pcp, No as PCP - General Linda Hedges, CNM as PCP - OBGYN (Certified Nurse Midwife) Clent Jacks, RN as Oncology Nurse Navigator  REFERRING PROVIDER: Dr. Fransisca Connors   REASON FOR REFFERAL: newly diagnosed cervical cancer   CANCER STAGING   Cancer Staging  Cervical cancer, FIGO stage IB ([place]) Staging form: Cervix Uteri, AJCC Version 9 - Clinical stage from 11/15/2021: FIGO Stage IB3 (cT1b3, cN0, cM0) - Signed by Jane Canary, MD on 12/06/2021 Histopathologic type: Squamous cell carcinoma, NOS Stage prefix: Initial diagnosis  Current treatment -Pelvis IMRT started 12/26/2021 25 fractions planned -Weekly cisplatin 40 mg/m2 x5 cycles started 12/30/2021  ASSESSMENT & PLAN:  Kylie [name] 31 y.o. 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  -Started IMRT with Dr. Donella Stade on 12/26/2021.  Weekly cisplatin cycle 1 40 mg/m2 started on 12/30/2021.    - Patient is decoupled for treatments since she has time constraints to pick up kids from school. Seen today prior to cycle 4 of weekly cisplatin. K 2.9 and Mg 1.4. Supplements prescribed. Repeat levels on Monday but not to wait prior to starting cisplatin. CBC unremarkable.  With last cycle she had reaction to Emend which was discontinued from the treatment plan.  I added Decadron 4 mg twice daily for 3 days with cycle 3 which helped with nausea.  I will continue the same regimen for the remaining cycles.  Prescription  sent.  #Tinnitus  -2-3 times a week, transient in nature -I will closely monitor symptoms.  If it further worsens will change the chemotherapy to carboplatin.  #Sinus Bradycardia  - She had episodes of bradycardia with cycle 2.  EKG showed sinus bradycardia.  She was seen by cardiology and monitoring advised.  Could be vasovagal from nausea versus cisplatin.   - She saw Cardiology again yesterday and EKG showed HR 55. Planned for echo. HR today 81.   # Hypokalemia - likely from diarrhea. Kdur 20 meq bid x 7 days. Discussed about foods rich in K  #Hypomagnesemia - likely from diarrhea. MagOx 400 mg bid. Repeat levels on Monday   #Back pain -Occurs on initial days after chemotherapy.  Continue with Norco 5-325 mg twice daily as needed.  # Postpartum  - had C section on 8/25. Follows with Dr. Leafy Ro.   # Access - port 12/26/2021 due to difficult peripheral access  RTC Monday for cycle 4 of cisplatin. RTC in 1 week with NP, labs, cycle 5 cisplatin decoupled  Orders Placed This Encounter  Procedures   CBC with Differential    Standing Status:   Future    Number of Occurrences:   1    Standing Expiration Date:   37/62/8315   Basic metabolic panel    Standing Status:   Future    Number of Occurrences:   1    Standing Expiration Date:   01/24/2023   Magnesium    Standing Status:   Future    Number of Occurrences:   1    Standing  Expiration Date:   61/44/3154   Basic metabolic panel    Standing Status:   Future    Standing Expiration Date:   01/20/2023   Magnesium    Standing Status:   Future    Standing Expiration Date:   01/20/2023    The total time spent in the appointment was 40 minutes encounter with patients including review of chart and various tests results, discussions about plan of care and coordination of care plan   All questions were answered. The patient knows to call the clinic with any problems, questions or concerns. No barriers to learning was  detected.  Jane Canary, MD 10/27/20232:11 PM   HISTORY OF PRESENTING ILLNESS:  Kylie [name] 31 y.o. 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  Patient was seen today prior to cycle 4 of weekly cisplatin for toxicity check. This time she took Decadron 4 mg twice daily for 3 days which helped with nausea.  She noticed a decreased heart rate yesterday and was seen by cardiology.  EKG done in office showed heart rate of 55.  Echocardiogram .  She had diarrhea for 3 days after chemo for 3-4 episodes.  It is resolved now.  Denies any vomiting.  Denies neuropathy.  Denies fever and chills.  She has occasional tinnitus in both ears occurring 2-3 times a week which is very transient.      I have reviewed her chart and materials related to her cancer extensively and collaborated history with the patient. Summary of oncologic history is as follows: Oncology History  Cervical cancer, FIGO stage IB ([place])  11/08/2021 Initial Diagnosis   Patient [id number] 35w pregnant admitted on 11/11/2021 by Dr. Leafy Ro for cervical mass, preterm contractions, vaginal bleeding and discharge.  Sample of cervical mass were taken and sent to pathology.   11/15/2021 Cancer Staging   Staging form: Cervix Uteri, AJCC Version 9 - Clinical stage from 11/15/2021: FIGO Stage IB3 (cT1b3, cN0, cM0) - Signed by Jane Canary, MD on 12/06/2021 Histopathologic type: Squamous cell carcinoma, NOS Stage prefix: Initial diagnosis   11/15/2021 Pathology Results   DIAGNOSIS:  A.  CERVIX; BIOPSY:  - INVASIVE SQUAMOUS CELL CARCINOMA.  - SEE COMMENT.   Comment:  The biopsy fragments demonstrate moderately differentiated invasive squamous cell carcinoma.  Due to tangential sectioning the depth of invasion cannot be determined.    11/18/2021 Procedure   She delivered . S/p primary Classical Cesarean Section through Pfannenstiel incision; bilateral tubal  sterilization by salpingectomy; bilateral ovarian transposition into the paracolic gutters with hemoclips placed on the medial borders of the ovaries to identify during radiation   12/30/2021 -  Chemotherapy   Patient is on Treatment Plan : CERVICAL Cisplatin (40) q7d + XRT        MEDICAL HISTORY:  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, doing better now   Headache(784.0)    UTI (lower urinary tract infection)     SURGICAL HISTORY: Past Surgical History:  Procedure Laterality Date   CESAREAN SECTION WITH BILATERAL TUBAL LIGATION Bilateral 11/17/2021   Procedure: CLASSICAL CESAREAN SECTION WITH BILATERAL TUBAL LIGATION BY SALPINGECTOMY AND BILATERAL OOPHEROPEXY;  Surgeon: Benjaman Kindler, MD;  Location: [facility];  Service: Obstetrics;  Laterality: Bilateral;   HAND SURGERY Left    ORIF ELBOW FRACTURE Right 12/02/2012   Procedure: OPEN REDUCTION INTERNAL FIXATION (ORIF) ELBOW/OLECRANON  FRACTURE;  Surgeon: Schuyler Amor, MD;  Location: [place];  Service: Orthopedics;  Laterality: Right;   PORTACATH PLACEMENT N/A 12/26/2021   Procedure: INSERTION PORT-A-CATH;  Surgeon: Herbert Pun, MD;  Location: [facility];  Service: General;  Laterality: N/A;    SOCIAL HISTORY: Social History   Socioeconomic History   Marital status: Significant Other    Spouse name: Not on file   Number of children: Not on file   Years of education: Not on file   Highest education level: Not on file  Occupational History   Not on file  Tobacco Use   Smoking status: Former    Packs/day: 0.25    Years: 1.00    Total pack years: 0.25    Types: Cigarettes    Quit date: 03/28/2019    Years since quitting: 2.8   Smokeless tobacco: Never   Tobacco comments:    electronic- e-sigs-mostly  Vaping Use   Vaping Use: Former   Substances: Nicotine, THC, Flavoring  Substance and Sexual Activity   Alcohol use: Not Currently    Comment: rarely   Drug  use: Yes    Types: Marijuana    Comment: Delta 8 THC, Jul 25 2021   Sexual activity: Yes    Birth control/protection: None  Other Topics Concern   Not on file  Social History Narrative   Not on file   Social Determinants of Health   Financial Resource Strain: Not on file  Food Insecurity: Not on file  Transportation Needs: Not on file  Physical Activity: Not on file  Stress: Not on file  Social Connections: Not on file  Intimate Partner Violence: Not on file    FAMILY HISTORY: Family History  Problem Relation Age of Onset   Hyperlipidemia Father    Hypertension Father    Cancer Paternal Grandmother    Kidney disease Paternal Grandmother    Diabetes Paternal Grandfather    Cancer Paternal Aunt    Hearing loss Neg Hx     ALLERGIES:  is allergic to cat hair extract and emend [aprepitant].  MEDICATIONS:  Current Outpatient Medications  Medication Sig Dispense Refill   dexamethasone (DECADRON) 4 MG tablet Take 1 tablet (4 mg total) by mouth 2 (two) times daily with a meal for 3 days. 6 tablet 0   dexamethasone (DECADRON) 4 MG tablet Take 1 tablet (4 mg total) by mouth 2 (two) times daily with a meal for 9 days. Start on next day after chemotherapy 18 tablet 0   HYDROcodone-acetaminophen (NORCO) 5-325 MG tablet Take 1 tablet by mouth every 12 (twelve) hours as needed for up to 10 days for moderate pain. 20 tablet 0   lidocaine-prilocaine (EMLA) cream Apply 1 Application topically as needed. Apply to port 1 hour prior to chemotherapy, cover with plastic wrap 30 g 3   magnesium oxide (MAG-OX) 400 (240 Mg) MG tablet Take 1 tablet (400 mg total) by mouth 2 (two) times daily for 10 days. 20 tablet 0   nifedipine 0.3 % ointment Compounded with Lidocaine 2%, apply after each bowl movement     ondansetron (ZOFRAN) 8 MG tablet Take 1 tablet (8 mg total) by mouth every 8 (eight) hours as needed for nausea or vomiting. Take one tablet ('8mg'$  total) by mouth every 8hrs as needed. Start on the  third day after cisplatin chemotherapy. 30 tablet 1   potassium chloride SA (KLOR-CON M) 20 MEQ tablet Take 1 tablet (20 mEq total) by mouth 2 (two) times daily for  7 days. 14 tablet 0   prochlorperazine (COMPAZINE) 10 MG tablet Take 1 tablet (10 mg total) by mouth every 6 (six) hours as needed for nausea or vomiting. 30 tablet 1   senna (SENOKOT) 8.6 MG TABS tablet Take 2 tablets (17.2 mg total) by mouth at bedtime as needed for up to 7 days (constipation). 14 tablet 0   No current facility-administered medications for this visit.    REVIEW OF SYSTEMS:   Pertinent information mentioned in HPI All other systems were reviewed with the patient and are negative.  PHYSICAL EXAMINATION: ECOG PERFORMANCE STATUS: 0 - Asymptomatic  Vitals:   01/20/22 1345  BP: 116/75  Pulse: 81  Resp: 20  Temp: 98.7 F (37.1 C)  SpO2: 100%     Filed Weights   01/20/22 1345  Weight: 195 lb 3.2 oz (88.5 kg)      GENERAL:alert, no distress and comfortable SKIN: skin color, texture, turgor are normal, no rashes or significant lesions EYES: normal, conjunctiva are pink and non-injected, sclera clear OROPHARYNX:no exudate, no erythema and lips, buccal mucosa, and tongue normal  NECK: supple, thyroid normal size, non-tender, without nodularity LYMPH:  no palpable lymphadenopathy in the cervical, axillary or inguinal LUNGS: clear to auscultation and percussion with normal breathing effort HEART: regular rate & rhythm and no murmurs and no lower extremity edema ABDOMEN:abdomen soft, non-tender and normal bowel sounds Musculoskeletal:no cyanosis of digits and no clubbing  PSYCH: alert & oriented x 3 with fluent speech NEURO: no focal motor/sensory deficits  LABORATORY DATA:  I have reviewed the data as listed Lab Results  Component Value Date   WBC 5.0 01/20/2022   HGB 9.9 (L) 01/20/2022   HCT 29.7 (L) 01/20/2022   MCV 85.6 01/20/2022   PLT 174 01/20/2022   Recent Labs    12/22/21 1322  12/30/21 0846 01/05/22 1012 01/09/22 1038 01/13/22 0919 01/20/22 1326  NA 138 137   < > 135 136 138  K 3.8 3.5   < > 3.8 3.5 2.9*  CL 106 103   < > 105 103 105  CO2 28 26   < > '25 25 25  '$ GLUCOSE 89 116*   < > 86 84 98  BUN 9 10   < > '15 8 11  '$ CREATININE 0.96 0.89   < > 0.88 0.82 0.93  CALCIUM 8.6* 8.9   < > 8.4* 8.7* 8.0*  GFRNONAA >60 >60   < > >60 >60 >60  PROT 7.5 7.0  --  6.9  --   --   ALBUMIN 3.4* 3.3*  --  3.4*  --   --   AST 15 18  --  18  --   --   ALT 11 16  --  22  --   --   ALKPHOS 95 89  --  70  --   --   BILITOT 0.5 0.8  --  0.6  --   --    < > = values in this interval not displayed.    RADIOGRAPHIC STUDIES: I have personally reviewed the radiological images as listed and agreed with the findings in the report. DG Chest Port 1 View  Result Date: 12/26/2021 CLINICAL DATA:  Port-A-Cath in place. EXAM: PORTABLE CHEST 1 VIEW COMPARISON:  September 18, 2020 FINDINGS: A Port-A-Cath is in good position. The distal tip terminates in the central SVC. No pneumothorax. The heart, hila, and mediastinum are unremarkable. No pulmonary nodules or masses. No suspicious infiltrates. Mild atelectasis in  the bases. IMPRESSION: The right Port-A-Cath is in good position as above. No pneumothorax. No acute abnormalities. Mild atelectasis in the bases. Electronically Signed   By: Dorise Bullion III M.D.   On: 12/26/2021 09:04   DG C-Arm 1-60 Min-No Report  Result Date: 12/26/2021 Fluoroscopy was utilized by the requesting physician.  No radiographic interpretation.

## 2022-01-20 NOTE — Telephone Encounter (Signed)
Spoke with pt to communicate msg that Lab,MD, INF can be on the same day if it was convenient for her per previous in basket msg. Pt statets that they are fine the way they are and was just overwhelmed this am.

## 2022-01-20 NOTE — Telephone Encounter (Signed)
spoke with pt, she agreed to come in later today for missed appts

## 2022-01-23 ENCOUNTER — Ambulatory Visit: Payer: Medicaid Other | Admitting: Internal Medicine

## 2022-01-23 ENCOUNTER — Other Ambulatory Visit: Payer: Self-pay

## 2022-01-23 ENCOUNTER — Inpatient Hospital Stay: Payer: Medicaid Other

## 2022-01-23 ENCOUNTER — Ambulatory Visit
Admission: RE | Admit: 2022-01-23 | Discharge: 2022-01-23 | Disposition: A | Discharge status: Home / Self Care | Payer: Medicaid Other | Source: Ambulatory Visit | Attending: Radiation Oncology | Admitting: Radiation Oncology

## 2022-01-23 ENCOUNTER — Other Ambulatory Visit: Payer: Medicaid Other

## 2022-01-23 VITALS — BP 132/75 | HR 65 | Temp 97.8°F | Resp 18 | Wt 191.5 lb

## 2022-01-23 DIAGNOSIS — E876 Hypokalemia: Secondary | ICD-10-CM

## 2022-01-23 DIAGNOSIS — C539 Malignant neoplasm of cervix uteri, unspecified: Secondary | ICD-10-CM

## 2022-01-23 DIAGNOSIS — Z5111 Encounter for antineoplastic chemotherapy: Secondary | ICD-10-CM | POA: Diagnosis not present

## 2022-01-23 LAB — RAD ONC ARIA SESSION SUMMARY
Course Elapsed Days: 28
Plan Fractions Treated to Date: 4
Plan Prescribed Dose Per Fraction: 2 Gy
Plan Total Fractions Prescribed: 12
Plan Total Prescribed Dose: 24 Gy
Reference Point Dosage Given to Date: 34 Gy
Reference Point Session Dosage Given: 2 Gy
Session Number: 17

## 2022-01-23 LAB — BASIC METABOLIC PANEL
Anion gap: 5 (ref 5–15)
BUN: 25 mg/dL — ABNORMAL HIGH (ref 6–20)
CO2: 24 mmol/L (ref 22–32)
Calcium: 8.6 mg/dL — ABNORMAL LOW (ref 8.9–10.3)
Chloride: 106 mmol/L (ref 98–111)
Creatinine, Ser: 1 mg/dL (ref 0.44–1.00)
GFR, Estimated: >60 mL/min (ref >60)
Glucose, Bld: 87 mg/dL (ref 70–99)
Potassium: 3.9 mmol/L (ref 3.5–5.1)
Sodium: 135 mmol/L (ref 135–145)

## 2022-01-23 LAB — MAGNESIUM: Magnesium: 2 mg/dL (ref 1.7–2.4)

## 2022-01-23 MED ORDER — HEPARIN SOD (PORK) LOCK FLUSH 100 UNIT/ML IV SOLN
INTRAVENOUS | Status: AC
  Administered 2022-01-23: 500 [IU]
  Filled 2022-01-23: qty 5

## 2022-01-23 MED ORDER — SODIUM CHLORIDE 0.9 % IV SOLN
40.0000 mg/m2 | Freq: Once | INTRAVENOUS | Status: AC
  Administered 2022-01-23: 78 mg via INTRAVENOUS
  Filled 2022-01-23: qty 78

## 2022-01-23 MED ORDER — HEPARIN SOD (PORK) LOCK FLUSH 100 UNIT/ML IV SOLN
500.0000 [IU] | Freq: Once | INTRAVENOUS | Status: AC | PRN
  Filled 2022-01-23: qty 5

## 2022-01-23 MED ORDER — SODIUM CHLORIDE 0.9 % IV SOLN
10.0000 mg | Freq: Once | INTRAVENOUS | Status: AC
  Administered 2022-01-23: 10 mg via INTRAVENOUS
  Filled 2022-01-23: qty 10

## 2022-01-23 MED ORDER — POTASSIUM CHLORIDE IN NACL 20-0.9 MEQ/L-% IV SOLN
Freq: Once | INTRAVENOUS | Status: AC
  Filled 2022-01-23: qty 1000

## 2022-01-23 MED ORDER — SODIUM CHLORIDE 0.9 % IV SOLN
Freq: Once | INTRAVENOUS | Status: AC
  Filled 2022-01-23: qty 250

## 2022-01-23 MED ORDER — PALONOSETRON HCL INJECTION 0.25 MG/5ML
0.2500 mg | Freq: Once | INTRAVENOUS | Status: AC
  Administered 2022-01-23: 0.25 mg via INTRAVENOUS
  Filled 2022-01-23: qty 5

## 2022-01-23 MED ORDER — MAGNESIUM SULFATE 2 GM/50ML IV SOLN
2.0000 g | Freq: Once | INTRAVENOUS | Status: AC
  Administered 2022-01-23: 2 g via INTRAVENOUS
  Filled 2022-01-23: qty 50

## 2022-01-23 NOTE — Patient Instructions (Signed)
[facility] [place]  Discharge Instructions: Thank you for choosing [facility] to provide your oncology and hematology care.  If you have a lab appointment with the Cancer Center, please go directly to the Cancer Center and check in at the registration area.  Wear comfortable clothing and clothing appropriate for easy access to any Portacath or PICC line.   We strive to give you quality time with your provider. You may need to reschedule your appointment if you arrive late (15 or more minutes).  Arriving late affects you and other patients whose appointments are after yours.  Also, if you miss three or more appointments without notifying the office, you may be dismissed from the clinic at the provider's discretion.      For prescription refill requests, have your pharmacy contact our office and allow 72 hours for refills to be completed.    Today you received the following chemotherapy and/or immunotherapy agents Cisplatin    To help prevent nausea and vomiting after your treatment, we encourage you to take your nausea medication as directed.  BELOW ARE SYMPTOMS THAT SHOULD BE REPORTED IMMEDIATELY: *FEVER GREATER THAN 100.4 F (38 C) OR HIGHER *CHILLS OR SWEATING *NAUSEA AND VOMITING THAT IS NOT CONTROLLED WITH YOUR NAUSEA MEDICATION *UNUSUAL SHORTNESS OF BREATH *UNUSUAL BRUISING OR BLEEDING *URINARY PROBLEMS (pain or burning when urinating, or frequent urination) *BOWEL PROBLEMS (unusual diarrhea, constipation, pain near the anus) TENDERNESS IN MOUTH AND THROAT WITH OR WITHOUT PRESENCE OF ULCERS (sore throat, sores in mouth, or a toothache) UNUSUAL RASH, SWELLING OR PAIN  UNUSUAL VAGINAL DISCHARGE OR ITCHING   Items with * indicate a potential emergency and should be followed up as soon as possible or go to the Emergency Department if any problems should occur.  Please show the CHEMOTHERAPY ALERT CARD or IMMUNOTHERAPY ALERT CARD at check-in to the  Emergency Department and triage nurse.  Should you have questions after your visit or need to cancel or reschedule your appointment, please contact [facility] [place]  [phone] and follow the prompts.  Office hours are 8:00 a.m. to 4:30 p.m. Monday - Friday. Please note that voicemails left after 4:00 p.m. may not be returned until the following business day.  We are closed weekends and major holidays. You have access to a nurse at all times for urgent questions. Please call the main number to the clinic [phone] and follow the prompts.  For any non-urgent questions, you may also contact your provider using MyChart. We now offer e-Visits for anyone 18 and older to request care online for non-urgent symptoms. For details visit [email or web address].   Also download the MyChart app! Go to the app store, search "MyChart", open the app, select [facility], and log in with your MyChart username and password.  Masks are optional in the cancer centers. If you would like for your care team to wear a mask while they are taking care of you, please let them know. For doctor visits, patients may have with them one support person who is at least 33 years old. At this time, visitors are not allowed in the infusion area.   

## 2022-01-24 ENCOUNTER — Ambulatory Visit
Admission: RE | Admit: 2022-01-24 | Discharge: 2022-01-24 | Disposition: A | Discharge status: Home / Self Care | Payer: Medicaid Other | Source: Ambulatory Visit | Attending: Radiation Oncology | Admitting: Radiation Oncology

## 2022-01-24 ENCOUNTER — Ambulatory Visit
Admission: RE | Admit: 2022-01-24 | Discharge: 2022-01-24 | Disposition: A | Discharge status: Home / Self Care | Payer: Medicaid Other | Source: Ambulatory Visit | Attending: Cardiology | Admitting: Cardiology

## 2022-01-24 ENCOUNTER — Other Ambulatory Visit: Payer: Self-pay

## 2022-01-24 DIAGNOSIS — T451X5A Adverse effect of antineoplastic and immunosuppressive drugs, initial encounter: Secondary | ICD-10-CM | POA: Diagnosis not present

## 2022-01-24 DIAGNOSIS — I340 Nonrheumatic mitral (valve) insufficiency: Secondary | ICD-10-CM | POA: Insufficient documentation

## 2022-01-24 DIAGNOSIS — Z8541 Personal history of malignant neoplasm of cervix uteri: Secondary | ICD-10-CM | POA: Insufficient documentation

## 2022-01-24 DIAGNOSIS — R001 Bradycardia, unspecified: Secondary | ICD-10-CM | POA: Insufficient documentation

## 2022-01-24 DIAGNOSIS — Z5111 Encounter for antineoplastic chemotherapy: Secondary | ICD-10-CM | POA: Diagnosis not present

## 2022-01-24 LAB — ECHOCARDIOGRAM COMPLETE
AR max vel: 2.28 cm2
AV Area VTI: 2.13 cm2
AV Area mean vel: 2.25 cm2
AV Mean grad: 4 mmHg
AV Peak grad: 7.4 mmHg
Ao pk vel: 1.36 m/s
Area-P 1/2: 3.06 cm2
S' Lateral: 3.4 cm

## 2022-01-24 LAB — RAD ONC ARIA SESSION SUMMARY
Course Elapsed Days: 29
Plan Fractions Treated to Date: 5
Plan Prescribed Dose Per Fraction: 2 Gy
Plan Total Fractions Prescribed: 12
Plan Total Prescribed Dose: 24 Gy
Reference Point Dosage Given to Date: 36 Gy
Reference Point Session Dosage Given: 2 Gy
Session Number: 18

## 2022-01-24 NOTE — Progress Notes (Signed)
*  PRELIMINARY RESULTS* Echocardiogram 2D Echocardiogram has been performed.  Kylie [name] 01/24/2022, 9:55 AM

## 2022-01-25 ENCOUNTER — Other Ambulatory Visit: Payer: Self-pay

## 2022-01-25 ENCOUNTER — Ambulatory Visit
Admission: RE | Admit: 2022-01-25 | Discharge: 2022-01-25 | Disposition: A | Discharge status: Home / Self Care | Payer: Medicaid Other | Source: Ambulatory Visit | Attending: Radiation Oncology | Admitting: Radiation Oncology

## 2022-01-25 DIAGNOSIS — Z87891 Personal history of nicotine dependence: Secondary | ICD-10-CM | POA: Diagnosis not present

## 2022-01-25 DIAGNOSIS — F1290 Cannabis use, unspecified, uncomplicated: Secondary | ICD-10-CM | POA: Insufficient documentation

## 2022-01-25 DIAGNOSIS — C539 Malignant neoplasm of cervix uteri, unspecified: Secondary | ICD-10-CM | POA: Insufficient documentation

## 2022-01-25 LAB — RAD ONC ARIA SESSION SUMMARY
Course Elapsed Days: 30
Plan Fractions Treated to Date: 6
Plan Prescribed Dose Per Fraction: 2 Gy
Plan Total Fractions Prescribed: 12
Plan Total Prescribed Dose: 24 Gy
Reference Point Dosage Given to Date: 38 Gy
Reference Point Session Dosage Given: 2 Gy
Session Number: 19

## 2022-01-26 ENCOUNTER — Other Ambulatory Visit: Payer: Self-pay

## 2022-01-26 ENCOUNTER — Other Ambulatory Visit: Payer: Self-pay | Admitting: *Deleted

## 2022-01-26 ENCOUNTER — Ambulatory Visit
Admission: RE | Admit: 2022-01-26 | Discharge: 2022-01-26 | Disposition: A | Discharge status: Home / Self Care | Payer: Medicaid Other | Source: Ambulatory Visit | Attending: Radiation Oncology | Admitting: Radiation Oncology

## 2022-01-26 DIAGNOSIS — C539 Malignant neoplasm of cervix uteri, unspecified: Secondary | ICD-10-CM | POA: Diagnosis not present

## 2022-01-26 DIAGNOSIS — E876 Hypokalemia: Secondary | ICD-10-CM

## 2022-01-26 LAB — RAD ONC ARIA SESSION SUMMARY
Course Elapsed Days: 31
Plan Fractions Treated to Date: 7
Plan Prescribed Dose Per Fraction: 2 Gy
Plan Total Fractions Prescribed: 12
Plan Total Prescribed Dose: 24 Gy
Reference Point Dosage Given to Date: 40 Gy
Reference Point Session Dosage Given: 2 Gy
Session Number: 20

## 2022-01-26 NOTE — Progress Notes (Signed)
Last HDR has been scheduled for 02/24/22. Follow up gyn oncology appointment has been arranged.

## 2022-01-27 ENCOUNTER — Encounter: Payer: Self-pay | Admitting: Internal Medicine

## 2022-01-27 ENCOUNTER — Other Ambulatory Visit: Payer: Self-pay

## 2022-01-27 ENCOUNTER — Encounter: Payer: Self-pay | Admitting: Medical Oncology

## 2022-01-27 ENCOUNTER — Inpatient Hospital Stay: Payer: Medicaid Other

## 2022-01-27 ENCOUNTER — Ambulatory Visit
Admission: RE | Admit: 2022-01-27 | Discharge: 2022-01-27 | Disposition: A | Discharge status: Home / Self Care | Payer: Medicaid Other | Source: Ambulatory Visit | Attending: Radiation Oncology | Admitting: Radiation Oncology

## 2022-01-27 ENCOUNTER — Inpatient Hospital Stay (HOSPITAL_BASED_OUTPATIENT_CLINIC_OR_DEPARTMENT_OTHER): Payer: Medicaid Other | Admitting: Medical Oncology

## 2022-01-27 VITALS — BP 134/92 | HR 73 | Temp 97.8°F | Resp 18 | Ht 64.0 in | Wt 191.0 lb

## 2022-01-27 DIAGNOSIS — R109 Unspecified abdominal pain: Secondary | ICD-10-CM | POA: Insufficient documentation

## 2022-01-27 DIAGNOSIS — Z7952 Long term (current) use of systemic steroids: Secondary | ICD-10-CM | POA: Insufficient documentation

## 2022-01-27 DIAGNOSIS — H9313 Tinnitus, bilateral: Secondary | ICD-10-CM

## 2022-01-27 DIAGNOSIS — H9113 Presbycusis, bilateral: Secondary | ICD-10-CM | POA: Diagnosis not present

## 2022-01-27 DIAGNOSIS — Z95828 Presence of other vascular implants and grafts: Secondary | ICD-10-CM

## 2022-01-27 DIAGNOSIS — R112 Nausea with vomiting, unspecified: Secondary | ICD-10-CM

## 2022-01-27 DIAGNOSIS — R001 Bradycardia, unspecified: Secondary | ICD-10-CM | POA: Insufficient documentation

## 2022-01-27 DIAGNOSIS — R110 Nausea: Secondary | ICD-10-CM | POA: Insufficient documentation

## 2022-01-27 DIAGNOSIS — E876 Hypokalemia: Secondary | ICD-10-CM

## 2022-01-27 DIAGNOSIS — C539 Malignant neoplasm of cervix uteri, unspecified: Secondary | ICD-10-CM | POA: Insufficient documentation

## 2022-01-27 DIAGNOSIS — Z5111 Encounter for antineoplastic chemotherapy: Secondary | ICD-10-CM | POA: Diagnosis not present

## 2022-01-27 DIAGNOSIS — M549 Dorsalgia, unspecified: Secondary | ICD-10-CM | POA: Insufficient documentation

## 2022-01-27 LAB — CBC WITH DIFFERENTIAL/PLATELET
Abs Immature Granulocytes: 0.02 10*3/uL (ref 0.00–0.07)
Basophils Absolute: 0 10*3/uL (ref 0.0–0.1)
Basophils Relative: 0 %
Eosinophils Absolute: 0 10*3/uL (ref 0.0–0.5)
Eosinophils Relative: 1 %
HCT: 35 % — ABNORMAL LOW (ref 36.0–46.0)
Hemoglobin: 11.2 g/dL — ABNORMAL LOW (ref 12.0–15.0)
Immature Granulocytes: 0 %
Lymphocytes Relative: 25 %
Lymphs Abs: 1.2 10*3/uL (ref 0.7–4.0)
MCH: 28.1 pg (ref 26.0–34.0)
MCHC: 32 g/dL (ref 30.0–36.0)
MCV: 87.7 fL (ref 80.0–100.0)
Monocytes Absolute: 0.5 10*3/uL (ref 0.1–1.0)
Monocytes Relative: 10 %
Neutro Abs: 3.1 10*3/uL (ref 1.7–7.7)
Neutrophils Relative %: 64 %
Platelets: 210 10*3/uL (ref 150–400)
RBC: 3.99 MIL/uL (ref 3.87–5.11)
RDW: 15.6 % — ABNORMAL HIGH (ref 11.5–15.5)
WBC: 4.9 10*3/uL (ref 4.0–10.5)
nRBC: 0 % (ref 0.0–0.2)

## 2022-01-27 LAB — RAD ONC ARIA SESSION SUMMARY
Course Elapsed Days: 32
Plan Fractions Treated to Date: 8
Plan Prescribed Dose Per Fraction: 2 Gy
Plan Total Fractions Prescribed: 12
Plan Total Prescribed Dose: 24 Gy
Reference Point Dosage Given to Date: 42 Gy
Reference Point Session Dosage Given: 2 Gy
Session Number: 21

## 2022-01-27 LAB — COMPREHENSIVE METABOLIC PANEL
ALT: 18 U/L (ref 0–44)
AST: 14 U/L — ABNORMAL LOW (ref 15–41)
Albumin: 3.9 g/dL (ref 3.5–5.0)
Alkaline Phosphatase: 76 U/L (ref 38–126)
Anion gap: 7 (ref 5–15)
BUN: 14 mg/dL (ref 6–20)
CO2: 26 mmol/L (ref 22–32)
Calcium: 8.9 mg/dL (ref 8.9–10.3)
Chloride: 102 mmol/L (ref 98–111)
Creatinine, Ser: 0.71 mg/dL (ref 0.44–1.00)
GFR, Estimated: >60 mL/min (ref >60)
Glucose, Bld: 91 mg/dL (ref 70–99)
Potassium: 4.1 mmol/L (ref 3.5–5.1)
Sodium: 135 mmol/L (ref 135–145)
Total Bilirubin: 0.5 mg/dL (ref 0.3–1.2)
Total Protein: 7.2 g/dL (ref 6.5–8.1)

## 2022-01-27 LAB — MAGNESIUM: Magnesium: 1.9 mg/dL (ref 1.7–2.4)

## 2022-01-27 MED ORDER — ALTEPLASE 2 MG IJ SOLR
2.0000 mg | Freq: Once | INTRAMUSCULAR | Status: AC
  Administered 2022-01-27: 2 mg
  Filled 2022-01-27: qty 2

## 2022-01-27 MED ORDER — HEPARIN SOD (PORK) LOCK FLUSH 100 UNIT/ML IV SOLN
500.0000 [IU] | Freq: Once | INTRAVENOUS | Status: AC
  Administered 2022-01-27: 500 [IU] via INTRAVENOUS
  Filled 2022-01-27: qty 5

## 2022-01-27 MED ORDER — SODIUM CHLORIDE 0.9% FLUSH
10.0000 mL | Freq: Once | INTRAVENOUS | Status: AC
  Administered 2022-01-27: 10 mL via INTRAVENOUS
  Filled 2022-01-27: qty 10

## 2022-01-27 NOTE — Progress Notes (Signed)
Blood return noted from port after Cathflo. Patient deaccessed and discharged
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
## 2022-01-30 ENCOUNTER — Ambulatory Visit: Payer: Medicaid Other

## 2022-01-30 ENCOUNTER — Ambulatory Visit
Admission: RE | Admit: 2022-01-30 | Discharge: 2022-01-30 | Disposition: A | Discharge status: Home / Self Care | Payer: Medicaid Other | Source: Ambulatory Visit | Attending: Radiation Oncology | Admitting: Radiation Oncology

## 2022-01-30 ENCOUNTER — Other Ambulatory Visit: Payer: Self-pay | Admitting: Oncology

## 2022-01-30 ENCOUNTER — Other Ambulatory Visit: Payer: Self-pay

## 2022-01-30 ENCOUNTER — Inpatient Hospital Stay: Payer: Medicaid Other

## 2022-01-30 VITALS — BP 133/57 | HR 87 | Temp 97.3°F | Resp 18

## 2022-01-30 DIAGNOSIS — C539 Malignant neoplasm of cervix uteri, unspecified: Secondary | ICD-10-CM

## 2022-01-30 LAB — RAD ONC ARIA SESSION SUMMARY
Course Elapsed Days: 35
Plan Fractions Treated to Date: 9
Plan Prescribed Dose Per Fraction: 2 Gy
Plan Total Fractions Prescribed: 12
Plan Total Prescribed Dose: 24 Gy
Reference Point Dosage Given to Date: 44 Gy
Reference Point Session Dosage Given: 2 Gy
Session Number: 22

## 2022-01-30 MED ORDER — SODIUM CHLORIDE 0.9 % IV SOLN
40.0000 mg/m2 | Freq: Once | INTRAVENOUS | Status: AC
  Administered 2022-01-30: 78 mg via INTRAVENOUS
  Filled 2022-01-30: qty 78

## 2022-01-30 MED ORDER — SODIUM CHLORIDE 0.9 % IV SOLN
10.0000 mg | Freq: Once | INTRAVENOUS | Status: AC
  Administered 2022-01-30: 10 mg via INTRAVENOUS
  Filled 2022-01-30: qty 1

## 2022-01-30 MED ORDER — PALONOSETRON HCL INJECTION 0.25 MG/5ML
0.2500 mg | Freq: Once | INTRAVENOUS | Status: AC
  Administered 2022-01-30: 0.25 mg via INTRAVENOUS
  Filled 2022-01-30: qty 5

## 2022-01-30 MED ORDER — SODIUM CHLORIDE 0.9% FLUSH
10.0000 mL | INTRAVENOUS | Status: AC | PRN
  Filled 2022-01-30: qty 10

## 2022-01-30 MED ORDER — POTASSIUM CHLORIDE IN NACL 20-0.9 MEQ/L-% IV SOLN
Freq: Once | INTRAVENOUS | Status: AC
  Filled 2022-01-30: qty 1000

## 2022-01-30 MED ORDER — MAGNESIUM SULFATE 2 GM/50ML IV SOLN
2.0000 g | Freq: Once | INTRAVENOUS | Status: AC
  Administered 2022-01-30: 2 g via INTRAVENOUS
  Filled 2022-01-30: qty 50

## 2022-01-30 MED ORDER — HEPARIN SOD (PORK) LOCK FLUSH 100 UNIT/ML IV SOLN
500.0000 [IU] | Freq: Once | INTRAVENOUS | Status: AC | PRN
  Filled 2022-01-30: qty 5

## 2022-01-30 NOTE — Progress Notes (Unsigned)
Cardiology Clinic Note   Patient Name: Kylie [name] Date of Encounter: 01/31/2022  Primary Care Provider:  Pcp, No Primary Cardiologist:  Kate Sable, MD  Patient Profile    31 year old female with a history of cervical cancer on chemotherapy and recent bradycardia, anxiety, depression, headaches, who is here today for follow-up.  Past Medical History    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, doing better now   Headache(784.0)    UTI (lower urinary tract infection)    Past Surgical History:  Procedure Laterality Date   CESAREAN SECTION WITH BILATERAL TUBAL LIGATION Bilateral 11/17/2021   Procedure: CLASSICAL CESAREAN SECTION WITH BILATERAL TUBAL LIGATION BY SALPINGECTOMY AND BILATERAL OOPHEROPEXY;  Surgeon: Benjaman Kindler, MD;  Location: [facility];  Service: Obstetrics;  Laterality: Bilateral;   HAND SURGERY Left    ORIF ELBOW FRACTURE Right 12/02/2012   Procedure: OPEN REDUCTION INTERNAL FIXATION (ORIF) ELBOW/OLECRANON FRACTURE;  Surgeon: Schuyler Amor, MD;  Location: [facility];  Service: Orthopedics;  Laterality: Right;   PORTACATH PLACEMENT N/A 12/26/2021   Procedure: INSERTION PORT-A-CATH;  Surgeon: Herbert Pun, MD;  Location: [facility];  Service: General;  Laterality: N/A;    Allergies  Allergies  Allergen Reactions   Cat Hair Extract Swelling   Emend [Aprepitant] Hives    Itching, Hives and burning sensation    History of Present Illness    Kylie [name] is a 31 year old female with a history of cervical cancer on chemotherapy who still had been episodes of bradycardia.  She was recently diagnosed with cervical cancer undergoing chemotherapy every Friday, 3 days prior she was feeling sick with nausea vomiting.  She was evaluated at the cancer center with a low pulse.  EKG showed sinus bradycardia with rate of 47.  She was previously seen in clinic 01/12/2022 by Dr. Garen Lah  after an episode of being evaluated with a consistent heart rate of in the 40s.  EKG done in clinic showed sinus rhythm with a rate of 60.  She was last seen in clinic 01/19/2022 after calling the nurse line several times that her pulse oximetry was picking up heart rates in the 30s and 40s.  She had noted that she had worsening bradycardia after she had undergone allergic reaction to her chemo during her third treatment earlier in the week.  Upon looking up side effects of her chemo her cisplatin has a common side effect of bradycardia.  She was scheduled for an echocardiogram for baseline function from being on chemo and to make sure there was no structural abnormalities noted.  Echocardiogram completed 01/14/2022 revealed LVEF of 55-60%, no regional wall motion abnormalities, average left ventricular global longitudinal strain was -23.4%, there is mild to moderate mitral valve regurgitation.  She returns to clinic today stating that she has been doing well.  She has been monitoring her heart rate at home and her heart rate has been hanging around in the 60s.  She does have 1 last chemo treatment that is upcoming.  She denies any chest pain, shortness of breath, or palpitations.  She also denies any recent hospitalizations or visits to the emergency department.  Home Medications    Current Outpatient Medications  Medication Sig Dispense Refill   lidocaine-prilocaine (EMLA) cream Apply 1 Application topically as needed. Apply to port 1 hour prior to chemotherapy, cover with plastic wrap 30 g 3   nifedipine 0.3 % ointment  Compounded with Lidocaine 2%, apply after each bowl movement     ondansetron (ZOFRAN) 8 MG tablet Take 1 tablet (8 mg total) by mouth every 8 (eight) hours as needed for nausea or vomiting. Take one tablet ('8mg'$  total) by mouth every 8hrs as needed. Start on the third day after cisplatin chemotherapy. 30 tablet 1   predniSONE (DELTASONE) 10 MG tablet Take 10 mg by mouth as directed.      prochlorperazine (COMPAZINE) 10 MG tablet Take 1 tablet (10 mg total) by mouth every 6 (six) hours as needed for nausea or vomiting. 30 tablet 1   No current facility-administered medications for this visit.   Facility-Administered Medications Ordered in Other Visits  Medication Dose Route Frequency Provider Last Rate Last Admin   heparin lock flush 100 unit/mL  500 Units Intracatheter Once PRN Lloyd Huger, MD       sodium chloride flush (NS) 0.9 % injection 10 mL  10 mL Intracatheter PRN Grayland Ormond Kathlene November, MD         Family History    Family History  Problem Relation Age of Onset   Hyperlipidemia Father    Hypertension Father    Cancer Paternal Grandmother    Kidney disease Paternal Grandmother    Diabetes Paternal Grandfather    Cancer Paternal Aunt    Hearing loss Neg Hx    She indicated that the status of her father is unknown. She indicated that the status of her paternal grandmother is unknown. She indicated that the status of her paternal grandfather is unknown. She indicated that the status of her paternal aunt is unknown. She indicated that the status of her neg hx is unknown.  Social History    Social History   Socioeconomic History   Marital status: Significant Other    Spouse name: Not on file   Number of children: Not on file   Years of education: Not on file   Highest education level: Not on file  Occupational History   Not on file  Tobacco Use   Smoking status: Former    Packs/day: 0.25    Years: 1.00    Total pack years: 0.25    Types: Cigarettes    Quit date: 03/28/2019    Years since quitting: 2.8   Smokeless tobacco: Never   Tobacco comments:    electronic- e-sigs-mostly  Vaping Use   Vaping Use: Former   Substances: Nicotine, THC, Flavoring  Substance and Sexual Activity   Alcohol use: Not Currently    Comment: rarely   Drug use: Yes    Types: Marijuana    Comment: Delta 8 THC, Jul 25 2021   Sexual activity: Yes    Birth  control/protection: None  Other Topics Concern   Not on file  Social History Narrative   Not on file   Social Determinants of Health   Financial Resource Strain: Low Risk  (01/27/2022)   Overall Financial Resource Strain (CARDIA)    Difficulty of Paying Living Expenses: Not very hard  Food Insecurity: No Food Insecurity (01/27/2022)   Hunger Vital Sign    Worried About Running Out of Food in the Last Year: Never true    Ran Out of Food in the Last Year: Never true  Transportation Needs: No Transportation Needs (01/27/2022)   PRAPARE - Hydrologist (Medical): No    Lack of Transportation (Non-Medical): No  Physical Activity: Not on file  Stress: No Stress Concern Present (01/27/2022)  [place] Questionnaire    Feeling of Stress : Only a little  Social Connections: Unknown (01/27/2022)   Social Connection and Isolation Panel [NHANES]    Frequency of Communication with Friends and Family: More than three times a week    Frequency of Social Gatherings with Friends and Family: More than three times a week    Attends Religious Services: Not on file    Active Member of Clubs or Organizations: Not on file    Attends Archivist Meetings: Not on file    Marital Status: Living with partner  Intimate Partner Violence: Not At Risk (01/27/2022)   Humiliation, Afraid, Rape, and Kick questionnaire    Fear of Current or Ex-Partner: No    Emotionally Abused: No    Physically Abused: No    Sexually Abused: No     Review of Systems    General:  No chills, fever, night sweats or weight changes.  Endorses fatigue Cardiovascular:  No chest pain, dyspnea on exertion, edema, orthopnea, palpitations, paroxysmal nocturnal dyspnea. Dermatological: No rash, lesions/masses Respiratory: No cough, dyspnea Urologic: No hematuria, dysuria Abdominal:   No nausea, vomiting, diarrhea, bright red blood per rectum, melena,  or hematemesis Neurologic:  No visual changes, wkns, changes in mental status. All other systems reviewed and are otherwise negative except as noted above.   Physical Exam    VS:  BP 110/70 (BP Location: Left Arm, Patient Position: Sitting, Cuff Size: Normal)   Pulse 81   Ht '5\' 4"'$  (1.626 m)   Wt 194 lb 8 oz (88.2 kg)   SpO2 99%   BMI 33.39 kg/m  , BMI Body mass index is 33.39 kg/m.     GEN: Well nourished, well developed, in no acute distress. HEENT: normal. Neck: Supple, no JVD, carotid bruits, or masses. Cardiac: RRR, no murmurs, rubs, or gallops. No clubbing, cyanosis, edema.  Radials/DP/PT 2+ and equal bilaterally.  Respiratory:  Respirations regular and unlabored, clear to auscultation bilaterally. GI: Soft, nontender, nondistended, BS + x 4. MS: no deformity or atrophy. Skin: warm and dry, no rash. Neuro:  Strength and sensation are intact. Psych: Normal affect.  Accessory Clinical Findings    ECG personally reviewed by me today-No new tracing was completed today  Lab Results  Component Value Date   WBC 4.9 01/27/2022   HGB 11.2 (L) 01/27/2022   HCT 35.0 (L) 01/27/2022   MCV 87.7 01/27/2022   PLT 210 01/27/2022   Lab Results  Component Value Date   CREATININE 0.71 01/27/2022   BUN 14 01/27/2022   NA 135 01/27/2022   K 4.1 01/27/2022   CL 102 01/27/2022   CO2 26 01/27/2022   Lab Results  Component Value Date   ALT 18 01/27/2022   AST 14 (L) 01/27/2022   ALKPHOS 76 01/27/2022   BILITOT 0.5 01/27/2022   No results found for: "CHOL", "HDL", "LDLCALC", "LDLDIRECT", "TRIG", "CHOLHDL"  No results found for: "HGBA1C"  Assessment & Plan   1.  Previous episodes of sinus bradycardia and have resolved.  Patient has been monitoring her pulse with pulse oximeter at home and has been staying in the 60s.  Pulse today on exam is 81.  Patient has 1 more chemo treatment and has been advised the biggest side effect of her current H&H is bradycardia.  Echocardiogram  completed revealed LVEF 55-60%, no regional wall motion abnormalities, with mild to moderate mitral valve regurgitation.   2.  Continued side effects  from chemo that is continued to be managed per oncology  3.  Disposition patient return to clinic to see MD/APP in 3 months or sooner if needed.  Patient is also encouraged to continue to monitor her pulse with upcoming last chemo treatment.  Sevrin Sally, NP 01/31/2022, 9:09 AM

## 2022-01-30 NOTE — Patient Instructions (Signed)
[facility] AT [place]  Discharge Instructions: Thank you for choosing [facility] to provide your oncology and hematology care.  If you have a lab appointment with the [facility], please go directly to the [facility] and check in at the registration area.  Wear comfortable clothing and clothing appropriate for easy access to any Portacath or PICC line.   We strive to give you quality time with your provider. You may need to reschedule your appointment if you arrive late (15 or more minutes).  Arriving late affects you and other patients whose appointments are after yours.  Also, if you miss three or more appointments without notifying the office, you may be dismissed from the clinic at the provider's discretion.      For prescription refill requests, have your pharmacy contact our office and allow 72 hours for refills to be completed.    Today you received the following chemotherapy and/or immunotherapy agents CISPLATIN      To help prevent nausea and vomiting after your treatment, we encourage you to take your nausea medication as directed.  BELOW ARE SYMPTOMS THAT SHOULD BE REPORTED IMMEDIATELY: *FEVER GREATER THAN 100.4 F (38 C) OR HIGHER *CHILLS OR SWEATING *NAUSEA AND VOMITING THAT IS NOT CONTROLLED WITH YOUR NAUSEA MEDICATION *UNUSUAL SHORTNESS OF BREATH *UNUSUAL BRUISING OR BLEEDING *URINARY PROBLEMS (pain or burning when urinating, or frequent urination) *BOWEL PROBLEMS (unusual diarrhea, constipation, pain near the anus) TENDERNESS IN MOUTH AND THROAT WITH OR WITHOUT PRESENCE OF ULCERS (sore throat, sores in mouth, or a toothache) UNUSUAL RASH, SWELLING OR PAIN  UNUSUAL VAGINAL DISCHARGE OR ITCHING   Items with * indicate a potential emergency and should be followed up as soon as possible or go to the Emergency Department if any problems should occur.  Please show the CHEMOTHERAPY ALERT CARD or IMMUNOTHERAPY ALERT CARD at check-in to  the Emergency Department and triage nurse.  Should you have questions after your visit or need to cancel or reschedule your appointment, please contact [facility] [place] AT [place]  [phone] and follow the prompts.  Office hours are 8:00 a.m. to 4:30 p.m. Monday - Friday. Please note that voicemails left after 4:00 p.m. may not be returned until the following business day.  We are closed weekends and major holidays. You have access to a nurse at all times for urgent questions. Please call the main number to the clinic [phone] and follow the prompts.  For any non-urgent questions, you may also contact your provider using MyChart. We now offer e-Visits for anyone 29 and older to request care online for non-urgent symptoms. For details visit [email or web address].   Also download the MyChart app! Go to the app store, search "MyChart", open the app, select [facility], and log in with your MyChart username and password.  Masks are optional in the cancer centers. If you would like for your care team to wear a mask while they are taking care of you, please let them know. For doctor visits, patients may have with them one support person who is at least 33 years old. At this time, visitors are not allowed in the infusion area.  Cisplatin Injection What is this medication? CISPLATIN (SIS pla tin) treats some types of cancer. It works by slowing down the growth of cancer cells. This medicine may be used for other purposes; ask your health care provider or pharmacist if you have questions. COMMON BRAND NAME(S): Platinol, Platinol -AQ What should I tell my care  team before I take this medication? They need to know if you have any of these conditions: Eye disease, vision problems Hearing problems Kidney disease Low blood counts, such as low white cells, platelets, or red blood cells Tingling of the fingers or toes, or other nerve disorder An unusual or allergic reaction to  cisplatin, carboplatin, oxaliplatin, other medications, foods, dyes, or preservatives If you or your partner are pregnant or trying to get pregnant Breast-feeding How should I use this medication? This medication is injected into a vein. It is given by your care team in a hospital or clinic setting. Talk to your care team about the use of this medication in children. Special care may be needed. Overdosage: If you think you have taken too much of this medicine contact a poison control center or emergency room at once. NOTE: This medicine is only for you. Do not share this medicine with others. What if I miss a dose? Keep appointments for follow-up doses. It is important not to miss your dose. Call your care team if you are unable to keep an appointment. What may interact with this medication? Do not take this medication with any of the following: Live virus vaccines This medication may also interact with the following: Certain antibiotics, such as amikacin, gentamicin, neomycin, polymyxin B, streptomycin, tobramycin, vancomycin Foscarnet This list may not describe all possible interactions. Give your health care provider a list of all the medicines, herbs, non-prescription drugs, or dietary supplements you use. Also tell them if you smoke, drink alcohol, or use illegal drugs. Some items may interact with your medicine. What should I watch for while using this medication? Your condition will be monitored carefully while you are receiving this medication. You may need blood work done while taking this medication. This medication may make you feel generally unwell. This is not uncommon, as chemotherapy can affect healthy cells as well as cancer cells. Report any side effects. Continue your course of treatment even though you feel ill unless your care team tells you to stop. This medication may increase your risk of getting an infection. Call your care team for advice if you get a fever, chills, sore  throat, or other symptoms of a cold or flu. Do not treat yourself. Try to avoid being around people who are sick. Avoid taking medications that contain aspirin, acetaminophen, ibuprofen, naproxen, or ketoprofen unless instructed by your care team. These medications may hide a fever. This medication may increase your risk to bruise or bleed. Call your care team if you notice any unusual bleeding. Be careful brushing or flossing your teeth or using a toothpick because you may get an infection or bleed more easily. If you have any dental work done, tell your dentist you are receiving this medication. Drink fluids as directed while you are taking this medication. This will help protect your kidneys. Call your care team if you get diarrhea. Do not treat yourself. Talk to your care team if you or your partner wish to become pregnant or think you might be pregnant. This medication can cause serious birth defects if taken during pregnancy and for 14 months after the last dose. A negative pregnancy test is required before starting this medication. A reliable form of contraception is recommended while taking this medication and for 14 months after the last dose. Talk to your care team about effective forms of contraception. Do not father a child while taking this medication and for 11 months after the last dose. Use a  condom during sex during this time period. Do not breast-feed while taking this medication. This medication may cause infertility. Talk to your care team if you are concerned about your fertility. What side effects may I notice from receiving this medication? Side effects that you should report to your care team as soon as possible: Allergic reactions--skin rash, itching, hives, swelling of the face, lips, tongue, or throat Eye pain, change in vision, vision loss Hearing loss, ringing in ears Infection--fever, chills, cough, sore throat, wounds that don't heal, pain or trouble when passing urine,  general feeling of discomfort or being unwell Kidney injury--decrease in the amount of urine, swelling of the ankles, hands, or feet Low red blood cell level--unusual weakness or fatigue, dizziness, headache, trouble breathing Painful swelling, warmth, or redness of the skin, blisters or sores at the infusion site Pain, tingling, or numbness in the hands or feet Unusual bruising or bleeding Side effects that usually do not require medical attention (report to your care team if they continue or are bothersome): Hair loss Nausea Vomiting This list may not describe all possible side effects. Call your doctor for medical advice about side effects. You may report side effects to FDA at 1-800-FDA-1088. Where should I keep my medication? This medication is given in a hospital or clinic. It will not be stored at home. NOTE: This sheet is a summary. It may not cover all possible information. If you have questions about this medicine, talk to your doctor, pharmacist, or health care provider.  2023 Elsevier/Gold Standard (2021-07-08 00:00:00)

## 2022-01-31 ENCOUNTER — Ambulatory Visit: Payer: Medicaid Other

## 2022-01-31 ENCOUNTER — Encounter: Payer: Self-pay | Admitting: Cardiology

## 2022-01-31 ENCOUNTER — Ambulatory Visit
Admission: RE | Admit: 2022-01-31 | Discharge: 2022-01-31 | Disposition: A | Discharge status: Home / Self Care | Payer: Medicaid Other | Source: Ambulatory Visit | Attending: Radiation Oncology | Admitting: Radiation Oncology

## 2022-01-31 ENCOUNTER — Ambulatory Visit: Payer: Medicaid Other | Attending: Cardiology | Admitting: Cardiology

## 2022-01-31 ENCOUNTER — Other Ambulatory Visit: Payer: Self-pay

## 2022-01-31 VITALS — BP 110/70 | HR 81 | Ht 64.0 in | Wt 194.5 lb

## 2022-01-31 DIAGNOSIS — R001 Bradycardia, unspecified: Secondary | ICD-10-CM

## 2022-01-31 DIAGNOSIS — C539 Malignant neoplasm of cervix uteri, unspecified: Secondary | ICD-10-CM | POA: Diagnosis not present

## 2022-01-31 DIAGNOSIS — R112 Nausea with vomiting, unspecified: Secondary | ICD-10-CM

## 2022-01-31 LAB — RAD ONC ARIA SESSION SUMMARY
Course Elapsed Days: 36
Plan Fractions Treated to Date: 10
Plan Prescribed Dose Per Fraction: 2 Gy
Plan Total Fractions Prescribed: 12
Plan Total Prescribed Dose: 24 Gy
Reference Point Dosage Given to Date: 46 Gy
Reference Point Session Dosage Given: 2 Gy
Session Number: 23

## 2022-01-31 NOTE — Patient Instructions (Signed)
Medication Instructions:  - Your physician recommends that you continue on your current medications as directed. Please refer to the Current Medication list given to you today.  *If you need a refill on your cardiac medications before your next appointment, please call your pharmacy*   Lab Work: - none ordered  If you have labs (blood work) drawn today and your tests are completely normal, you will receive your results only by: [facility] (if you have MyChart) OR A paper copy in the mail If you have any lab test that is abnormal or we need to change your treatment, we will call you to review the results.   Testing/Procedures: - none ordered   Follow-Up: At [facility], you and your health needs are our priority.  As part of our continuing mission to provide you with exceptional heart care, we have created designated Provider Care Teams.  These Care Teams include your primary Cardiologist (physician) and Advanced Practice Providers (APPs -  Physician Assistants and Nurse Practitioners) who all work together to provide you with the care you need, when you need it.  We recommend signing up for the patient portal called "MyChart".  Sign up information is provided on this After Visit Summary.  MyChart is used to connect with patients for Virtual Visits (Telemedicine).  Patients are able to view lab/test results, encounter notes, upcoming appointments, etc.  Non-urgent messages can be sent to your provider as well.   To learn more about what you can do with MyChart, go to [email or web address].    Your next appointment:   3 month(s)  The format for your next appointment:   In Person  Provider:   You may see Kate Sable, MD or one of the following Advanced Practice Providers on your designated Care Team:    Gerrie Nordmann, NP    Other Instructions N/a  Important Information About Sugar

## 2022-02-01 ENCOUNTER — Ambulatory Visit: Payer: Medicaid Other

## 2022-02-01 ENCOUNTER — Ambulatory Visit: Payer: Medicaid Other | Admitting: Cardiology

## 2022-02-02 ENCOUNTER — Ambulatory Visit
Admission: RE | Admit: 2022-02-02 | Discharge: 2022-02-02 | Disposition: A | Discharge status: Home / Self Care | Payer: Medicaid Other | Source: Ambulatory Visit | Attending: Radiation Oncology | Admitting: Radiation Oncology

## 2022-02-02 ENCOUNTER — Other Ambulatory Visit: Payer: Self-pay

## 2022-02-02 ENCOUNTER — Ambulatory Visit: Payer: Medicaid Other

## 2022-02-02 DIAGNOSIS — C539 Malignant neoplasm of cervix uteri, unspecified: Secondary | ICD-10-CM | POA: Diagnosis not present

## 2022-02-02 LAB — RAD ONC ARIA SESSION SUMMARY
Course Elapsed Days: 38
Plan Fractions Treated to Date: 11
Plan Prescribed Dose Per Fraction: 2 Gy
Plan Total Fractions Prescribed: 12
Plan Total Prescribed Dose: 24 Gy
Reference Point Dosage Given to Date: 48 Gy
Reference Point Session Dosage Given: 2 Gy
Session Number: 24

## 2022-02-03 ENCOUNTER — Inpatient Hospital Stay: Payer: Medicaid Other

## 2022-02-03 ENCOUNTER — Other Ambulatory Visit: Payer: Medicaid Other

## 2022-02-03 ENCOUNTER — Ambulatory Visit: Payer: Medicaid Other | Admitting: Internal Medicine

## 2022-02-03 ENCOUNTER — Ambulatory Visit
Admission: RE | Admit: 2022-02-03 | Discharge: 2022-02-03 | Disposition: A | Discharge status: Home / Self Care | Payer: Medicaid Other | Source: Ambulatory Visit | Attending: Radiation Oncology | Admitting: Radiation Oncology

## 2022-02-03 ENCOUNTER — Inpatient Hospital Stay (HOSPITAL_BASED_OUTPATIENT_CLINIC_OR_DEPARTMENT_OTHER): Payer: Medicaid Other | Admitting: Internal Medicine

## 2022-02-03 ENCOUNTER — Other Ambulatory Visit: Payer: Self-pay

## 2022-02-03 ENCOUNTER — Encounter: Payer: Self-pay | Admitting: Internal Medicine

## 2022-02-03 VITALS — BP 122/94 | HR 84 | Temp 97.8°F | Resp 20 | Wt 195.0 lb

## 2022-02-03 DIAGNOSIS — Z5111 Encounter for antineoplastic chemotherapy: Secondary | ICD-10-CM

## 2022-02-03 DIAGNOSIS — C539 Malignant neoplasm of cervix uteri, unspecified: Secondary | ICD-10-CM

## 2022-02-03 LAB — CBC WITH DIFFERENTIAL/PLATELET
Abs Immature Granulocytes: 0.01 10*3/uL (ref 0.00–0.07)
Basophils Absolute: 0 10*3/uL (ref 0.0–0.1)
Basophils Relative: 0 %
Eosinophils Absolute: 0 10*3/uL (ref 0.0–0.5)
Eosinophils Relative: 1 %
HCT: 32.7 % — ABNORMAL LOW (ref 36.0–46.0)
Hemoglobin: 10.6 g/dL — ABNORMAL LOW (ref 12.0–15.0)
Immature Granulocytes: 0 %
Lymphocytes Relative: 34 %
Lymphs Abs: 1.1 10*3/uL (ref 0.7–4.0)
MCH: 29.2 pg (ref 26.0–34.0)
MCHC: 32.4 g/dL (ref 30.0–36.0)
MCV: 90.1 fL (ref 80.0–100.0)
Monocytes Absolute: 0.2 10*3/uL (ref 0.1–1.0)
Monocytes Relative: 7 %
Neutro Abs: 1.8 10*3/uL (ref 1.7–7.7)
Neutrophils Relative %: 58 %
Platelets: 157 10*3/uL (ref 150–400)
RBC: 3.63 MIL/uL — ABNORMAL LOW (ref 3.87–5.11)
RDW: 17.2 % — ABNORMAL HIGH (ref 11.5–15.5)
WBC: 3.1 10*3/uL — ABNORMAL LOW (ref 4.0–10.5)
nRBC: 0 % (ref 0.0–0.2)

## 2022-02-03 LAB — RAD ONC ARIA SESSION SUMMARY
Course Elapsed Days: 39
Plan Fractions Treated to Date: 12
Plan Prescribed Dose Per Fraction: 2 Gy
Plan Total Fractions Prescribed: 12
Plan Total Prescribed Dose: 24 Gy
Reference Point Dosage Given to Date: 50 Gy
Reference Point Session Dosage Given: 2 Gy
Session Number: 25

## 2022-02-03 LAB — BASIC METABOLIC PANEL
Anion gap: 7 (ref 5–15)
BUN: 21 mg/dL — ABNORMAL HIGH (ref 6–20)
CO2: 27 mmol/L (ref 22–32)
Calcium: 8.7 mg/dL — ABNORMAL LOW (ref 8.9–10.3)
Chloride: 101 mmol/L (ref 98–111)
Creatinine, Ser: 0.83 mg/dL (ref 0.44–1.00)
GFR, Estimated: >60 mL/min (ref >60)
Glucose, Bld: 106 mg/dL — ABNORMAL HIGH (ref 70–99)
Potassium: 4.1 mmol/L (ref 3.5–5.1)
Sodium: 135 mmol/L (ref 135–145)

## 2022-02-03 LAB — MAGNESIUM: Magnesium: 1.8 mg/dL (ref 1.7–2.4)

## 2022-02-03 MED ORDER — HYDROCODONE-ACETAMINOPHEN 5-325 MG PO TABS: 1.0000 | ORAL_TABLET | Freq: Two times a day (BID) | ORAL | 0 refills | Status: AC | PRN

## 2022-02-03 NOTE — Progress Notes (Signed)
Patient has no concerns today. 
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
## 2022-02-06 ENCOUNTER — Telehealth: Payer: Self-pay | Admitting: Internal Medicine

## 2022-02-06 ENCOUNTER — Inpatient Hospital Stay: Payer: Medicaid Other

## 2022-02-06 ENCOUNTER — Telehealth: Payer: Self-pay

## 2022-02-06 ENCOUNTER — Encounter: Payer: Self-pay | Admitting: Internal Medicine

## 2022-02-06 DIAGNOSIS — C539 Malignant neoplasm of cervix uteri, unspecified: Secondary | ICD-10-CM

## 2022-02-06 NOTE — Addendum Note (Signed)
Addended byJane Canary on: 02/06/2022 09:15 AM   Modules accepted: Orders

## 2022-02-06 NOTE — Telephone Encounter (Signed)
Called patient advised her Dr. Darrall Dears recommends her to f/u with OB and get a urinalysis either here or there. Patient stated she will call us back if there are no available apts at the [facility] office & to give a sample  here

## 2022-02-06 NOTE — Telephone Encounter (Signed)
Patient left a message through our answering service that her car broke down and to cancel her infusion appt today, 02/06/2022. Team, Chemo infusion nurse, and pharmacy notified.

## 2022-02-08 MED FILL — Dexamethasone Sodium Phosphate Inj 100 MG/10ML: INTRAMUSCULAR | Qty: 1 | Status: AC

## 2022-02-09 ENCOUNTER — Other Ambulatory Visit: Payer: Medicaid Other

## 2022-02-09 ENCOUNTER — Inpatient Hospital Stay: Payer: Medicaid Other

## 2022-02-09 ENCOUNTER — Ambulatory Visit: Payer: Medicaid Other | Admitting: Internal Medicine

## 2022-02-09 VITALS — BP 110/73 | HR 98 | Temp 97.9°F | Resp 18

## 2022-02-09 DIAGNOSIS — C539 Malignant neoplasm of cervix uteri, unspecified: Secondary | ICD-10-CM | POA: Diagnosis not present

## 2022-02-09 MED ORDER — SODIUM CHLORIDE 0.9 % IV SOLN
Freq: Once | INTRAVENOUS | Status: AC
  Filled 2022-02-09: qty 250

## 2022-02-09 MED ORDER — HEPARIN SOD (PORK) LOCK FLUSH 100 UNIT/ML IV SOLN
500.0000 [IU] | Freq: Once | INTRAVENOUS | Status: AC | PRN
  Administered 2022-02-09: 500 [IU]
  Filled 2022-02-09: qty 5

## 2022-02-09 MED ORDER — POTASSIUM CHLORIDE IN NACL 20-0.9 MEQ/L-% IV SOLN
Freq: Once | INTRAVENOUS | Status: AC
  Filled 2022-02-09: qty 1000

## 2022-02-09 MED ORDER — SODIUM CHLORIDE 0.9 % IV SOLN
40.0000 mg/m2 | Freq: Once | INTRAVENOUS | Status: AC
  Administered 2022-02-09: 78 mg via INTRAVENOUS
  Filled 2022-02-09: qty 78

## 2022-02-09 MED ORDER — MAGNESIUM SULFATE 2 GM/50ML IV SOLN
2.0000 g | Freq: Once | INTRAVENOUS | Status: AC
  Administered 2022-02-09: 2 g via INTRAVENOUS
  Filled 2022-02-09: qty 50

## 2022-02-09 MED ORDER — SODIUM CHLORIDE 0.9 % IV SOLN
10.0000 mg | Freq: Once | INTRAVENOUS | Status: AC
  Administered 2022-02-09: 10 mg via INTRAVENOUS
  Filled 2022-02-09: qty 10

## 2022-02-09 MED ORDER — PALONOSETRON HCL INJECTION 0.25 MG/5ML
0.2500 mg | Freq: Once | INTRAVENOUS | Status: AC
  Administered 2022-02-09: 0.25 mg via INTRAVENOUS
  Filled 2022-02-09: qty 5

## 2022-02-09 NOTE — Patient Instructions (Addendum)
[facility] AT [place]  Discharge Instructions: Thank you for choosing [facility] to provide your oncology and hematology care.  If you have a lab appointment with the [facility], please go directly to the [facility] and check in at the registration area.  Wear comfortable clothing and clothing appropriate for easy access to any Portacath or PICC line.   We strive to give you quality time with your provider. You may need to reschedule your appointment if you arrive late (15 or more minutes).  Arriving late affects you and other patients whose appointments are after yours.  Also, if you miss three or more appointments without notifying the office, you may be dismissed from the clinic at the provider's discretion.      For prescription refill requests, have your pharmacy contact our office and allow 72 hours for refills to be completed.    Today you received the following chemotherapy and/or immunotherapy agents CISPLATIN      To help prevent nausea and vomiting after your treatment, we encourage you to take your nausea medication as directed.  BELOW ARE SYMPTOMS THAT SHOULD BE REPORTED IMMEDIATELY: *FEVER GREATER THAN 100.4 F (38 C) OR HIGHER *CHILLS OR SWEATING *NAUSEA AND VOMITING THAT IS NOT CONTROLLED WITH YOUR NAUSEA MEDICATION *UNUSUAL SHORTNESS OF BREATH *UNUSUAL BRUISING OR BLEEDING *URINARY PROBLEMS (pain or burning when urinating, or frequent urination) *BOWEL PROBLEMS (unusual diarrhea, constipation, pain near the anus) TENDERNESS IN MOUTH AND THROAT WITH OR WITHOUT PRESENCE OF ULCERS (sore throat, sores in mouth, or a toothache) UNUSUAL RASH, SWELLING OR PAIN  UNUSUAL VAGINAL DISCHARGE OR ITCHING   Items with * indicate a potential emergency and should be followed up as soon as possible or go to the Emergency Department if any problems should occur.  Please show the CHEMOTHERAPY ALERT CARD or IMMUNOTHERAPY ALERT CARD at check-in to  the Emergency Department and triage nurse.  Should you have questions after your visit or need to cancel or reschedule your appointment, please contact [facility] [place] [facility] [place]  [phone] and follow the prompts.  Office hours are 8:00 a.m. to 4:30 p.m. Monday - Friday. Please note that voicemails left after 4:00 p.m. may not be returned until the following business day.  We are closed weekends and major holidays. You have access to a nurse at all times for urgent questions. Please call the main number to the clinic [phone] and follow the prompts.  For any non-urgent questions, you may also contact your provider using MyChart. We now offer e-Visits for anyone 31 and older to request care online for non-urgent symptoms. For details visit [email or web address].   Also download the MyChart app! Go to the app store, search "MyChart", open the app, select [facility], and log in with your MyChart username and password.  Masks are optional in the cancer centers. If you would like for your care team to wear a mask while they are taking care of you, please let them know. For doctor visits, patients may have with them one support person who is at least 33 years old. At this time, visitors are not allowed in the infusion area.  Cisplatin Injection What is this medication? CISPLATIN (SIS pla tin) treats some types of cancer. It works by slowing down the growth of cancer cells. This medicine may be used for other purposes; ask your health care provider or pharmacist if you have questions. COMMON BRAND NAME(S): Platinol, Platinol -AQ What should I tell my care  team before I take this medication? They need to know if you have any of these conditions: Eye disease, vision problems Hearing problems Kidney disease Low blood counts, such as low white cells, platelets, or red blood cells Tingling of the fingers or toes, or other nerve disorder An unusual or allergic reaction to  cisplatin, carboplatin, oxaliplatin, other medications, foods, dyes, or preservatives If you or your partner are pregnant or trying to get pregnant Breast-feeding How should I use this medication? This medication is injected into a vein. It is given by your care team in a hospital or clinic setting. Talk to your care team about the use of this medication in children. Special care may be needed. Overdosage: If you think you have taken too much of this medicine contact a poison control center or emergency room at once. NOTE: This medicine is only for you. Do not share this medicine with others. What if I miss a dose? Keep appointments for follow-up doses. It is important not to miss your dose. Call your care team if you are unable to keep an appointment. What may interact with this medication? Do not take this medication with any of the following: Live virus vaccines This medication may also interact with the following: Certain antibiotics, such as amikacin, gentamicin, neomycin, polymyxin B, streptomycin, tobramycin, vancomycin Foscarnet This list may not describe all possible interactions. Give your health care provider a list of all the medicines, herbs, non-prescription drugs, or dietary supplements you use. Also tell them if you smoke, drink alcohol, or use illegal drugs. Some items may interact with your medicine. What should I watch for while using this medication? Your condition will be monitored carefully while you are receiving this medication. You may need blood work done while taking this medication. This medication may make you feel generally unwell. This is not uncommon, as chemotherapy can affect healthy cells as well as cancer cells. Report any side effects. Continue your course of treatment even though you feel ill unless your care team tells you to stop. This medication may increase your risk of getting an infection. Call your care team for advice if you get a fever, chills, sore  throat, or other symptoms of a cold or flu. Do not treat yourself. Try to avoid being around people who are sick. Avoid taking medications that contain aspirin, acetaminophen, ibuprofen, naproxen, or ketoprofen unless instructed by your care team. These medications may hide a fever. This medication may increase your risk to bruise or bleed. Call your care team if you notice any unusual bleeding. Be careful brushing or flossing your teeth or using a toothpick because you may get an infection or bleed more easily. If you have any dental work done, tell your dentist you are receiving this medication. Drink fluids as directed while you are taking this medication. This will help protect your kidneys. Call your care team if you get diarrhea. Do not treat yourself. Talk to your care team if you or your partner wish to become pregnant or think you might be pregnant. This medication can cause serious birth defects if taken during pregnancy and for 14 months after the last dose. A negative pregnancy test is required before starting this medication. A reliable form of contraception is recommended while taking this medication and for 14 months after the last dose. Talk to your care team about effective forms of contraception. Do not father a child while taking this medication and for 11 months after the last dose. Use a  condom during sex during this time period. Do not breast-feed while taking this medication. This medication may cause infertility. Talk to your care team if you are concerned about your fertility. What side effects may I notice from receiving this medication? Side effects that you should report to your care team as soon as possible: Allergic reactions--skin rash, itching, hives, swelling of the face, lips, tongue, or throat Eye pain, change in vision, vision loss Hearing loss, ringing in ears Infection--fever, chills, cough, sore throat, wounds that don't heal, pain or trouble when passing urine,  general feeling of discomfort or being unwell Kidney injury--decrease in the amount of urine, swelling of the ankles, hands, or feet Low red blood cell level--unusual weakness or fatigue, dizziness, headache, trouble breathing Painful swelling, warmth, or redness of the skin, blisters or sores at the infusion site Pain, tingling, or numbness in the hands or feet Unusual bruising or bleeding Side effects that usually do not require medical attention (report to your care team if they continue or are bothersome): Hair loss Nausea Vomiting This list may not describe all possible side effects. Call your doctor for medical advice about side effects. You may report side effects to FDA at 1-800-FDA-1088. Where should I keep my medication? This medication is given in a hospital or clinic. It will not be stored at home. NOTE: This sheet is a summary. It may not cover all possible information. If you have questions about this medicine, talk to your doctor, pharmacist, or health care provider.  2023 Elsevier/Gold Standard (2021-07-08 00:00:00)

## 2022-02-10 ENCOUNTER — Ambulatory Visit: Payer: Medicaid Other

## 2022-03-02 ENCOUNTER — Other Ambulatory Visit: Payer: Self-pay | Admitting: Internal Medicine

## 2022-03-02 DIAGNOSIS — C539 Malignant neoplasm of cervix uteri, unspecified: Secondary | ICD-10-CM

## 2022-03-02 NOTE — Progress Notes (Signed)
PET/CT stage ordered for first week of March 2024 to assess for treatment response.    Orders Placed This Encounter  Procedures   NM PET Image Restag (PS) Skull Base To Thigh

## 2022-03-13 ENCOUNTER — Encounter: Payer: Self-pay | Admitting: Radiation Oncology

## 2022-03-13 ENCOUNTER — Ambulatory Visit
Admission: RE | Admit: 2022-03-13 | Discharge: 2022-03-13 | Disposition: A | Discharge status: Home / Self Care | Payer: Medicaid Other | Source: Ambulatory Visit | Attending: Hematology and Oncology | Admitting: Hematology and Oncology

## 2022-03-13 VITALS — BP 121/70 | HR 89 | Temp 98.3°F | Resp 16 | Ht 64.0 in | Wt 193.9 lb

## 2022-03-13 DIAGNOSIS — C539 Malignant neoplasm of cervix uteri, unspecified: Secondary | ICD-10-CM | POA: Insufficient documentation

## 2022-03-13 NOTE — Progress Notes (Signed)
Radiation Oncology Follow up Note  Name: Kylie [name]   Date:   03/13/2022 MRN:  [id number] DOB: 1990-08-12    This 31 y.o. 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  She also received intracavitary radiation therapy brachytherapy at [facility] which she is also completed.  Seen today in routine follow-up she is doing well.  She specifically denies any increased lower urinary tract symptoms diarrhea or fatigue.  She has a PET CT scan coming up in the next month..  COMPLICATIONS OF TREATMENT: none  FOLLOW UP COMPLIANCE: keeps appointments   PHYSICAL EXAM:  BP 121/70 (BP Location: Right Arm, Patient Position: Sitting, Cuff Size: Normal)   Pulse 89   Temp 98.3 F (36.8 C) (Tympanic)   Resp 16   Ht '5\' 4"'$  (1.626 m) Comment: stated ht  Wt 193 lb 14.4 oz (88 kg)   BMI 33.28 kg/m  Well-developed well-nourished patient in NAD. HEENT reveals PERLA, EOMI, discs not visualized.  Oral cavity is clear. No oral mucosal lesions are identified. Neck is clear without evidence of cervical or supraclavicular adenopathy. Lungs are clear to A&P. Cardiac examination is essentially unremarkable with regular rate and rhythm without murmur rub or thrill. Abdomen is benign with no organomegaly or masses noted. Motor sensory and DTR levels are equal and symmetric in the upper and lower extremities. Cranial nerves II through XII are grossly intact. Proprioception is intact. No peripheral adenopathy or edema is identified. No motor or sensory levels are noted. Crude visual fields are within normal range.  RADIOLOGY RESULTS: No current films for review  PLAN: Present time patient is doing well 1 month out  from both external beam radiation therapy as well as vaginal brachytherapy at [facility].  Very low side effect profile.  She has a PET scan scheduled for next month.  I am setting up a follow-up appointment with GYN oncology after the PET scan.  I have asked to see her back in 3 to 4 months for follow-up.  Patient is to call with any concerns.  I would like to take this opportunity to thank you for allowing me to participate in the care of your patient.Noreene Filbert, MD

## 2022-03-15 ENCOUNTER — Telehealth: Payer: Self-pay | Admitting: *Deleted

## 2022-03-15 NOTE — Telephone Encounter (Signed)
Patient called with questions regarding hormone replacement. Per Dr. Darrall Dears patient should speak with OB/GYN. Patient verbalized understanding and states she will reach out to Dr. Leafy Ro.

## 2022-03-21 ENCOUNTER — Ambulatory Visit: Payer: Medicaid Other

## 2022-04-18 ENCOUNTER — Inpatient Hospital Stay: Payer: Medicaid Other | Attending: Radiation Oncology

## 2022-04-18 DIAGNOSIS — Z452 Encounter for adjustment and management of vascular access device: Secondary | ICD-10-CM | POA: Insufficient documentation

## 2022-04-18 DIAGNOSIS — Z95828 Presence of other vascular implants and grafts: Secondary | ICD-10-CM

## 2022-04-18 DIAGNOSIS — C539 Malignant neoplasm of cervix uteri, unspecified: Secondary | ICD-10-CM | POA: Insufficient documentation

## 2022-04-18 MED ORDER — SODIUM CHLORIDE 0.9% FLUSH
10.0000 mL | Freq: Once | INTRAVENOUS | Status: AC
  Administered 2022-04-18: 10 mL via INTRAVENOUS
  Filled 2022-04-18: qty 10

## 2022-04-18 MED ORDER — HEPARIN SOD (PORK) LOCK FLUSH 100 UNIT/ML IV SOLN
500.0000 [IU] | Freq: Once | INTRAVENOUS | Status: AC
  Administered 2022-04-18: 500 [IU] via INTRAVENOUS
  Filled 2022-04-18: qty 5

## 2022-05-08 NOTE — Progress Notes (Signed)
Cardiology Clinic Note   Patient Name: Kylie [name] Date of Encounter: 05/12/2022  Primary Care Provider:  Pcp, No Primary Cardiologist:  Kate Sable, MD  Patient Profile    32 year old female with history of cervical cancer on chemotherapy, anxiety, depression, headaches, who had asymptomatic bradycardia, who is here today to follow-up on previous episodes of bradycardia.  Past Medical History    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, doing better now   Headache(784.0)    UTI (lower urinary tract infection)    Past Surgical History:  Procedure Laterality Date   CESAREAN SECTION WITH BILATERAL TUBAL LIGATION Bilateral 11/17/2021   Procedure: CLASSICAL CESAREAN SECTION WITH BILATERAL TUBAL LIGATION BY SALPINGECTOMY AND BILATERAL OOPHEROPEXY;  Surgeon: Benjaman Kindler, MD;  Location: [facility] ORS;  Service: Obstetrics;  Laterality: Bilateral;   HAND SURGERY Left    ORIF ELBOW FRACTURE Right 12/02/2012   Procedure: OPEN REDUCTION INTERNAL FIXATION (ORIF) ELBOW/OLECRANON FRACTURE;  Surgeon: Schuyler Amor, MD;  Location: [facility];  Service: Orthopedics;  Laterality: Right;   PORTACATH PLACEMENT N/A 12/26/2021   Procedure: INSERTION PORT-A-CATH;  Surgeon: Herbert Pun, MD;  Location: [facility] ORS;  Service: General;  Laterality: N/A;    Allergies  Allergies  Allergen Reactions   Cat Hair Extract Swelling   Dust Mite Extract Swelling   Emend [Aprepitant] Hives    Itching, Hives and burning sensation   Fosaprepitant Hives    And tachycardia    History of Present Illness    Kylie [name] is a 32 year old female with a history of cervical cancer on chemotherapy, anxiety/depression, and a recent EKG that showed sinus bradycardia with a rate of 47.    She had an echocardiogram that was completed 01/14/2022 which revealed LVEF of 55 to 60%, no regional wall motion abnormalities, average left  ventricular global longitudinal strain was -23.4% there was mild to moderate mitral valve regurgitation.  She was last seen in clinic 01/31/2022 where she had been doing well.  She did continue to monitor her heart rate at home and been hanging in the 60s.  She had her last chemo treatment that was upcoming and denied any other associated symptoms.  She returns clinic today stating that she has been doing very well.  She is continue to monitor her heart rate at home with her pulse ox and has not had any bradycardic episodes.  She denies any chest pain, shortness of breath, fatigue, peripheral edema.  Denies any recent hospitalizations was to the emergency department.  Home Medications    Current Outpatient Medications  Medication Sig Dispense Refill   Multiple Vitamin (MULTI-VITAMIN) tablet Take 1 tablet by mouth daily.     lidocaine-prilocaine (EMLA) cream Apply 1 Application topically as needed. Apply to port 1 hour prior to chemotherapy, cover with plastic wrap (Patient not taking: Reported on 05/12/2022) 30 g 3   nifedipine 0.3 % ointment Compounded with Lidocaine 2%, apply after each bowl movement (Patient not taking: Reported on 05/12/2022)     No current facility-administered medications for this visit.   Facility-Administered Medications Ordered in Other Visits  Medication Dose Route Frequency Provider Last Rate Last Admin   heparin lock flush 100 unit/mL  500 Units Intracatheter Once PRN Lloyd Huger, MD       sodium chloride flush (NS) 0.9 % injection 10 mL  10 mL Intracatheter PRN Grayland Ormond Kathlene November, MD  Family History    Family History  Problem Relation Age of Onset   Hyperlipidemia Father    Hypertension Father    Cancer Paternal Aunt    Cancer Paternal Grandmother    Kidney disease Paternal Grandmother    Diabetes Paternal Grandfather    Hearing loss Neg Hx    She indicated that her mother is alive. She indicated that her father is deceased. She indicated  that the status of her paternal grandmother is unknown. She indicated that the status of her paternal grandfather is unknown. She indicated that the status of her paternal aunt is unknown. She indicated that the status of her neg hx is unknown.  Social History    Social History   Socioeconomic History   Marital status: Significant Other    Spouse name: Not on file   Number of children: Not on file   Years of education: Not on file   Highest education level: Not on file  Occupational History   Not on file  Tobacco Use   Smoking status: Former    Packs/day: 0.25    Years: 1.00    Total pack years: 0.25    Types: Cigarettes    Quit date: 03/28/2019    Years since quitting: 3.1   Smokeless tobacco: Never   Tobacco comments:    electronic- e-sigs-mostly  Vaping Use   Vaping Use: Some days   Substances: Nicotine, THC, Flavoring  Substance and Sexual Activity   Alcohol use: Not Currently    Comment: rarely   Drug use: Not Currently    Types: Marijuana    Comment: Delta 8 THC, Jul 25 2021   Sexual activity: Yes    Birth control/protection: None  Other Topics Concern   Not on file  Social History Narrative   Not on file   Social Determinants of Health   Financial Resource Strain: Low Risk  (01/27/2022)   Overall Financial Resource Strain (CARDIA)    Difficulty of Paying Living Expenses: Not very hard  Food Insecurity: No Food Insecurity (01/27/2022)   Hunger Vital Sign    Worried About Running Out of Food in the Last Year: Never true    Ran Out of Food in the Last Year: Never true  Transportation Needs: No Transportation Needs (01/27/2022)   PRAPARE - Hydrologist (Medical): No    Lack of Transportation (Non-Medical): No  Physical Activity: Not on file  Stress: No Stress Concern Present (01/27/2022)   [place]    Feeling of Stress : Only a little  Social Connections: Unknown  (01/27/2022)   Social Connection and Isolation Panel [NHANES]    Frequency of Communication with Friends and Family: More than three times a week    Frequency of Social Gatherings with Friends and Family: More than three times a week    Attends Religious Services: Not on file    Active Member of Clubs or Organizations: Not on file    Attends Archivist Meetings: Not on file    Marital Status: Living with partner  Intimate Partner Violence: Not At Risk (01/27/2022)   Humiliation, Afraid, Rape, and Kick questionnaire    Fear of Current or Ex-Partner: No    Emotionally Abused: No    Physically Abused: No    Sexually Abused: No     Review of Systems    General:  No chills, fever, night sweats or weight changes.  Cardiovascular:  No chest pain, dyspnea on exertion, edema, orthopnea, palpitations, paroxysmal nocturnal dyspnea. Dermatological: No rash, lesions/masses Respiratory: No cough, dyspnea Urologic: No hematuria, dysuria Abdominal:   No nausea, vomiting, diarrhea, bright red blood per rectum, melena, or hematemesis Neurologic:  No visual changes, wkns, changes in mental status. All other systems reviewed and are otherwise negative except as noted above.   Physical Exam    VS:  BP 124/60 (BP Location: Left Arm, Patient Position: Sitting, Cuff Size: Normal)   Pulse 79   Ht 5' 4"$  (1.626 m)   Wt 201 lb 8 oz (91.4 kg)   SpO2 98%   BMI 34.59 kg/m  , BMI Body mass index is 34.59 kg/m.     GEN: Well nourished, well developed, in no acute distress. HEENT: normal. Neck: Supple, no JVD, carotid bruits, or masses. Cardiac: RRR, no murmurs, rubs, or gallops. No clubbing, cyanosis, edema.  Radials 2+/PT 2+ and equal bilaterally.  Respiratory:  Respirations regular and unlabored, clear to auscultation bilaterally. GI: Soft, nontender, nondistended, BS + x 4. MS: no deformity or atrophy. Skin: warm and dry, no rash. Neuro:  Strength and sensation are intact. Psych: Normal  affect.  Accessory Clinical Findings    ECG personally reviewed by me today-sinus rhythm with a rate of 79, nonspecific T wave- No acute changes  Lab Results  Component Value Date   WBC 3.1 (L) 02/03/2022   HGB 10.6 (L) 02/03/2022   HCT 32.7 (L) 02/03/2022   MCV 90.1 02/03/2022   PLT 157 02/03/2022   Lab Results  Component Value Date   CREATININE 0.83 02/03/2022   BUN 21 (H) 02/03/2022   NA 135 02/03/2022   K 4.1 02/03/2022   CL 101 02/03/2022   CO2 27 02/03/2022   Lab Results  Component Value Date   ALT 18 01/27/2022   AST 14 (L) 01/27/2022   ALKPHOS 76 01/27/2022   BILITOT 0.5 01/27/2022   No results found for: "CHOL", "HDL", "LDLCALC", "LDLDIRECT", "TRIG", "CHOLHDL"  No results found for: "HGBA1C"  Assessment & Plan   1.  Previous episodes of sinus bradycardia likely caused from chemotherapy currently she is maintaining sinus rhythm on EKG.  She continues to monitor her pulse on pulse oximeter at home and she has been stable in the 60s and 70s with no further incidence of sinus bradycardia that is been noted.  2.  Disposition patient return to clinic to see MD/APP in 8 months or sooner if needed.  Kyerra Vargo, NP 05/12/2022, 9:44 AM

## 2022-05-12 ENCOUNTER — Encounter: Payer: Self-pay | Admitting: Cardiology

## 2022-05-12 ENCOUNTER — Ambulatory Visit: Payer: Medicaid Other | Attending: Cardiology | Admitting: Cardiology

## 2022-05-12 VITALS — BP 124/60 | HR 79 | Ht 64.0 in | Wt 201.5 lb

## 2022-05-12 DIAGNOSIS — R001 Bradycardia, unspecified: Secondary | ICD-10-CM

## 2022-05-12 NOTE — Patient Instructions (Signed)
Medication Instructions:  No changes at this time.   *If you need a refill on your cardiac medications before your next appointment, please call your pharmacy*   Lab Work: None  If you have labs (blood work) drawn today and your tests are completely normal, you will receive your results only by: [place] (if you have MyChart) OR A paper copy in the mail If you have any lab test that is abnormal or we need to change your treatment, we will call you to review the results.   Testing/Procedures: None   Follow-Up: At [facility], you and your health needs are our priority.  As part of our continuing mission to provide you with exceptional heart care, we have created designated Provider Care Teams.  These Care Teams include your primary Cardiologist (physician) and Advanced Practice Providers (APPs -  Physician Assistants and Nurse Practitioners) who all work together to provide you with the care you need, when you need it.  Your next appointment:   8 month(s)  Provider:   Kate Sable, MD or Gerrie Nordmann, NP

## 2022-05-17 ENCOUNTER — Ambulatory Visit: Payer: Medicaid Other

## 2022-05-30 ENCOUNTER — Ambulatory Visit
Admission: RE | Admit: 2022-05-30 | Discharge: 2022-05-30 | Disposition: A | Discharge status: Home / Self Care | Payer: Medicaid Other | Source: Ambulatory Visit | Attending: Internal Medicine | Admitting: Internal Medicine

## 2022-05-30 ENCOUNTER — Encounter: Payer: Self-pay | Admitting: Radiology

## 2022-05-30 DIAGNOSIS — C539 Malignant neoplasm of cervix uteri, unspecified: Secondary | ICD-10-CM | POA: Insufficient documentation

## 2022-05-30 LAB — GLUCOSE, CAPILLARY: Glucose-Capillary: 102 mg/dL — ABNORMAL HIGH (ref 70–99)

## 2022-05-30 MED ORDER — FLUDEOXYGLUCOSE F - 18 (FDG) INJECTION
11.0400 | Freq: Once | INTRAVENOUS | Status: AC | PRN
  Administered 2022-05-30: 11.04 via INTRAVENOUS

## 2022-05-31 ENCOUNTER — Encounter: Payer: Self-pay | Admitting: Obstetrics and Gynecology

## 2022-05-31 ENCOUNTER — Inpatient Hospital Stay: Payer: Medicaid Other | Attending: Radiation Oncology | Admitting: Obstetrics and Gynecology

## 2022-05-31 VITALS — BP 112/72 | HR 81 | Temp 98.0°F | Resp 17 | Wt 214.0 lb

## 2022-05-31 DIAGNOSIS — Z9079 Acquired absence of other genital organ(s): Secondary | ICD-10-CM | POA: Diagnosis not present

## 2022-05-31 DIAGNOSIS — R9389 Abnormal findings on diagnostic imaging of other specified body structures: Secondary | ICD-10-CM | POA: Insufficient documentation

## 2022-05-31 DIAGNOSIS — C539 Malignant neoplasm of cervix uteri, unspecified: Secondary | ICD-10-CM

## 2022-05-31 DIAGNOSIS — Z9221 Personal history of antineoplastic chemotherapy: Secondary | ICD-10-CM | POA: Insufficient documentation

## 2022-05-31 DIAGNOSIS — Z923 Personal history of irradiation: Secondary | ICD-10-CM | POA: Diagnosis not present

## 2022-05-31 DIAGNOSIS — N951 Menopausal and female climacteric states: Secondary | ICD-10-CM | POA: Insufficient documentation

## 2022-05-31 DIAGNOSIS — E8941 Symptomatic postprocedural ovarian failure: Secondary | ICD-10-CM | POA: Diagnosis not present

## 2022-05-31 DIAGNOSIS — Z452 Encounter for adjustment and management of vascular access device: Secondary | ICD-10-CM | POA: Insufficient documentation

## 2022-05-31 DIAGNOSIS — Z79890 Hormone replacement therapy (postmenopausal): Secondary | ICD-10-CM | POA: Insufficient documentation

## 2022-05-31 NOTE — Progress Notes (Signed)
Patient here for GYN appointment, concerns of SOB & neuropathy since chemo
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
## 2022-05-31 NOTE — Progress Notes (Signed)
Survivorship Care Plan visit completed.  Treatment summary reviewed and given to patient.  ASCO answers booklet reviewed and given to patient.  CARE program and Cancer Transitions discussed with patient along with other resources cancer center offers to patients and caregivers.  Patient verbalized understanding.    

## 2022-06-05 ENCOUNTER — Inpatient Hospital Stay: Payer: Medicaid Other

## 2022-06-05 ENCOUNTER — Encounter: Payer: Self-pay | Admitting: Internal Medicine

## 2022-06-05 ENCOUNTER — Inpatient Hospital Stay (HOSPITAL_BASED_OUTPATIENT_CLINIC_OR_DEPARTMENT_OTHER): Payer: Medicaid Other | Admitting: Internal Medicine

## 2022-06-05 VITALS — BP 114/78 | HR 86 | Temp 97.8°F | Resp 16 | Wt 218.0 lb

## 2022-06-05 DIAGNOSIS — Z95828 Presence of other vascular implants and grafts: Secondary | ICD-10-CM

## 2022-06-05 DIAGNOSIS — C539 Malignant neoplasm of cervix uteri, unspecified: Secondary | ICD-10-CM

## 2022-06-05 MED ORDER — SODIUM CHLORIDE 0.9% FLUSH
10.0000 mL | INTRAVENOUS | Status: DC | PRN
  Administered 2022-06-05: 10 mL via INTRAVENOUS
  Filled 2022-06-05: qty 10

## 2022-06-05 MED ORDER — HEPARIN SOD (PORK) LOCK FLUSH 100 UNIT/ML IV SOLN
500.0000 [IU] | Freq: Once | INTRAVENOUS | Status: AC
  Administered 2022-06-05: 500 [IU] via INTRAVENOUS
  Filled 2022-06-05: qty 5

## 2022-06-05 NOTE — Progress Notes (Signed)
Concerns of being tired all of the  time.
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
## 2022-06-27 ENCOUNTER — Other Ambulatory Visit: Payer: Self-pay

## 2022-07-13 ENCOUNTER — Ambulatory Visit: Payer: Medicaid Other | Attending: Radiation Oncology | Admitting: Radiation Oncology

## 2022-07-14 ENCOUNTER — Ambulatory Visit: Payer: Medicaid Other | Attending: Cardiology | Admitting: Cardiology

## 2022-07-17 ENCOUNTER — Encounter: Payer: Self-pay | Admitting: Cardiology

## 2022-08-30 ENCOUNTER — Encounter: Payer: Self-pay | Admitting: Internal Medicine

## 2022-08-31 ENCOUNTER — Ambulatory Visit
Admission: RE | Admit: 2022-08-31 | Discharge: 2022-08-31 | Disposition: A | Discharge status: Home / Self Care | Payer: Medicaid Other | Source: Ambulatory Visit | Attending: Obstetrics and Gynecology | Admitting: Obstetrics and Gynecology

## 2022-08-31 DIAGNOSIS — C539 Malignant neoplasm of cervix uteri, unspecified: Secondary | ICD-10-CM

## 2022-08-31 DIAGNOSIS — I517 Cardiomegaly: Secondary | ICD-10-CM | POA: Insufficient documentation

## 2022-08-31 LAB — GLUCOSE, CAPILLARY: Glucose-Capillary: 105 mg/dL — ABNORMAL HIGH (ref 70–99)

## 2022-08-31 MED ORDER — FLUDEOXYGLUCOSE F - 18 (FDG) INJECTION
12.2900 | Freq: Once | INTRAVENOUS | Status: AC | PRN
  Administered 2022-08-31: 12.29 via INTRAVENOUS

## 2022-09-05 ENCOUNTER — Inpatient Hospital Stay: Payer: Medicaid Other | Attending: Radiation Oncology

## 2022-09-05 DIAGNOSIS — N951 Menopausal and female climacteric states: Secondary | ICD-10-CM | POA: Diagnosis not present

## 2022-09-05 DIAGNOSIS — Z452 Encounter for adjustment and management of vascular access device: Secondary | ICD-10-CM | POA: Diagnosis not present

## 2022-09-05 DIAGNOSIS — Z923 Personal history of irradiation: Secondary | ICD-10-CM | POA: Diagnosis not present

## 2022-09-05 DIAGNOSIS — Z9221 Personal history of antineoplastic chemotherapy: Secondary | ICD-10-CM | POA: Insufficient documentation

## 2022-09-05 DIAGNOSIS — Z8541 Personal history of malignant neoplasm of cervix uteri: Secondary | ICD-10-CM | POA: Diagnosis present

## 2022-09-05 DIAGNOSIS — Z08 Encounter for follow-up examination after completed treatment for malignant neoplasm: Secondary | ICD-10-CM | POA: Diagnosis present

## 2022-09-05 DIAGNOSIS — Z95828 Presence of other vascular implants and grafts: Secondary | ICD-10-CM

## 2022-09-05 DIAGNOSIS — Z79890 Hormone replacement therapy (postmenopausal): Secondary | ICD-10-CM | POA: Insufficient documentation

## 2022-09-05 MED ORDER — SODIUM CHLORIDE 0.9% FLUSH
10.0000 mL | INTRAVENOUS | Status: DC | PRN
  Administered 2022-09-05: 10 mL via INTRAVENOUS
  Filled 2022-09-05: qty 10

## 2022-09-05 MED ORDER — HEPARIN SOD (PORK) LOCK FLUSH 100 UNIT/ML IV SOLN
500.0000 [IU] | Freq: Once | INTRAVENOUS | Status: AC
  Administered 2022-09-05: 500 [IU] via INTRAVENOUS
  Filled 2022-09-05: qty 5

## 2022-09-05 NOTE — Patient Instructions (Signed)

## 2022-09-06 ENCOUNTER — Inpatient Hospital Stay (HOSPITAL_BASED_OUTPATIENT_CLINIC_OR_DEPARTMENT_OTHER): Payer: Medicaid Other | Admitting: Obstetrics and Gynecology

## 2022-09-06 VITALS — BP 112/77 | HR 84 | Temp 97.8°F | Resp 20 | Wt 210.4 lb

## 2022-09-06 DIAGNOSIS — Z8541 Personal history of malignant neoplasm of cervix uteri: Secondary | ICD-10-CM | POA: Diagnosis not present

## 2022-09-06 DIAGNOSIS — Z08 Encounter for follow-up examination after completed treatment for malignant neoplasm: Secondary | ICD-10-CM | POA: Diagnosis not present

## 2022-09-06 DIAGNOSIS — C539 Malignant neoplasm of cervix uteri, unspecified: Secondary | ICD-10-CM

## 2022-09-06 MED ORDER — CRYSELLE-28 0.3-30 MG-MCG PO TABS: ORAL_TABLET | ORAL | 11 refills | Status: DC
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
## 2022-09-07 ENCOUNTER — Other Ambulatory Visit: Payer: Self-pay

## 2022-12-06 ENCOUNTER — Ambulatory Visit
Admission: RE | Admit: 2022-12-06 | Discharge: 2022-12-06 | Disposition: A | Discharge status: Home / Self Care | Payer: Medicaid Other | Source: Ambulatory Visit | Attending: Nurse Practitioner | Admitting: Nurse Practitioner

## 2022-12-06 VITALS — Ht 64.0 in | Wt 208.0 lb

## 2022-12-06 DIAGNOSIS — C539 Malignant neoplasm of cervix uteri, unspecified: Secondary | ICD-10-CM | POA: Diagnosis present

## 2022-12-06 LAB — GLUCOSE, CAPILLARY: Glucose-Capillary: 99 mg/dL (ref 70–99)

## 2022-12-06 MED ORDER — FLUDEOXYGLUCOSE F - 18 (FDG) INJECTION
10.7000 | Freq: Once | INTRAVENOUS | Status: AC | PRN
  Administered 2022-12-06: 11.05 via INTRAVENOUS

## 2022-12-13 ENCOUNTER — Inpatient Hospital Stay: Payer: Medicaid Other

## 2022-12-14 ENCOUNTER — Other Ambulatory Visit: Payer: Self-pay

## 2022-12-18 ENCOUNTER — Encounter: Payer: Self-pay | Admitting: Internal Medicine

## 2022-12-27 ENCOUNTER — Encounter: Payer: Self-pay | Admitting: Obstetrics and Gynecology

## 2022-12-27 ENCOUNTER — Encounter: Payer: Self-pay | Admitting: Nurse Practitioner

## 2022-12-27 ENCOUNTER — Inpatient Hospital Stay (HOSPITAL_BASED_OUTPATIENT_CLINIC_OR_DEPARTMENT_OTHER): Payer: Medicaid Other | Admitting: Nurse Practitioner

## 2022-12-27 ENCOUNTER — Inpatient Hospital Stay: Payer: Medicaid Other | Attending: Internal Medicine | Admitting: Obstetrics and Gynecology

## 2022-12-27 VITALS — BP 101/75 | HR 70 | Temp 97.6°F | Resp 20 | Wt 206.3 lb

## 2022-12-27 DIAGNOSIS — Z923 Personal history of irradiation: Secondary | ICD-10-CM | POA: Diagnosis not present

## 2022-12-27 DIAGNOSIS — Z9221 Personal history of antineoplastic chemotherapy: Secondary | ICD-10-CM | POA: Diagnosis not present

## 2022-12-27 DIAGNOSIS — C539 Malignant neoplasm of cervix uteri, unspecified: Secondary | ICD-10-CM | POA: Diagnosis not present

## 2022-12-27 DIAGNOSIS — Z8541 Personal history of malignant neoplasm of cervix uteri: Secondary | ICD-10-CM | POA: Insufficient documentation

## 2022-12-27 DIAGNOSIS — N930 Postcoital and contact bleeding: Secondary | ICD-10-CM | POA: Diagnosis not present

## 2022-12-27 DIAGNOSIS — E28310 Symptomatic premature menopause: Secondary | ICD-10-CM

## 2022-12-27 DIAGNOSIS — Z9079 Acquired absence of other genital organ(s): Secondary | ICD-10-CM | POA: Insufficient documentation

## 2022-12-27 DIAGNOSIS — N941 Unspecified dyspareunia: Secondary | ICD-10-CM

## 2022-12-27 DIAGNOSIS — Z08 Encounter for follow-up examination after completed treatment for malignant neoplasm: Secondary | ICD-10-CM

## 2022-12-27 DIAGNOSIS — N898 Other specified noninflammatory disorders of vagina: Secondary | ICD-10-CM

## 2022-12-27 LAB — WET PREP, GENITAL
Clue Cells Wet Prep HPF POC: NONE SEEN
Sperm: NONE SEEN
Trich, Wet Prep: NONE SEEN
WBC, Wet Prep HPF POC: <10 (ref <10)
Yeast Wet Prep HPF POC: NONE SEEN

## 2022-12-27 MED ORDER — ESTRADIOL 0.05 MG/24HR TD PTTW: 1.0000 | MEDICATED_PATCH | TRANSDERMAL | 1 refills | Status: DC

## 2022-12-27 MED ORDER — ESTRADIOL 0.1 MG/GM VA CREA: 1.0000 | TOPICAL_CREAM | VAGINAL | 12 refills | Status: DC

## 2022-12-27 MED ORDER — PROGESTERONE 200 MG PO CAPS: ORAL_CAPSULE | ORAL | 1 refills | Status: DC

## 2022-12-27 NOTE — Progress Notes (Signed)
Sympt[facility] Management Clinic  [facility] at [facility] A Department of the [place]. [facility] [address] [place], [place] [address] [phone] (phone) [phone] (fax)  Patient Care Team: Pcp, No as PCP - General Haroldine Laws, CNM as PCP - OBGYN (Certified Nurse Midwife) Debbe Odea, MD as PCP - Cardiology (Cardiology) Benita Gutter, RN as Oncology Nurse Navigator Carmina Miller, MD as Consulting Physician (Radiation Oncology) Michaelyn Barter, MD as Consulting Physician (Oncology) Marinell Blight, Marcelyn Ditty, MD as Referring Physician (Radiation Oncology) Carolan Shiver, MD as Consulting Physician (General Surgery)   Name of the patient: Kylie [name]  [id number]  Apr 10, 1990   Date of visit: 12/27/22  Diagnosis- Cervical Cancer  Chief complaint/ Reason for visit- Menopausal symptoms & painful intercourse  Heme/Onc history:  Oncology History  Cervical cancer, FIGO stage IB (HCC)  11/08/2021 Initial Diagnosis   Patient [id number] 35w pregnant admitted on 11/11/2021 by Dr. Dalbert Garnet for cervical mass, preterm contractions, vaginal bleeding and discharge.  Sample of cervical mass were taken and sent to pathology.   11/15/2021 Cancer Staging   Staging form: Cervix Uteri, AJCC Version 9 - Clinical stage from 11/15/2021: FIGO Stage IB3 (cT1b3, cN0, cM0) - Signed by Michaelyn Barter, MD on 12/06/2021 Histopathologic type: Squamous cell carcinoma, NOS Stage prefix: Initial diagnosis   11/15/2021 Pathology Results   DIAGNOSIS:  A.  CERVIX; BIOPSY:  - INVASIVE SQUAMOUS CELL CARCINOMA.  - SEE COMMENT.   Comment:  The biopsy fragments demonstrate moderately differentiated invasive squamous cell carcinoma.  Due to tangential sectioning the depth of invasion cannot be determined.    11/18/2021 Procedure   She delivered . S/p primary Classical Cesarean Section through Pfannenstiel incision; bilateral tubal sterilization by  salpingectomy; bilateral ovarian transposition into the paracolic gutters with hemoclips placed on the medial borders of the ovaries to identify during radiation   12/30/2021 -  Chemotherapy   Patient is on Treatment Plan : CERVICAL Cisplatin (40) q7d + XRT       Interval history- Patient is 32 year old female with above history of cervical cancer s/p chemotherapy and radiation, who presents to clinic for complaints of symptoms of menopause and post coital bleeding and discomfort. She was previously taking HRT with OCP but has trouble remembering to take her medication and has essentially discontinued it. She reports increased aches and pains, fatigue, mood changes with irritability and brain fog. She initially denied problems with intercourse but more recently has had pain and spotting after sex. She saw gyn onc today for follow up. Pap was performed and Dr Johnnette Litter reported some inflammation of her cervix. She underwent a salpingectomy as part of her treatment, ovaries were transposed. 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 She's recently moved to [place] and has returned to work.   Review of systems- Review of Systems  Constitutional:  Positive for malaise/fatigue. Negative for chills, fever and weight loss.  HENT:  Negative for hearing loss, nosebleeds, sore throat and tinnitus.   Eyes:  Negative for blurred vision and double vision.  Respiratory:  Negative for cough, hemoptysis, shortness of breath and wheezing.   Cardiovascular:  Negative for chest pain, palpitations and leg swelling.  Gastrointestinal:  Negative for abdominal pain, blood in stool, constipation, diarrhea, melena, nausea and vomiting.  Genitourinary:  Negative for dysuria and urgency.  Musculoskeletal:  Positive for back pain and joint pain. Negative for falls and myalgias.  Skin:  Negative for itching and rash.  Neurological:  Positive for headaches.  Negative for dizziness, tingling, sensory change, loss of consciousness and  weakness.  Endo/Heme/Allergies:  Negative for environmental allergies. Does not bruise/bleed easily.  Psychiatric/Behavioral:  Positive for depression. The patient is nervous/anxious. The patient does not have insomnia.     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, doing better now   Headache(784.0)    UTI (lower urinary tract infection)    Past Surgical History:  Procedure Laterality Date   CESAREAN SECTION WITH BILATERAL TUBAL LIGATION Bilateral 11/17/2021   Procedure: CLASSICAL CESAREAN SECTION WITH BILATERAL TUBAL LIGATION BY SALPINGECTOMY AND BILATERAL OOPHEROPEXY;  Surgeon: Christeen Douglas, MD;  Location: [facility] ORS;  Service: Obstetrics;  Laterality: Bilateral;   HAND SURGERY Left    ORIF ELBOW FRACTURE Right 12/02/2012   Procedure: OPEN REDUCTION INTERNAL FIXATION (ORIF) ELBOW/OLECRANON FRACTURE;  Surgeon: Marlowe Shores, MD;  Location: [facility] OR;  Service: Orthopedics;  Laterality: Right;   PORTACATH PLACEMENT N/A 12/26/2021   Procedure: INSERTION PORT-A-CATH;  Surgeon: Carolan Shiver, MD;  Location: [facility] ORS;  Service: General;  Laterality: N/A;   Social History   Socioeconomic History   Marital status: Significant Other    Spouse name: Not on file   Number of children: Not on file   Years of education: Not on file   Highest education level: Not on file  Occupational History   Not on file  Tobacco Use   Smoking status: Former    Current packs/day: 0.00    Average packs/day: 0.3 packs/day for 1 year (0.3 ttl pk-yrs)    Types: Cigarettes    Start date: 03/27/2018    Quit date: 03/28/2019    Years since quitting: 3.7   Smokeless tobacco: Never   Tobacco comments:    electronic-  e-sigs-mostly  Vaping Use   Vaping status: Some Days   Substances: Nicotine, THC, Flavoring  Substance and Sexual Activity   Alcohol use: Not Currently    Comment: rarely   Drug use: Not Currently    Types: Marijuana    Comment: Delta 8 THC, Jul 25 2021   Sexual activity: Yes    Birth control/protection: None  Other Topics Concern   Not on file  Social History Narrative   Not on file   Social Determinants of Health   Financial Resource Strain: Low Risk  (01/27/2022)   Overall Financial Resource Strain (CARDIA)    Difficulty of Paying Living Expenses: Not very hard  Food Insecurity: No Food Insecurity (01/27/2022)   Hunger Vital Sign    Worried About Running Out of Food in the Last Year: Never true    Ran Out of Food in the Last Year: Never true  Transportation Needs: No Transportation Needs (01/27/2022)   PRAPARE - Administrator, Civil Service (Medical): No    Lack of Transportation (Non-Medical): No  Physical Activity: Not on file  Stress: No Stress Concern Present (01/27/2022)   [employer] of Occupational Health - Occupational Stress Questionnaire    Feeling of Stress : Only a little  Social Connections: Unknown (01/27/2022)   Social Connection and Isolation Panel [NHANES]    Frequency of Communication with Friends and Family: More than three  times a week    Frequency of Social Gatherings with Friends and Family: More than three times a week    Attends Religious Services: Not on file    Active Member of Clubs or Organizations: Not on file    Attends Banker Meetings: Not on file    Marital Status: Living with partner  Intimate Partner Violence: Not At Risk (01/27/2022)   Humiliation, Afraid, Rape, and Kick questionnaire    Fear of Current or Ex-Partner: No    Emotionally Abused: No    Physically Abused: No    Sexually Abused: No   Family History  Problem Relation Age of Onset   Hyperlipidemia Father    Hypertension Father    Cancer  Paternal Aunt    Cancer Paternal Grandmother    Kidney disease Paternal Grandmother    Diabetes Paternal Grandfather    Hearing loss Neg Hx     Current Outpatient Medications:    lidocaine-prilocaine (EMLA) cream, Apply 1 Application topically as needed. Apply to port 1 hour prior to chemotherapy, cover with plastic wrap (Patient not taking: Reported on 05/12/2022), Disp: 30 g, Rfl: 3   Multiple Vitamin (MULTI-VITAMIN) tablet, Take 1 tablet by mouth daily. (Patient not taking: Reported on 05/31/2022), Disp: , Rfl:    nifedipine 0.3 % ointment, Compounded with Lidocaine 2%, apply after each bowl movement (Patient not taking: Reported on 05/12/2022), Disp: , Rfl:    norgestrel-ethinyl estradiol (CRYSELLE-28) 0.3-30 MG-MCG tablet, Take 21 active pills continuously. Discard 7 placebo pills. Take for hormone replacement (Patient not taking: Reported on 12/27/2022), Disp: 28 tablet, Rfl: 11 No current facility-administered medications for this visit.  Facility-Administered Medications Ordered in Other Visits:    heparin lock flush 100 unit/mL, 500 Units, Intracatheter, Once PRN, Orlie Dakin, Tollie Pizza, MD   sodium chloride flush (NS) 0.9 % injection 10 mL, 10 mL, Intracatheter, PRN, Orlie Dakin, Tollie Pizza, MD  Physical exam: There were no vitals filed for this visit. Physical Exam Constitutional:      Appearance: She is not ill-appearing.     Comments: Accompanied by significant other  Neurological:     Mental Status: She is alert and oriented to person, place, and time.  Psychiatric:        Mood and Affect: Mood normal.        Behavior: Behavior normal.     Assessment and plan- Patient is a 32 y.o. female with history of cervical cancer, post treatment menopause, who presents to clinic for complaints of:   Menopausal Symptoms- secondary to chemo & radiation despite transposition of ovaries during salpingectomy. Uterus in situ. Reviewed that she will need hormonal therapy for prevention of CAD, bone  loss, insulin resistance, GU & sexual dysfunction. Typically HRT is recommended into her 69s, possibly 60s, at which time she would have experienced natural menopause. She has a multitude of symptoms today which we discussed in detail and I suspect are a result of estrogen deficiency. May also be experiencing adjustment disorder as result of cancer diagnosis, treatment, child birth, moving, and returning to work. She was previously prescribed oral combined oral contraceptives for HRT but had poor compliance due to memory changes. Recommend trial of topical patch for estrogen replacement. Due to uterus in situ she will need add back with progestin. Typically with her conversion from oral OCP to transdermal I'd suspect that she would need a 0.1 mg patch. However, since she hasn't been taking anything, I'd prefer to start with 0.5 mg patch. Can increase to  0.075 mg if ongoing evidence of estrogen deficiency. Reviewed role of progesterone to help protect the lining of the uterus and prevent endometrial hyperplasia. She'll start progesterone 200 mg daily x 14 consecutive days a month. At follow up we'll assess for signs of progesterone sensitivity (irritability, mood changes, bloating, swelling, weight gain, constipation) vs deficiency. If low, increase to 200 mg daily. If excess, decrease to 100 mg daily or 100 mg x 14 days a month. If she is experiencing bloating, breast tenderness, bleeding, mood changes, she may be experiencing supratherapeutic estrogen levels and need reduction though less likely given her age and BMI. I'd expect her symptoms to start improving over next couple of months. Reviewed that we often have to adjust dose/route/formulation to find right regimen. If she experiences skin sensitivity to a patch I recommend spraying flonase to the skin, let dry, then apply patch.  Postcoital bleeding- suspect related to estrogen deficiency and atrophic changes post radiation. HRT should help but I'd also  recommend topical estrogen. Start estrace cream, 1 applicator full into vagina at night x 3 nights a week. She may also benefit from vaginal lubricants with sex.  Cervical cancer- case discussed with Dr Johnnette Litter who also saw patient today. He performed pap.   Disposition:  Follow up with virtual visit in 2 months.   Visit Diagnosis 1. Vaginal discharge   2. Cervical cancer, FIGO stage IB (HCC)    Patient expressed understanding and was in agreement with this plan. She also understands that She can call clinic at any time with any questions, concerns, or complaints.   Thank you for allowing me to participate in the care of this very pleasant patient.   Consuello Masse, DNP, AGNP-C, AOCNP [facility] [phone]
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
## 2022-12-28 ENCOUNTER — Other Ambulatory Visit: Payer: Self-pay

## 2023-01-01 LAB — GYN REPORT

## 2023-01-03 ENCOUNTER — Inpatient Hospital Stay (HOSPITAL_BASED_OUTPATIENT_CLINIC_OR_DEPARTMENT_OTHER): Payer: Medicaid Other | Admitting: Obstetrics and Gynecology

## 2023-01-03 VITALS — BP 112/75 | HR 71 | Temp 96.5°F | Resp 20 | Wt 208.5 lb

## 2023-01-03 DIAGNOSIS — C539 Malignant neoplasm of cervix uteri, unspecified: Secondary | ICD-10-CM

## 2023-01-03 NOTE — Progress Notes (Signed)
Gynecologic Oncology Interval Visit   Referring Provider: Dr. Dalbert Garnet  Chief Complaint: Cervical Cancer  Subjective:  Kylie [name] is a G25P4 32 y.o. 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 No obvious extension to vagina or parametria on imaging.  Underwent c section with salpinectomy, bilateral ovarian transposition into paracolic gutters on 11/18/21. She is s/p concurrent cisplatin chemotherapy (12/30/21-02/09/22) and EBRT (12/26/21-02/03/22) followed by brachytherapy (02/10/22-02/27/22).   She presents for recollection of Pap.    Prior Wet prep on 12/27/2022  negative.    Objective:  Physical Examination:  BP 112/75   Pulse 71   Temp (!) 96.5 F (35.8 C)   Resp 20   Wt 208 lb 8 oz (94.6 kg)   SpO2 100%   BMI 35.79 kg/m     GENERAL: Patient is a well appearing female in no acute distress NEURO:  Nonfocal. Well oriented.  Appropriate affect.  Pelvic: EGBUS: no lesions Cervix: normal size without any mass or nodularity. The cervix does bleed easily with touch and looks reddish and fragile.   Pap obtained Vagina: friable posterior wall    Lab Review   Chemistry      Component Value Date/Time   NA 135 02/03/2022 1008   K 4.1 02/03/2022 1008   CL 101 02/03/2022 1008   CO2 27 02/03/2022 1008   BUN 21 (H) 02/03/2022 1008   CREATININE 0.83 02/03/2022 1008      Component Value Date/Time   CALCIUM 8.7 (L) 02/03/2022 1008   ALKPHOS 76 01/27/2022 1039   AST 14 (L) 01/27/2022 1039   ALT 18 01/27/2022 1039   BILITOT 0.5 01/27/2022 1039     Lab Results  Component Value Date   WBC 3.1 (L) 02/03/2022   HGB 10.6 (L) 02/03/2022   HCT 32.7 (L) 02/03/2022   MCV 90.1 02/03/2022   PLT 157 02/03/2022     Radiologic Imaging:  PET/CT 05/26/22 FINDINGS: Mediastinal blood pool activity: SUV max 1.3   Liver activity: SUV max NA   NECK:  No hypermetabolic cervical adenopathy.   Hypermetabolic hyperplasia of the tonsils slightly asymmetric to the right with a max SUV of 6.7 is commonly reactive.   Incidental CT findings: None.   CHEST: No hypermetabolic pulmonary nodules or masses. No hypermetabolic thoracic adenopathy.   Incidental CT findings: Right chest Port-A-Cath with tip in the SVC.   ABDOMEN/PELVIS: Significant interval decrease in size and FDG avidity in the ill-defined lesion involving the uterine cervix now with a focal area of FDG avidity along the left side of the cervix with a max SUV of 4.0 previously 24.0 no significant FDG avidity within the pelvic and mesorectal/presacral soft tissues.   Mild homogeneous FDG uptake within the fundal endometrium.   No abnormal hypermetabolic activity within the liver, pancreas, adrenal glands, or spleen.   No hypermetabolic lymph nodes in the abdomen or pelvis.   Incidental CT findings: Wall thickening of a nondistended urinary bladder with perivesicular stranding. Tubal ligation clips.   SKELETON: No focal hypermetabolic activity to suggest skeletal metastasis.   Incidental CT findings: None.   IMPRESSION: 1. Significant interval decrease in size and FDG avidity in the ill-defined lesion involving the uterine cervix, compatible with treatment response. 2. No evidence of distant hypermetabolic metastatic disease. 3. Mild homogeneous FDG uptake within the fundal endometrium is favored reactive or physiologic. Attention on follow-up imaging suggested. 4. No  significant FDG avidity within the pelvic and mesorectal/presacral soft tissues. Favored to reflect posttreatment change. 5. 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 Consider correlation with urinalysis.  08/31/2022 PET EXAM: NUCLEAR MEDICINE PET SKULL BASE TO THIGH   TECHNIQUE: 12.3 mCi F-18 FDG was injected  intravenously. Full-ring PET imaging was performed from the skull base to thigh after the radiotracer. CT data was obtained and used for attenuation correction and anatomic localization.   Fasting blood glucose: 105 mg/dl   COMPARISON:  16/12/9602.   FINDINGS: Mediastinal blood pool activity: SUV max 1.7   Liver activity: SUV max NA   NECK:   Symmetric tonsillar uptake without CT correlate. No abnormal hypermetabolism.   Incidental CT findings:   None.   CHEST:   Similar prevascular soft tissue with mild hypermetabolism, likely thymic hyperplasia/rebound. No additional abnormal hypermetabolism.   Incidental CT findings:   Right IJ Port-A-Cath terminates in the low SVC. Heart is at the upper limits of normal in size to mildly enlarged. No pericardial or pleural effusion.   ABDOMEN/PELVIS:   Residual vague uptake associated with the endometrium and cervix, SUV max 3.3, similar. No additional abnormal hypermetabolism.   Incidental CT findings:   Liver, gallbladder, adrenal glands, kidneys, spleen pancreas, stomach and bowel are grossly unremarkable.   SKELETON:   No abnormal hypermetabolism.   Incidental CT findings:   None.   IMPRESSION: Residual vague endometrial and cervical hypermetabolism, similar to 05/30/2022. No evidence of distant metastatic disease.     12/06/22 PET  FINDINGS: Mediastinal blood pool activity: SUV max 2.4   Liver activity: SUV max   NECK:   Minimally hypermetabolic level 2 lymph nodes with index node on the left measuring 9 mm (6/20), SUV max 2.8. Bilateral tonsillar hypermetabolism.   Incidental CT findings:   None.   CHEST:   Similar mildly hypermetabolic thymic tissue, likely due to hyperplasia/rebound. Otherwise, no abnormal hypermetabolism.   Incidental CT findings:   Right IJ Port-A-Cath terminates in the SVC. Heart is mildly enlarged. No pericardial or pleural effusion.   ABDOMEN/PELVIS:   Similar  low-level cervical metabolism, SUV max 4.0. No additional abnormal hypermetabolism.   Incidental CT findings:   Liver, gallbladder, adrenal glands, kidneys, spleen, pancreas, stomach and bowel are grossly unremarkable.   SKELETON:   No abnormal hypermetabolism.   Incidental CT findings:   None.   IMPRESSION: 1. Similar low level cervical metabolism. No evidence of metastatic disease. 2. Mildly hypermetabolic small to borderline enlarged cervical lymph nodes, likely reactive, especially given tonsillar hypermetabolism.   Assessment:  Kylie [name] is a 32 y.o. 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 No obvious extension to vagina or parametria on imaging. Underwent c section with salpinectomy, bilateral ovarian transposition into paracolic gutters on 11/18/21. She is s/p concurrent cisplatin chemotherapy (12/30/21-02/09/22) and EBRT (12/26/21-02/03/22) followed by brachytherapy (02/10/22-02/27/22). PET 05/30/22 reveals improvement in size and fdg avidity of mass.  Only small area of low SUV in cervix with stable repeat PET 08/2022.  Exam reassuring. Repeat PET 12/20/22 reveals similar low level cervical metabolism without evidence of metastatic disease. Mildly hypermetabolic small to borderline enlarged cervical lymph nodes thought to be reactive.    She has post coital bleeding and pain that could be related to atrophy post radiation and menopause.  The PET this month is reassuring with stable low level activity.  Wet prep negative. Discharge may be due  to radiation changes vs recurrence less likely given PET. Pap reobtained   Menopause post radiation and was prescribed low dose OC, but it is hard for her to remember to take it.     Plan:   Problem List Items Addressed This Visit       Genitourinary   Cervical cancer, FIGO stage IB (HCC) - Primary   Relevant Orders   IGP, Aptima HPV      PAP  repeated today.  Follow up to be determined based on Pap. If negative then RTC 3 months or sooner as noted above.    The patient's diagnosis, an outline of the further diagnostic and laboratory studies which will be required, the recommendation, and alternatives were discussed.  All questions were answered to the patient's satisfaction.  I personally had a face to face interaction and evaluated the patient. I have reviewed her history and available records and have performed the physical exam.  I have discussed the case with the patient.   Kylie Goetsch Leta Jungling, MD

## 2023-01-10 LAB — IGP, APTIMA HPV: HPV Aptima: POSITIVE — AB

## 2023-01-11 ENCOUNTER — Telehealth: Payer: Self-pay

## 2023-01-11 NOTE — Telephone Encounter (Signed)
Voicemail left to return call to provide results of pap smear results. Follow up recommendations for 3 months.

## 2023-01-17 ENCOUNTER — Other Ambulatory Visit: Payer: Self-pay

## 2023-02-16 IMAGING — DX DG HAND COMPLETE 3+V*L*
3 series · 3 of 3 positions shown · non-contrast
Comparison: None.

CLINICAL DATA: Left hand pain and swelling

EXAM:
LEFT HAND - COMPLETE 3+ VIEW

[hand ap]
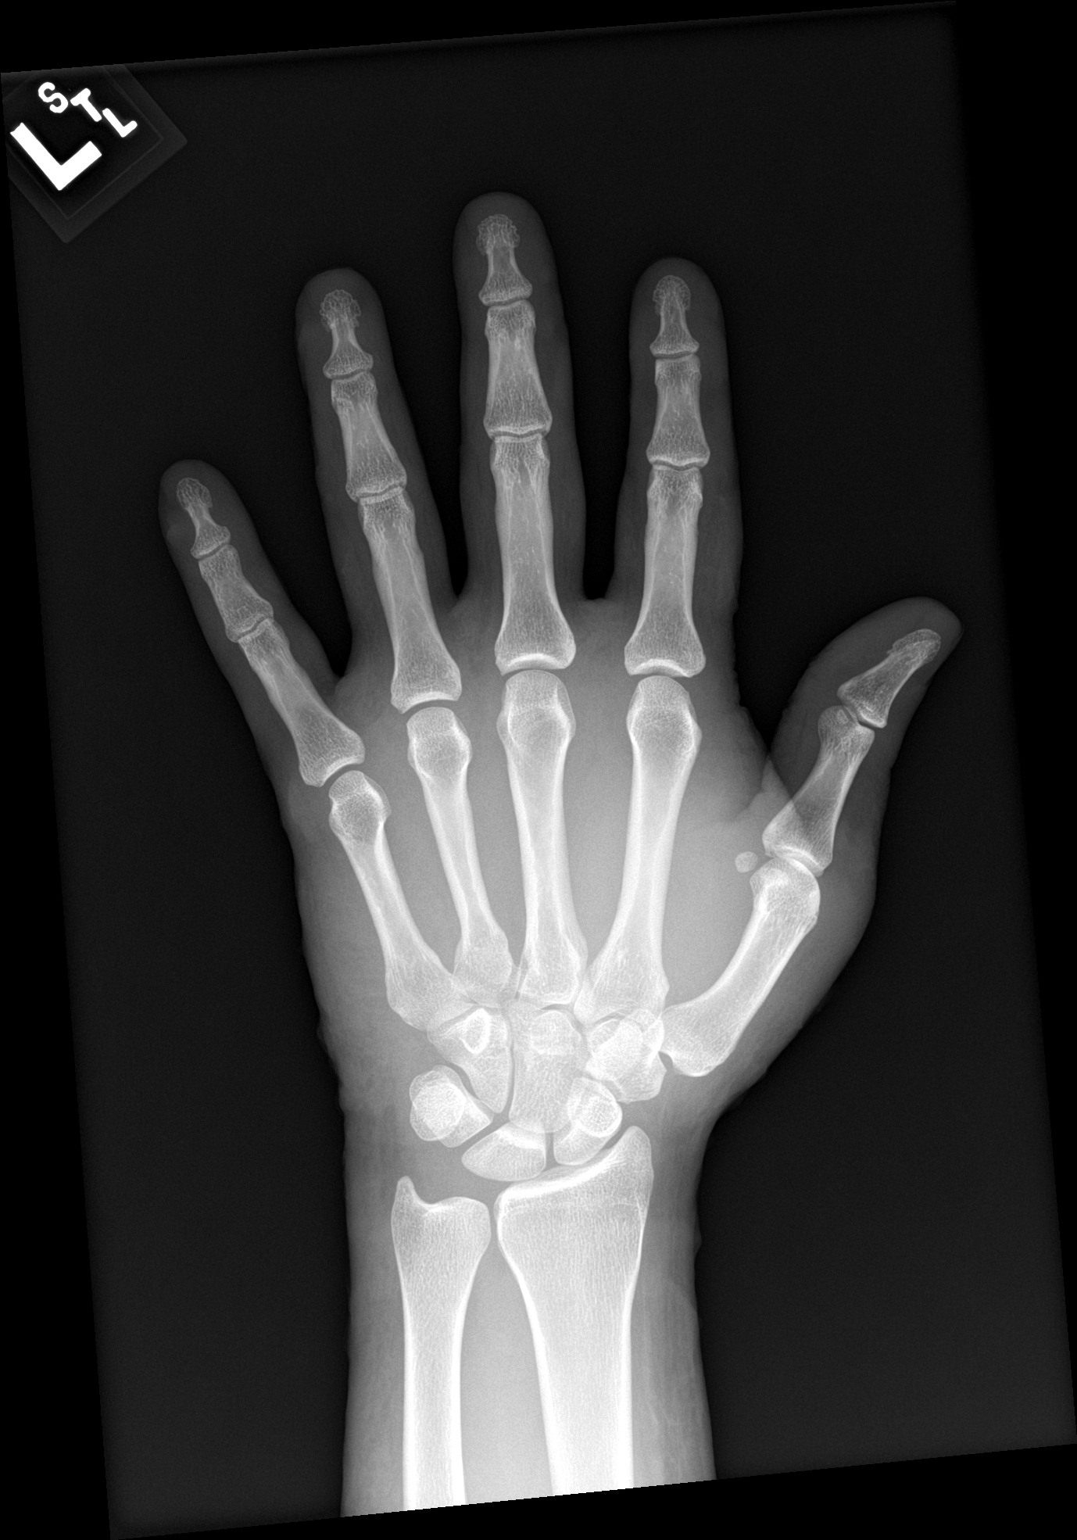

[hand obl]
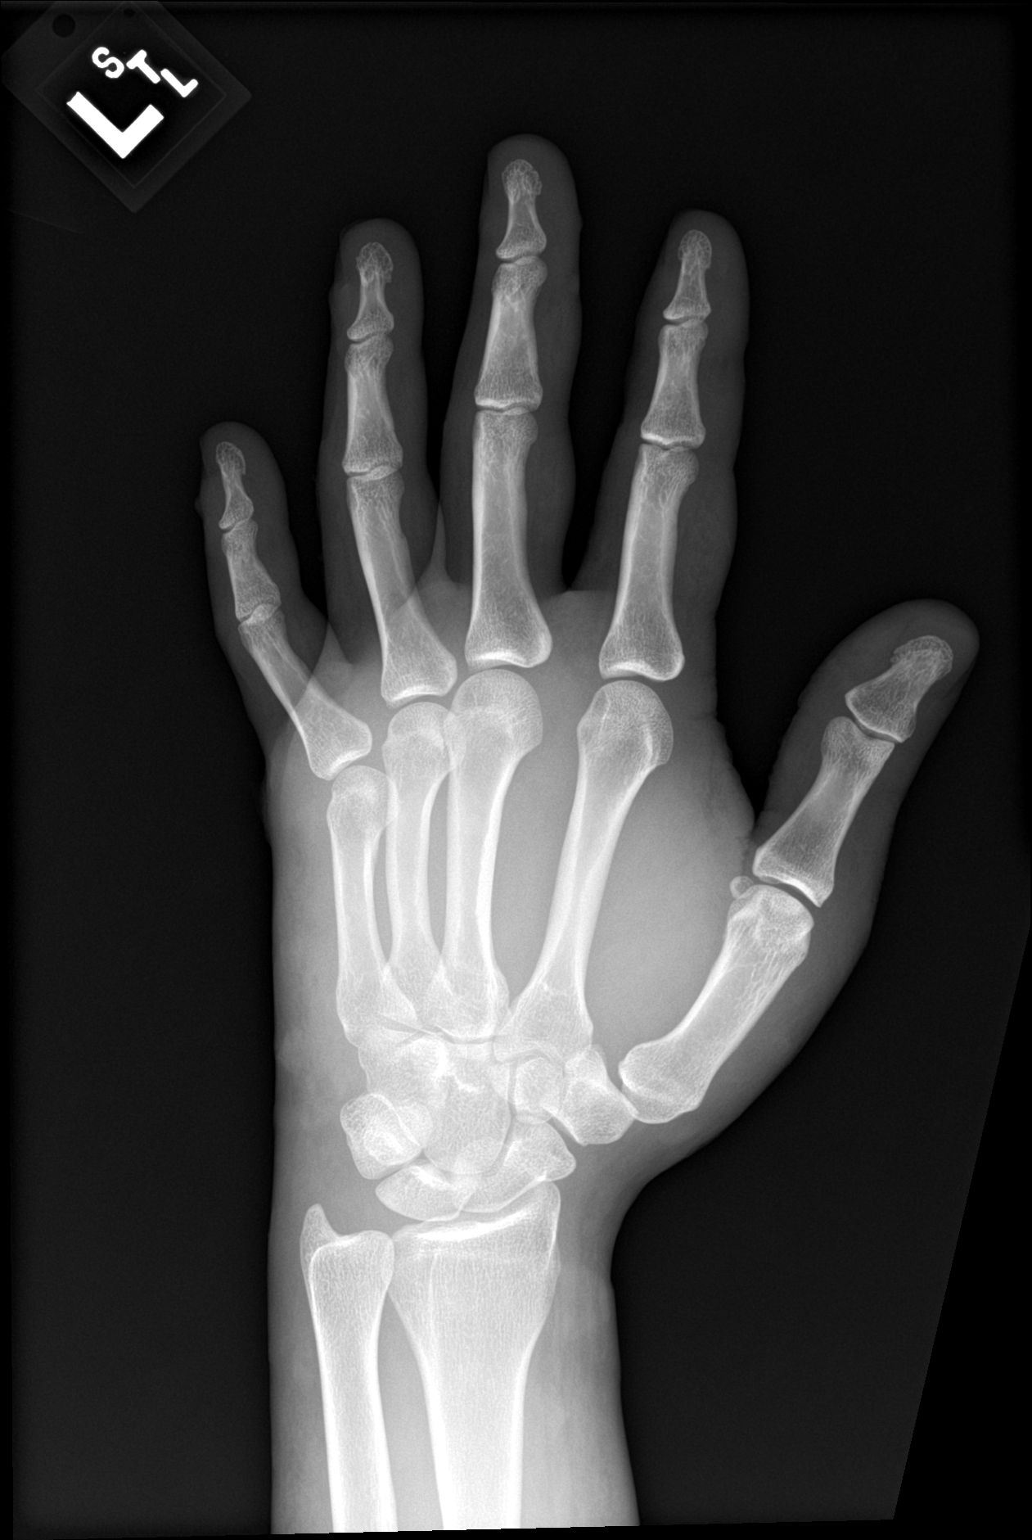

[hand lat]
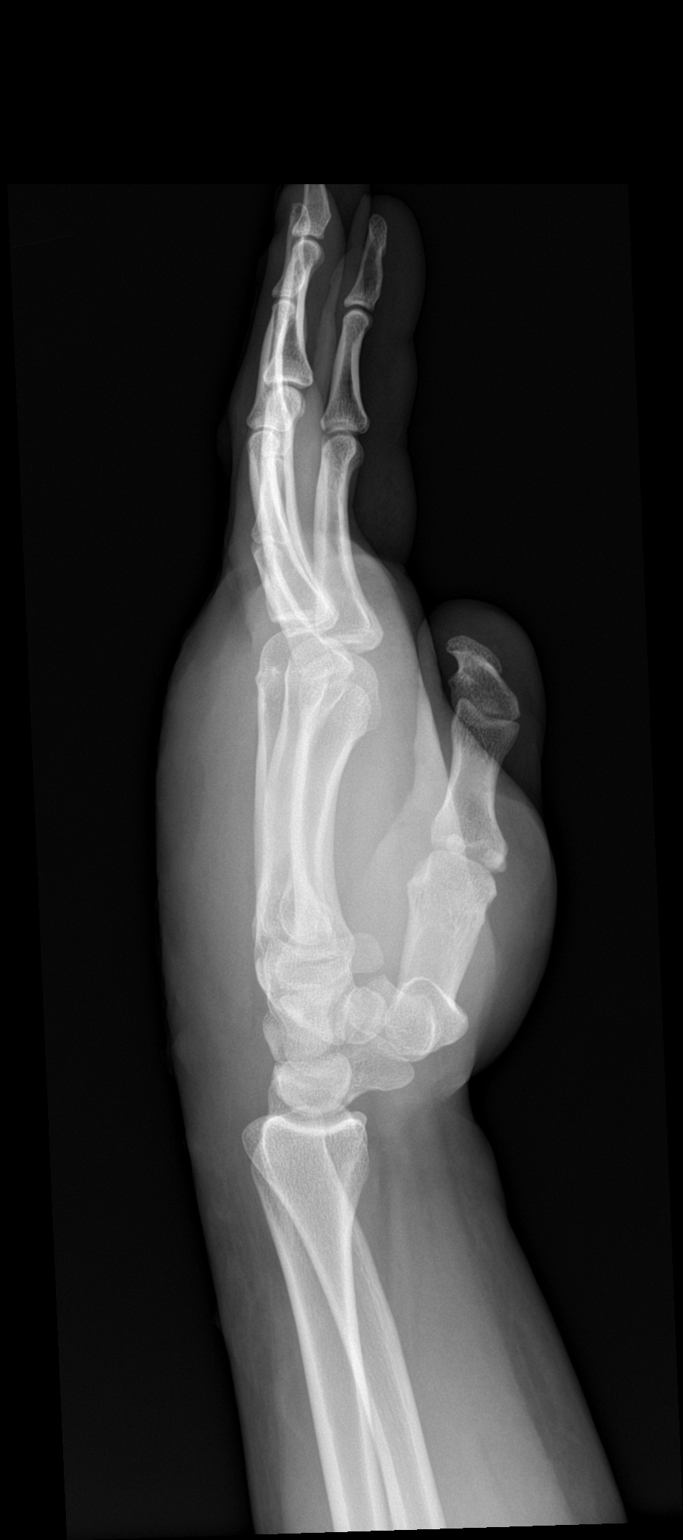

[3 of 3 positions shown; findings below may reference images not displayed]

FINDINGS: Normal alignment. No fracture or dislocation. Joint spaces are
preserved. There is extensive soft tissue swelling involving the
dorsal and palmar soft tissues of the left hand.
IMPRESSION: Extensive soft tissue swelling.  No fracture or dislocation.

## 2023-02-26 ENCOUNTER — Telehealth: Payer: Medicaid Other | Admitting: Nurse Practitioner

## 2023-02-27 ENCOUNTER — Inpatient Hospital Stay: Payer: Medicaid Other | Attending: Internal Medicine | Admitting: Nurse Practitioner

## 2023-02-27 DIAGNOSIS — E28310 Symptomatic premature menopause: Secondary | ICD-10-CM

## 2023-02-27 NOTE — Progress Notes (Signed)
No answer for appt. Left vm. Will have scheduling reach out to her to reschedule.

## 2023-03-02 ENCOUNTER — Encounter: Payer: Self-pay | Admitting: Nurse Practitioner

## 2023-03-02 ENCOUNTER — Inpatient Hospital Stay: Payer: Medicaid Other | Attending: Internal Medicine | Admitting: Nurse Practitioner

## 2023-03-02 DIAGNOSIS — R110 Nausea: Secondary | ICD-10-CM

## 2023-03-02 DIAGNOSIS — E28310 Symptomatic premature menopause: Secondary | ICD-10-CM

## 2023-03-02 DIAGNOSIS — Z95828 Presence of other vascular implants and grafts: Secondary | ICD-10-CM

## 2023-03-02 MED ORDER — ESTRADIOL 0.075 MG/24HR TD PTTW: 1.0000 | MEDICATED_PATCH | TRANSDERMAL | 12 refills | Status: DC

## 2023-03-02 NOTE — Progress Notes (Unsigned)
Virtual Visit Progress Note  Symptom Management Clinic  [facility] A Department of the [facility] [address], [address] [place], [place] [address] [phone] (phone) [phone] (fax)  I connected with Kylie [name] on 03/02/23 at 11:30 AM EST by video enabled telemedicine visit and verified that I am speaking with the correct person using two identifiers.   I discussed the limitations, risks, security and privacy concerns of performing an evaluation and management service by telemedicine and the availability of in-person appointments. I also discussed with the patient that there may be a patient responsible charge related to this service. The patient expressed understanding and agreed to proceed.   Other persons participating in the visit and their role in the encounter: none   Patient's location: home  Provider's location: clinic   Chief Complaint: hormonal therapy    Patient Care Team: Pcp, No as PCP - General Haroldine Laws, CNM as PCP - OBGYN (Certified Nurse Midwife) Debbe Odea, MD as PCP - Cardiology (Cardiology) Benita Gutter, RN as Oncology Nurse Navigator Carmina Miller, MD as Consulting Physician (Radiation Oncology) Michaelyn Barter, MD as Consulting Physician (Oncology) Marinell Blight, Marcelyn Ditty, MD as Referring Physician (Radiation Oncology) Carolan Shiver, MD as Consulting Physician (General Surgery)   Name of the patient: Kylie [name]  [id number]  04-24-1990   Date of visit: 03/02/23  Diagnosis- cervical cancer  Chief complaint/ Reason for visit- hormonal therapy  Heme/Onc history:  Oncology History  Cervical cancer, FIGO stage IB (HCC)  11/08/2021 Initial Diagnosis   Patient [id number] 35w pregnant admitted on 11/11/2021 by Dr. Dalbert Garnet for cervical mass, preterm contractions, vaginal bleeding and discharge.  Sample of cervical mass were taken and sent to pathology.    11/15/2021 Cancer Staging   Staging form: Cervix Uteri, AJCC Version 9 - Clinical stage from 11/15/2021: FIGO Stage IB3 (cT1b3, cN0, cM0) - Signed by Michaelyn Barter, MD on 12/06/2021 Histopathologic type: Squamous cell carcinoma, NOS Stage prefix: Initial diagnosis   11/15/2021 Pathology Results   DIAGNOSIS:  A.  CERVIX; BIOPSY:  - INVASIVE SQUAMOUS CELL CARCINOMA.  - SEE COMMENT.   Comment:  The biopsy fragments demonstrate moderately differentiated invasive squamous cell carcinoma.  Due to tangential sectioning the depth of invasion cannot be determined.    11/18/2021 Procedure   She delivered . S/p primary Classical Cesarean Section through Pfannenstiel incision; bilateral tubal sterilization by salpingectomy; bilateral ovarian transposition into the paracolic gutters with hemoclips placed on the medial borders of the ovaries to identify during radiation   12/30/2021 -  Chemotherapy   Patient is on Treatment Plan : CERVICAL Cisplatin (40) q7d + XRT        Interval history- Patient agrees to telemedicine for follow up of her hormonal therapy for management of menopausal symptoms post treatment for cervical cancer. She reports improvement in hot flashes and night sweats. Mood swings persist but slightly improved. Complains of ongoing nausea since completing chemotherapy and would like to be evaluated for it. She has port in place.   Review of systems- Review of Systems  Constitutional:  Positive for malaise/fatigue. Negative for chills, fever and weight loss.  Respiratory:  Negative for cough, hemoptysis, shortness of breath and wheezing.   Cardiovascular:  Negative for chest pain, palpitations and leg swelling.  Gastrointestinal:  Positive for nausea. Negative for abdominal pain, blood in stool, constipation, diarrhea, melena and vomiting.  Genitourinary:  Negative for dysuria, flank pain, hematuria and urgency.  Musculoskeletal:  Negative for back pain, falls, joint pain and myalgias.   Skin:  Negative for itching and rash.  Neurological:  Negative for dizziness, tingling, sensory change, loss of consciousness, weakness and headaches.  Endo/Heme/Allergies:  Negative for environmental allergies. Does not bruise/bleed easily.  Psychiatric/Behavioral:  Negative for depression. The patient is not nervous/anxious and does not have insomnia.      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    12wk u/s:NT WNL; NB seen; FHR 165; Cervix 4.68cm; previa noted.     UTI (lower urinary tract infection)     Past Surgical History:  Procedure Laterality Date   CESAREAN SECTION WITH BILATERAL TUBAL LIGATION Bilateral 11/17/2021   Procedure: CLASSICAL CESAREAN SECTION WITH BILATERAL TUBAL LIGATION BY SALPINGECTOMY AND BILATERAL OOPHEROPEXY;  Surgeon: Christeen Douglas, MD;  Location: [facility] ORS;  Service: Obstetrics;  Laterality: Bilateral;   HAND SURGERY Left    ORIF ELBOW FRACTURE Right 12/02/2012   Procedure: OPEN REDUCTION INTERNAL FIXATION (ORIF) ELBOW/OLECRANON FRACTURE;  Surgeon: Marlowe Shores, MD;  Location: [facility] OR;  Service: Orthopedics;  Laterality: Right;   PORTACATH PLACEMENT N/A 12/26/2021   Procedure: INSERTION PORT-A-CATH;  Surgeon: Carolan Shiver, MD;  Location: [facility] ORS;  Service: General;  Laterality: N/A;    Social History   Socioeconomic History   Marital status: Significant Other    Spouse name: Not on file   Number of children: Not on file   Years of education: Not on file   Highest education level: Not on file  Occupational History   Not on file  Tobacco Use   Smoking status: Former    Current packs/day:  0.00    Average packs/day: 0.3 packs/day for 1 year (0.3 ttl pk-yrs)    Types: Cigarettes    Start date: 03/27/2018    Quit date: 03/28/2019    Years since quitting: 3.9   Smokeless tobacco: Never   Tobacco comments:    electronic- e-sigs-mostly  Vaping Use   Vaping status: Some Days   Substances: Nicotine, THC, Flavoring  Substance and Sexual Activity   Alcohol use: Not Currently    Comment: rarely   Drug use: Not Currently    Types: Marijuana    Comment: Delta 8 THC, Jul 25 2021   Sexual activity: Yes    Birth control/protection: None  Other Topics Concern   Not on file  Social History Narrative   Not on file   Social Determinants of Health   Financial Resource Strain: Low Risk  (01/27/2022)   Overall Financial Resource Strain (CARDIA)    Difficulty of Paying Living Expenses: Not very hard  Food Insecurity: No Food Insecurity (01/27/2022)   Hunger Vital Sign    Worried About Running Out of Food in the Last Year: Never true    Ran Out of Food in the Last Year: Never true  Transportation Needs: No Transportation Needs (01/27/2022)   PRAPARE - Administrator, Civil Service (Medical): No    Lack of Transportation (Non-Medical): No  Physical Activity: Not on file  Stress: No Stress Concern Present (  01/27/2022)   [employer] [employer] - Occupational Stress Questionnaire    Feeling of Stress : Only a little  Social Connections: Unknown (01/27/2022)   Social Connection and Isolation Panel [NHANES]    Frequency of Communication with Friends and Family: More than three times a week    Frequency of Social Gatherings with Friends and Family: More than three times a week    Attends Religious Services: Not on file    Active Member of Clubs or Organizations: Not on file    Attends Banker Meetings: Not on file    Marital Status: Living with partner  Intimate Partner Violence: Not At Risk (01/27/2022)   Humiliation, Afraid, Rape, and Kick  questionnaire    Fear of Current or Ex-Partner: No    Emotionally Abused: No    Physically Abused: No    Sexually Abused: No    Family History  Problem Relation Age of Onset   Hyperlipidemia Father    Hypertension Father    Cancer Paternal Aunt    Cancer Paternal Grandmother    Kidney disease Paternal Grandmother    Diabetes Paternal Grandfather    Hearing loss Neg Hx      Current Outpatient Medications:    [START ON 03/05/2023] estradiol (VIVELLE-DOT) 0.075 MG/24HR, Place 1 patch onto the skin 2 (two) times a week., Disp: 8 patch, Rfl: 12   estradiol (ESTRACE VAGINAL) 0.1 MG/GM vaginal cream, Place 1 Applicatorful vaginally 3 (three) times a week. At bedtime for vaginal atrophy, Disp: 42.5 g, Rfl: 12   lidocaine-prilocaine (EMLA) cream, Apply 1 Application topically as needed. Apply to port 1 hour prior to chemotherapy, cover with plastic wrap (Patient not taking: Reported on 05/12/2022), Disp: 30 g, Rfl: 3   Multiple Vitamin (MULTI-VITAMIN) tablet, Take 1 tablet by mouth daily. (Patient not taking: Reported on 05/31/2022), Disp: , Rfl:    nifedipine 0.3 % ointment, Compounded with Lidocaine 2%, apply after each bowl movement (Patient not taking: Reported on 05/12/2022), Disp: , Rfl:    progesterone (PROMETRIUM) 200 MG capsule, Take 1 capsule (200 mg total) by mouth daily for 14 consecutive days each month., Disp: 42 capsule, Rfl: 1 No current facility-administered medications for this visit.  Facility-Administered Medications Ordered in Other Visits:    heparin lock flush 100 unit/mL, 500 Units, Intracatheter, Once PRN, Orlie Dakin, Tollie Pizza, MD   sodium chloride flush (NS) 0.9 % injection 10 mL, 10 mL, Intracatheter, PRN, Orlie Dakin, Tollie Pizza, MD  Physical exam: Exam limited due to telemedicine  There were no vitals filed for this visit. Physical Exam Constitutional:      Appearance: She is not ill-appearing.  Pulmonary:     Effort: No respiratory distress.  Neurological:      Mental Status: She is alert and oriented to person, place, and time.  Psychiatric:        Behavior: Behavior normal.     Assessment and plan- Patient is a 32 y.o. female    1)    Visit Diagnosis 1. Chronic nausea     Patient expressed understanding and was in agreement with this plan. She also understands that She can call clinic at any time with any questions, concerns, or complaints.   I discussed the assessment and treatment plan with the patient. The patient was provided an opportunity to ask questions and all were answered. The patient agreed with the plan and demonstrated an understanding of the instructions.   The patient was advised to call back or seek  an in-person evaluation if the symptoms worsen or if the condition fails to improve as anticipated.   I spent *** minutes {Blank single:19197::"face-to-face video visit time","non face-to-face telephone visit time"} dedicated to the care of this patient on the date of this encounter to include pre-visit review of <specify which records***>, face-to-face time with the patient, and post visit ordering of testing/documentation.   Thank you for allowing me to participate in the care of this very pleasant patient.   Consuello Masse, DNP, AGNP-C [facility]  CC:

## 2023-03-03 ENCOUNTER — Other Ambulatory Visit: Payer: Self-pay

## 2023-04-03 ENCOUNTER — Other Ambulatory Visit: Payer: Self-pay | Admitting: Nurse Practitioner

## 2023-04-04 ENCOUNTER — Inpatient Hospital Stay: Payer: Medicaid Other

## 2023-04-04 ENCOUNTER — Inpatient Hospital Stay: Payer: Medicaid Other | Attending: Internal Medicine

## 2023-04-04 ENCOUNTER — Encounter: Payer: Self-pay | Admitting: Internal Medicine

## 2023-04-04 ENCOUNTER — Encounter: Payer: Self-pay | Admitting: Nurse Practitioner

## 2023-04-16 NOTE — Progress Notes (Deleted)
  Cardiology Office Note:  .   Date:  04/16/2023  ID:  Kylie [name], DOB 1990/11/26, MRN [id number] PCP: Aviva Kluver  [facility] Providers Cardiologist:  Debbe Odea, MD { Click to update primary MD,subspecialty MD or APP then REFRESH:1}   History of Present Illness: Kylie [name] is a 33 y.o. female with a past medical history of cervical cancer on chemotherapy, anxiety, depression, headaches, previously been seen for asymptomatic bradycardia, who is here today for follow-up.   Prior echocardiogram completed 01/14/2022 which revealed LVEF 55 to 60%, no RWMA, strain of -23.4% with mild to moderate mitral valve regurgitation.  Previously was evaluated and was noted to have EKG that showed sinus bradycardia with a rate of 47 bpm.  She had been continuing to monitor her heart rate at home and heart rates been hanging around in the 60s.  Last chemo treatment was upcoming and she denied any other associated symptoms.   She was last seen in clinic 05/12/2022.  At that time she was doing very well.  She continue to monitor heart rate at home with a pulse ox and not had any further bradycardic episodes.  She returns to clinic today  ROS: 10 point review of system has been reviewed and considered negative with exception of what is been listed in the HPI  Studies Reviewed: .       2D echo 01/24/2022 1. Left ventricular ejection fraction, by estimation, is 55 to 60%. The  left ventricle has normal function. The left ventricle has no regional  wall motion abnormalities. Left ventricular diastolic parameters were  normal. The average left ventricular  global longitudinal strain is -23.4 %. The global longitudinal strain is  normal.   2. Right ventricular systolic function is normal. The right ventricular  size is normal. Tricuspid regurgitation signal is inadequate for assessing  PA pressure.   3. The mitral valve is normal in structure. Mild to moderate mitral valve   regurgitation. No evidence of mitral stenosis.   4. The aortic valve has an indeterminant number of cusps. Aortic valve  regurgitation is not visualized. No aortic stenosis is present.   5. The inferior vena cava is normal in size with <50% respiratory  variability, suggesting right atrial pressure of 8 mmHg.   Risk Assessment/Calculations:     No BP recorded.  {Refresh Note OR Click here to enter BP  :1}***       Physical Exam:   VS:  There were no vitals taken for this visit.   Wt Readings from Last 3 Encounters:  01/03/23 208 lb 8 oz (94.6 kg)  12/27/22 206 lb 4.8 oz (93.6 kg)  12/06/22 208 lb (94.3 kg)    GEN: Well nourished, well developed in no acute distress NECK: No JVD; No carotid bruits CARDIAC: ***RRR, no murmurs, rubs, gallops RESPIRATORY:  Clear to auscultation without rales, wheezing or rhonchi  ABDOMEN: Soft, non-tender, non-distended EXTREMITIES:  No edema; No deformity   ASSESSMENT AND PLAN: .   ***    {Are you ordering a CV Procedure (e.g. stress test, cath, DCCV, TEE, etc)?   Press F2        :[id number]}  Dispo: ***  Signed, Vastie Douty, NP

## 2023-04-17 ENCOUNTER — Ambulatory Visit: Payer: Medicaid Other | Attending: Cardiology | Admitting: Cardiology

## 2023-06-07 ENCOUNTER — Inpatient Hospital Stay: Payer: Medicaid Other | Attending: Internal Medicine | Admitting: Nurse Practitioner

## 2023-06-07 DIAGNOSIS — E28310 Symptomatic premature menopause: Secondary | ICD-10-CM | POA: Diagnosis not present

## 2023-06-07 NOTE — Progress Notes (Signed)
 Virtual Visit Progress Note  Symptom Management Clinic  [facility] at [facility] [address], [address] [place], [place] [address] [phone] (phone) [phone] (fax)  I connected with Latora Quarry on 06/07/23 at  3:30 PM EDT by telephone visit and verified that I am speaking with the correct person using two identifiers.   I discussed the limitations, risks, security and privacy concerns of performing an evaluation and management service by telemedicine and the availability of in-person appointments. I also discussed with the patient that there may be a patient responsible charge related to this service. The patient expressed understanding and agreed to proceed.   Other persons participating in the visit and their role in the encounter: none   Patient's location: home  Provider's location: clinic   Chief Complaint: hormonal therapy    Patient Care Team: Pcp, No as PCP - General Collin Deal, CNM as PCP - OBGYN (Certified Nurse Midwife) Constancia Delton, MD as PCP - Cardiology (Cardiology) Rochell Chroman, RN as Oncology Nurse Navigator Glenis Langdon, MD as Consulting Physician (Radiation Oncology) Loreatha Rodney, MD as Consulting Physician (Oncology) Americo Baker, Aimee Alf, MD as Referring Physician (Radiation Oncology) Eldred Grego, MD as Consulting Physician (General Surgery)   Name of the patient: Kylie [name]  [id number]  04/12/1990   Date of visit: 06/07/23  Diagnosis- cervical cancer  Chief complaint/ Reason for visit- hormonal therapy  Heme/Onc history:  Oncology History  Cervical cancer, FIGO stage IB (HCC)  11/08/2021 Initial Diagnosis   Patient [id number] 35w pregnant admitted on 11/11/2021 by Dr. Alvia Awkward for cervical mass, preterm contractions, vaginal bleeding and discharge.  Sample of cervical mass were taken and sent to pathology.   11/15/2021 Cancer  Staging   Staging form: Cervix Uteri, AJCC Version 9 - Clinical stage from 11/15/2021: FIGO Stage IB3 (cT1b3, cN0, cM0) - Signed by Agrawal, Kavita, MD on 12/06/2021 Histopathologic type: Squamous cell carcinoma, NOS Stage prefix: Initial diagnosis   11/15/2021 Pathology Results   DIAGNOSIS:  A.  CERVIX; BIOPSY:  - INVASIVE SQUAMOUS CELL CARCINOMA.  - SEE COMMENT.   Comment:  The biopsy fragments demonstrate moderately differentiated invasive squamous cell carcinoma.  Due to tangential sectioning the depth of invasion cannot be determined.    11/18/2021 Procedure   She delivered . S/p primary Classical Cesarean Section through Pfannenstiel incision; bilateral tubal sterilization by salpingectomy; bilateral ovarian transposition into the paracolic gutters with hemoclips placed on the medial borders of the ovaries to identify during radiation   12/30/2021 -  Chemotherapy   Patient is on Treatment Plan : CERVICAL Cisplatin  (40) q7d + XRT        Interval history- Patient agrees to telephone visit for follow up of her hormonal therapy for management of menopausal symptoms post treatment for cervical cancer. Hot flashes and night sweats have improved and are well controlled. She reports compliance with add back prometrium . Is using topical estrogen which has improved comfort with intercourse. She has not seen GI for chronic nausea but feels at the moment it has improved.    Review of systems- Review of Systems  Constitutional:  Positive for malaise/fatigue. Negative for chills, fever and weight loss.  Respiratory:  Negative for cough, hemoptysis, shortness of breath and wheezing.   Cardiovascular:  Negative for chest pain, palpitations and leg swelling.  Gastrointestinal:  Positive for nausea. Negative for abdominal pain, blood in stool, constipation, diarrhea, melena and vomiting.  Genitourinary:  Negative  for dysuria, flank pain, hematuria and urgency.  Musculoskeletal:  Negative for back  pain, falls, joint pain and myalgias.  Skin:  Negative for itching and rash.  Neurological:  Negative for dizziness, tingling, sensory change, loss of consciousness, weakness and headaches.  Endo/Heme/Allergies:  Negative for environmental allergies. Does not bruise/bleed easily.  Psychiatric/Behavioral:  Negative for depression. The patient is not nervous/anxious and does not have insomnia.      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    12wk u/s:NT WNL; NB seen; FHR 165; Cervix 4.68cm; previa noted.     UTI (lower urinary tract infection)     Past Surgical History:  Procedure Laterality Date   CESAREAN SECTION WITH BILATERAL TUBAL LIGATION Bilateral 11/17/2021   Procedure: CLASSICAL CESAREAN SECTION WITH BILATERAL TUBAL LIGATION BY SALPINGECTOMY AND BILATERAL OOPHEROPEXY;  Surgeon: Prescilla Brod, MD;  Location: [facility] ORS;  Service: Obstetrics;  Laterality: Bilateral;   HAND SURGERY Left    ORIF ELBOW FRACTURE Right 12/02/2012   Procedure: OPEN REDUCTION INTERNAL FIXATION (ORIF) ELBOW/OLECRANON FRACTURE;  Surgeon: Hedy Living, MD;  Location: [facility] OR;  Service: Orthopedics;  Laterality: Right;   PORTACATH PLACEMENT N/A 12/26/2021   Procedure: INSERTION PORT-A-CATH;  Surgeon: Eldred Grego, MD;  Location: [facility] ORS;  Service: General;  Laterality: N/A;    Social History   Socioeconomic History   Marital status: Significant Other    Spouse name: Not on file   Number of children: Not on file   Years of education: Not on file   Highest education level: Not on file  Occupational History   Not on file  Tobacco Use   Smoking status:  Former    Current packs/day: 0.00    Average packs/day: 0.3 packs/day for 1 year (0.3 ttl pk-yrs)    Types: Cigarettes    Start date: 03/27/2018    Quit date: 03/28/2019    Years since quitting: 4.1   Smokeless tobacco: Never   Tobacco comments:    electronic- e-sigs-mostly  Vaping Use   Vaping status: Some Days   Substances: Nicotine, THC, Flavoring  Substance and Sexual Activity   Alcohol use: Not Currently    Comment: rarely   Drug use: Not Currently    Types: Marijuana    Comment: Delta 8 THC, Jul 25 2021   Sexual activity: Yes    Birth control/protection: None  Other Topics Concern   Not on file  Social History Narrative   Not on file   Social Drivers of Health   Financial Resource Strain: Low Risk  (01/27/2022)   Overall Financial Resource Strain (CARDIA)    Difficulty of Paying Living Expenses: Not very hard  Food Insecurity: No Food Insecurity (01/27/2022)   Hunger Vital Sign    Worried About Running Out of Food in the Last Year: Never true    Ran Out of Food in the Last Year: Never true  Transportation Needs: No Transportation Needs (01/27/2022)   PRAPARE - Administrator, Civil Service (Medical): No    Lack of Transportation (Non-Medical): No  Physical Activity:  Not on file  Stress: No Stress Concern Present (01/27/2022)   [employer] of Occupational Health - Occupational Stress Questionnaire    Feeling of Stress : Only a little  Social Connections: Unknown (01/27/2022)   Social Connection and Isolation Panel [NHANES]    Frequency of Communication with Friends and Family: More than three times a week    Frequency of Social Gatherings with Friends and Family: More than three times a week    Attends Religious Services: Not on file    Active Member of Clubs or Organizations: Not on file    Attends Banker Meetings: Not on file    Marital Status: Living with partner  Intimate Partner Violence: Not At Risk (01/27/2022)   Humiliation, Afraid,  Rape, and Kick questionnaire    Fear of Current or Ex-Partner: No    Emotionally Abused: No    Physically Abused: No    Sexually Abused: No    Family History  Problem Relation Age of Onset   Hyperlipidemia Father    Hypertension Father    Cancer Paternal Aunt    Cancer Paternal Grandmother    Kidney disease Paternal Grandmother    Diabetes Paternal Grandfather    Hearing loss Neg Hx     Current Outpatient Medications:    estradiol  (ESTRACE  VAGINAL) 0.1 MG/GM vaginal cream, Place 1 Applicatorful vaginally 3 (three) times a week. At bedtime for vaginal atrophy, Disp: 42.5 g, Rfl: 12   estradiol  (VIVELLE -DOT) 0.075 MG/24HR, Place 1 patch onto the skin 2 (two) times a week., Disp: 8 patch, Rfl: 12   lidocaine -prilocaine  (EMLA ) cream, Apply 1 Application topically as needed. Apply to port 1 hour prior to chemotherapy, cover with plastic wrap (Patient not taking: Reported on 05/12/2022), Disp: 30 g, Rfl: 3   Multiple Vitamin (MULTI-VITAMIN) tablet, Take 1 tablet by mouth daily. (Patient not taking: Reported on 05/31/2022), Disp: , Rfl:    nifedipine 0.3 % ointment, Compounded with Lidocaine  2%, apply after each bowl movement (Patient not taking: Reported on 05/12/2022), Disp: , Rfl:    progesterone  (PROMETRIUM ) 200 MG capsule, Take 1 capsule (200 mg total) by mouth daily for 14 consecutive days each month., Disp: 42 capsule, Rfl: 1 No current facility-administered medications for this visit.  Facility-Administered Medications Ordered in Other Visits:    heparin  lock flush 100 unit/mL, 500 Units, Intracatheter, Once PRN, Adrian Alba, Timothy J, MD   sodium chloride  flush (NS) 0.9 % injection 10 mL, 10 mL, Intracatheter, PRN, Adrian Alba, Timothy J, MD  Physical exam: Exam limited due to telemedicine  There were no vitals filed for this visit. Physical Exam Pulmonary:     Effort: No respiratory distress.  Neurological:     Mental Status: She is oriented to person, place, and time.  Psychiatric:         Behavior: Behavior normal.    Assessment and plan- Patient is a 33 y.o. female    1) Estrogen deficiency- d/t treatments for cancer. Continue estradiol  0.075 mg/24 hour patch/vivelle  dot. Continue prometrium  add back 200 mg daily x 14 days of month. Continue estrace  cream for topical estrogen replacement.  2) Chronic nausea- patient experienced this prior to treatment for her malignancy and continues. Not worsening or increasing in frequency. Advised to avoid [facility] however, she feels this improves her symptoms. GI structures grossly unremarkable on previous imaging for her cervical cancer. She has not followed up on prior GI referral. Lives in [place] but prefers to keep care at [facility].  3) Port-a-cath: can  consider removal. Plan to flush at next visit and discuss removal.   Disposition:  Schedule with gyn-onc next available for cervical cancer surveillance (can do 11 or 11:30 next week) with port flush- la   Visit Diagnosis 1. Symptomatic premature menopause    Patient expressed understanding and was in agreement with this plan. She also understands that She can call clinic at any time with any questions, concerns, or complaints.   I discussed the assessment and treatment plan with the patient. The patient was provided an opportunity to ask questions and all were answered. The patient agreed with the plan and demonstrated an understanding of the instructions.   The patient was advised to call back or seek an in-person evaluation if the symptoms worsen or if the condition fails to improve as anticipated.   I spent 15 minutes non face-to-face telephone visit time dedicated to the care of this patient on the date of this encounter to include pre-visit review of recent notes, above images, face-to-face time with the patient, and post visit ordering of testing/documentation.   Thank you for allowing me to participate in the care of this patient.   Kenney Peacemaker, DNP, AGNP-[facility]  at [facility]

## 2023-06-10 ENCOUNTER — Other Ambulatory Visit: Payer: Self-pay

## 2023-06-13 ENCOUNTER — Inpatient Hospital Stay

## 2023-06-21 ENCOUNTER — Other Ambulatory Visit: Payer: Self-pay

## 2023-07-12 ENCOUNTER — Other Ambulatory Visit: Payer: Self-pay

## 2023-07-18 ENCOUNTER — Inpatient Hospital Stay: Attending: Internal Medicine

## 2023-07-18 ENCOUNTER — Inpatient Hospital Stay (HOSPITAL_BASED_OUTPATIENT_CLINIC_OR_DEPARTMENT_OTHER): Admitting: Obstetrics and Gynecology

## 2023-07-18 ENCOUNTER — Encounter: Payer: Self-pay | Admitting: Internal Medicine

## 2023-07-18 ENCOUNTER — Encounter: Payer: Self-pay | Admitting: Obstetrics and Gynecology

## 2023-07-18 VITALS — BP 102/83 | HR 87 | Temp 98.6°F | Resp 20 | Wt 200.1 lb

## 2023-07-18 DIAGNOSIS — Z8541 Personal history of malignant neoplasm of cervix uteri: Secondary | ICD-10-CM

## 2023-07-18 DIAGNOSIS — Z923 Personal history of irradiation: Secondary | ICD-10-CM | POA: Insufficient documentation

## 2023-07-18 DIAGNOSIS — C539 Malignant neoplasm of cervix uteri, unspecified: Secondary | ICD-10-CM

## 2023-07-18 DIAGNOSIS — Z79890 Hormone replacement therapy (postmenopausal): Secondary | ICD-10-CM | POA: Insufficient documentation

## 2023-07-18 DIAGNOSIS — Z9221 Personal history of antineoplastic chemotherapy: Secondary | ICD-10-CM | POA: Diagnosis not present

## 2023-07-18 DIAGNOSIS — E8941 Symptomatic postprocedural ovarian failure: Secondary | ICD-10-CM | POA: Insufficient documentation

## 2023-07-18 DIAGNOSIS — Z08 Encounter for follow-up examination after completed treatment for malignant neoplasm: Secondary | ICD-10-CM | POA: Diagnosis present

## 2023-07-18 DIAGNOSIS — Z9079 Acquired absence of other genital organ(s): Secondary | ICD-10-CM | POA: Insufficient documentation

## 2023-07-18 MED ORDER — PROGESTERONE 200 MG PO CAPS: ORAL_CAPSULE | ORAL | 1 refills | Status: AC

## 2023-07-18 MED ORDER — ESTRADIOL 0.075 MG/24HR TD PTTW: 1.0000 | MEDICATED_PATCH | TRANSDERMAL | 3 refills | Status: AC

## 2023-07-18 MED ORDER — ESTRADIOL 0.1 MG/GM VA CREA: 1.0000 | TOPICAL_CREAM | VAGINAL | 12 refills | Status: AC
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
## 2023-07-19 ENCOUNTER — Other Ambulatory Visit: Payer: Self-pay

## 2023-07-19 NOTE — Addendum Note (Signed)
 Addended by: Latisha Lasch A on: 07/19/2023 01:55 PM   Modules accepted: Orders

## 2023-07-25 LAB — IGP, APTIMA HPV: HPV Aptima: NEGATIVE

## 2023-08-07 ENCOUNTER — Telehealth: Payer: Self-pay

## 2023-08-07 NOTE — Telephone Encounter (Signed)
 Referral sent to [facility] for removal of venous access device.

## 2023-08-13 ENCOUNTER — Telehealth: Payer: Self-pay | Admitting: *Deleted

## 2023-08-13 NOTE — Telephone Encounter (Signed)
 Patient has cervical cancer and she says that she was told that she would be getting PET scans every 3 months.  The last one she has had was December 20, 2022.  She also says that she is going to get her port out soon and if there is a scan to be done she wanted to have it done before the port comes out.  Next appointment she has in the computer is just 11/21/2023 to see GYN

## 2023-09-21 IMAGING — US US OB < 14 WEEKS - US OB TV
1 series · 14 of 28 positions shown · non-contrast
Comparison: None.

CLINICAL DATA: Pelvic pain and cramping for 2 weeks. Nausea and
vomiting.

EXAM:
OBSTETRIC <14 WK US AND TRANSVAGINAL OB US
TECHNIQUE: Both transabdominal and transvaginal ultrasound examinations were
performed for complete evaluation of the gestation as well as the
maternal uterus, adnexal regions, and pelvic cul-de-sac.
Transvaginal technique was performed to assess early pregnancy.

[Series 1: us ob less than 14 weeks with ob transvaginal · 14 of 72 slices shown]
[im 3/72]
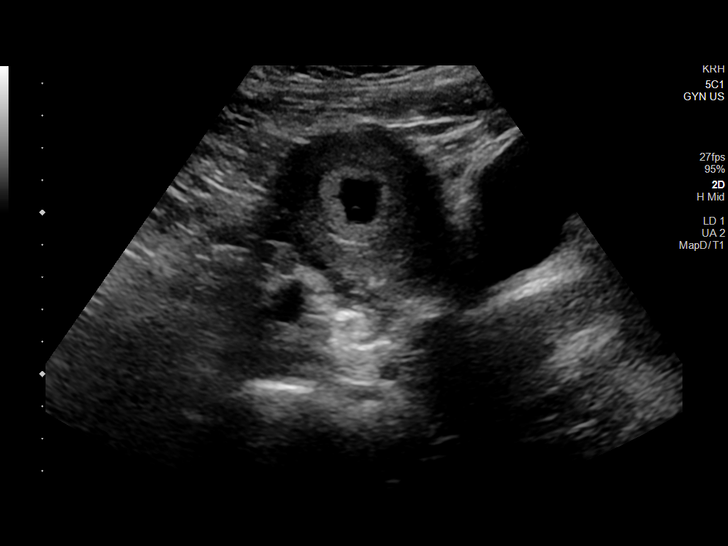
[im 8/72]
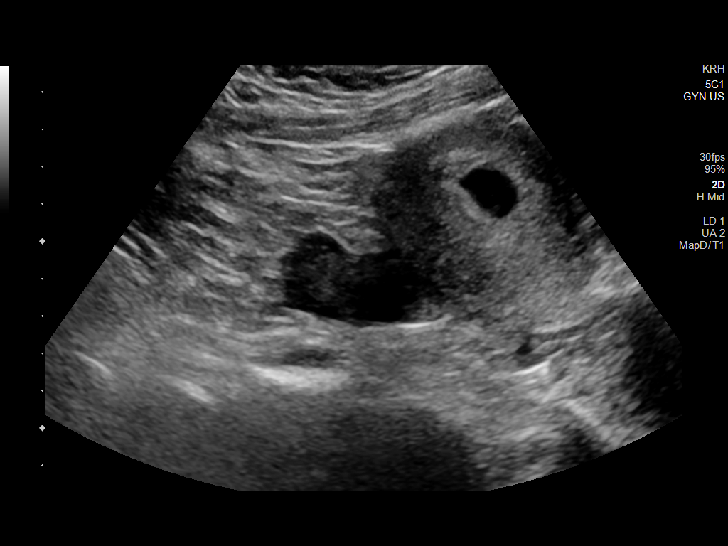
[im 14/72]
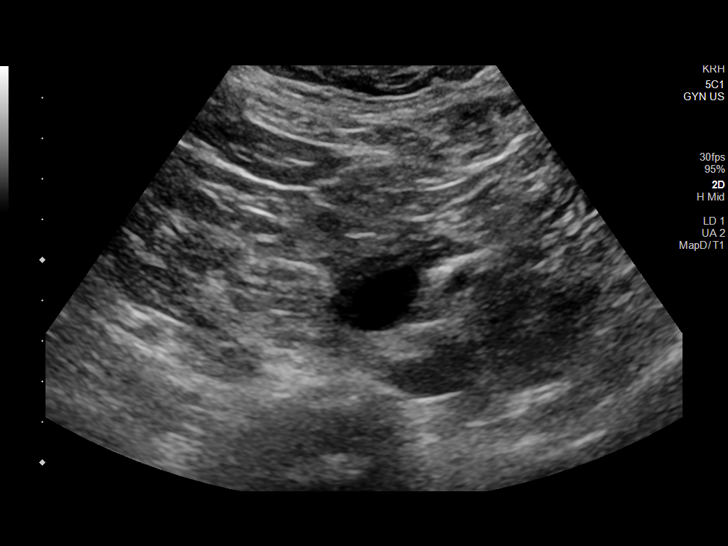
[im 19/72]
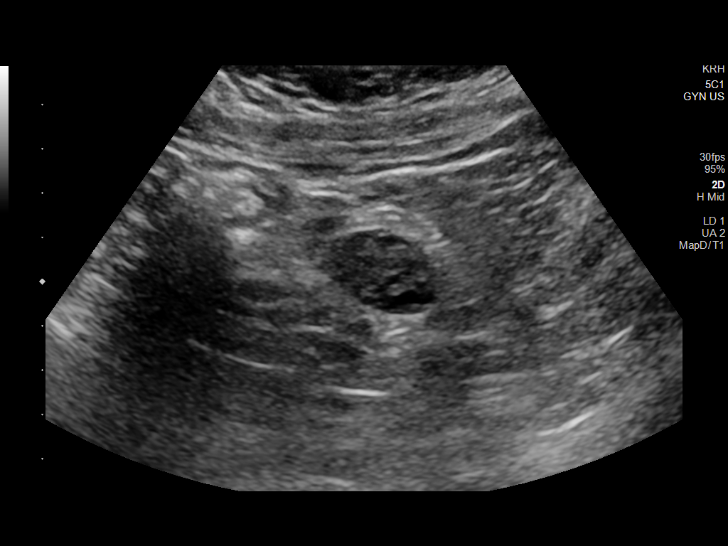
[im 24/72]
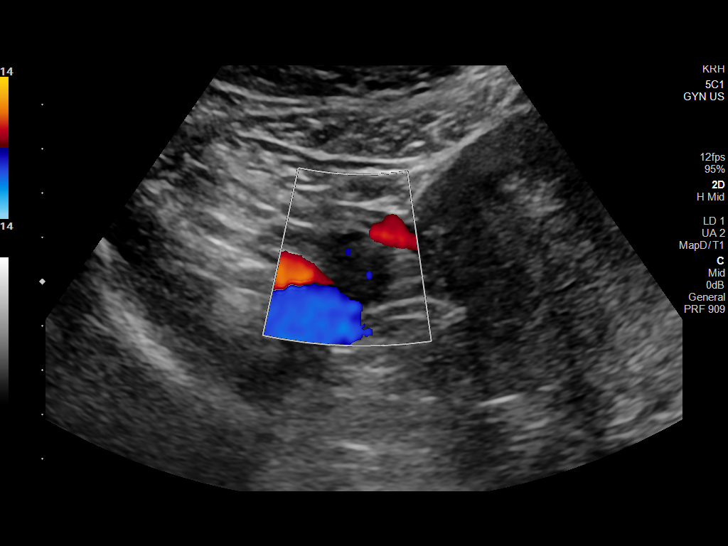
[im 29/72]
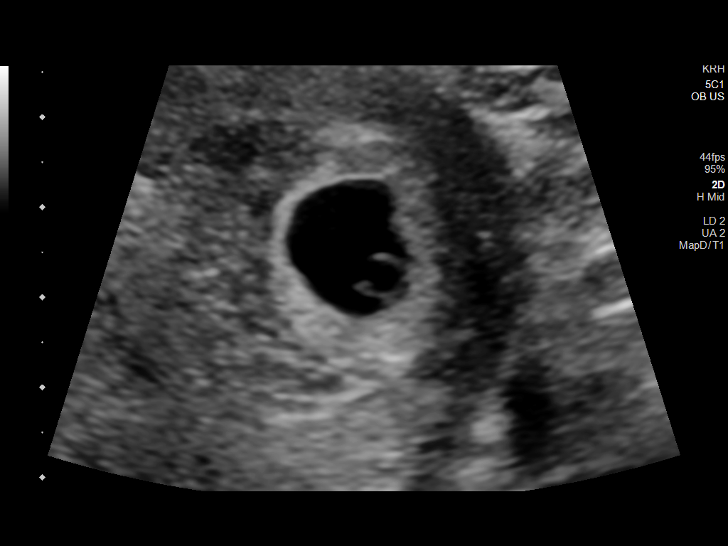
[im 35/72]
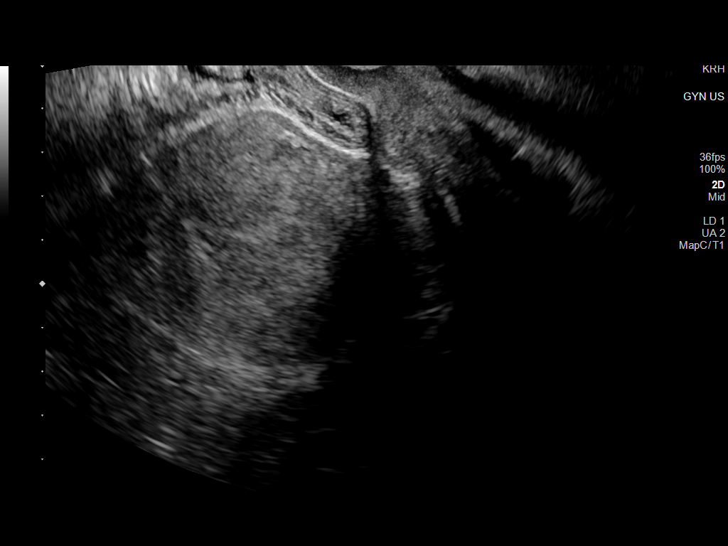
[im 40/72]
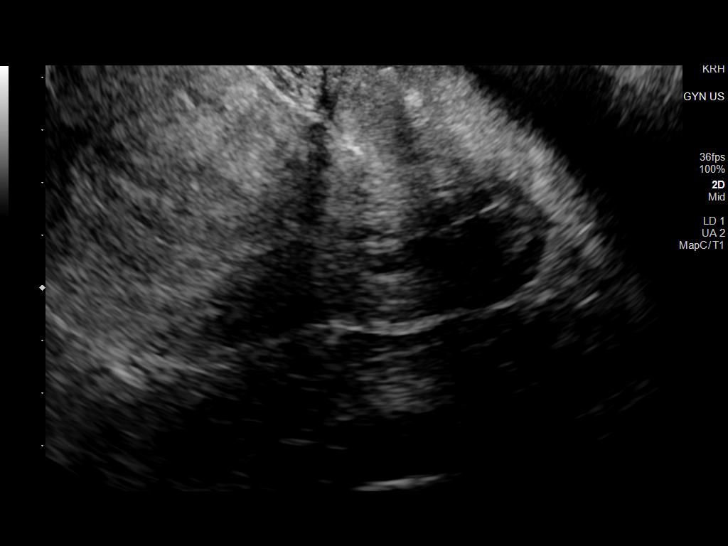
[im 45/72]
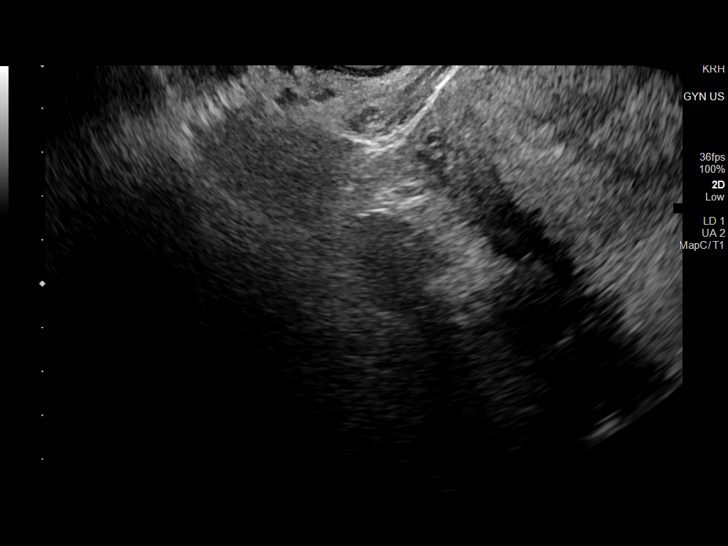
[im 50/72]
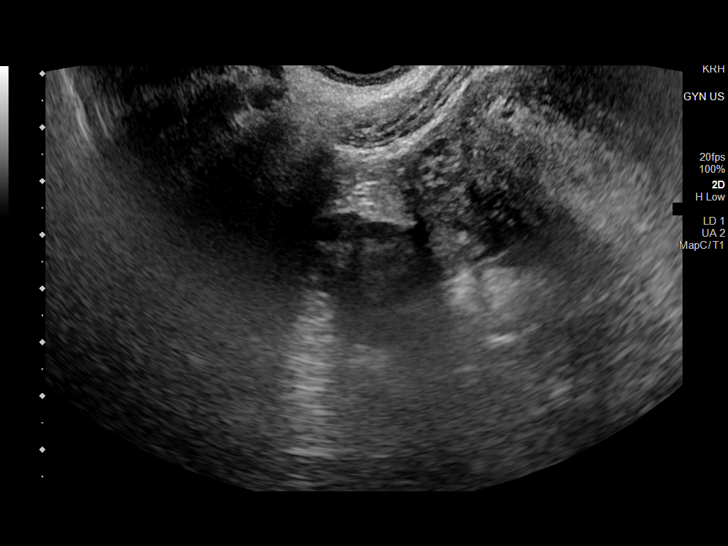
[im 56/72]
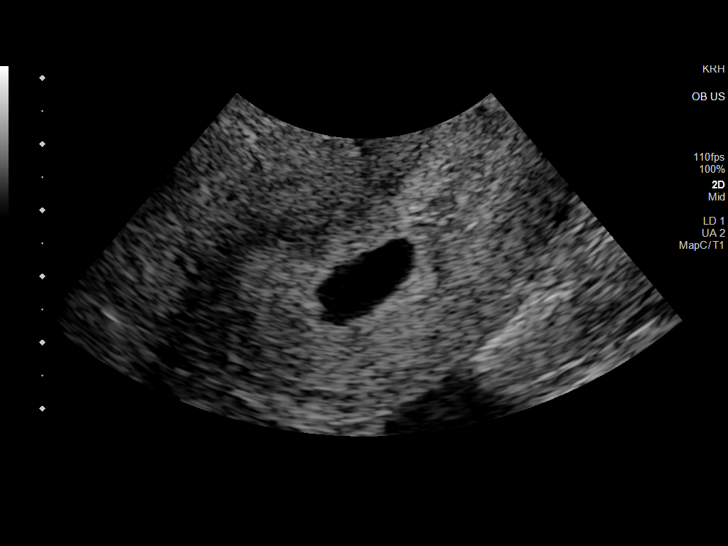
[im 61/72]
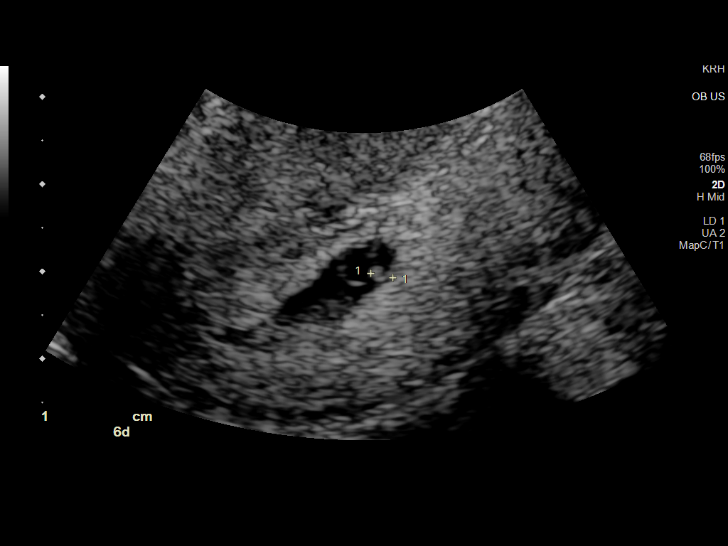
[im 66/72]
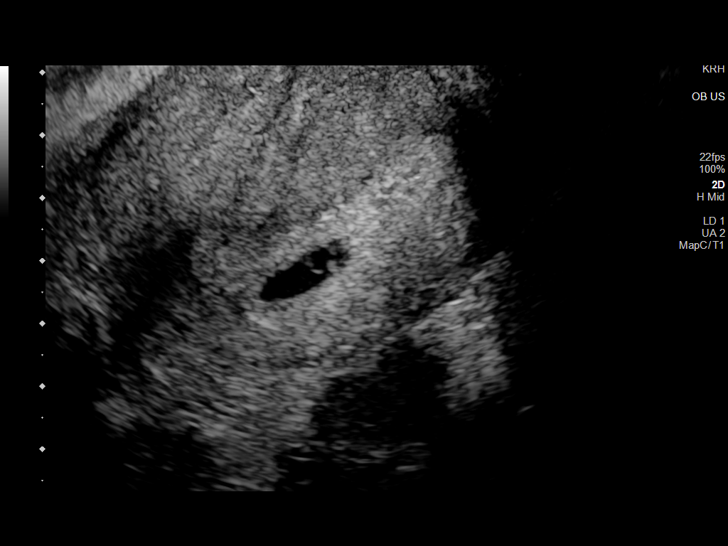
[im 72/72]
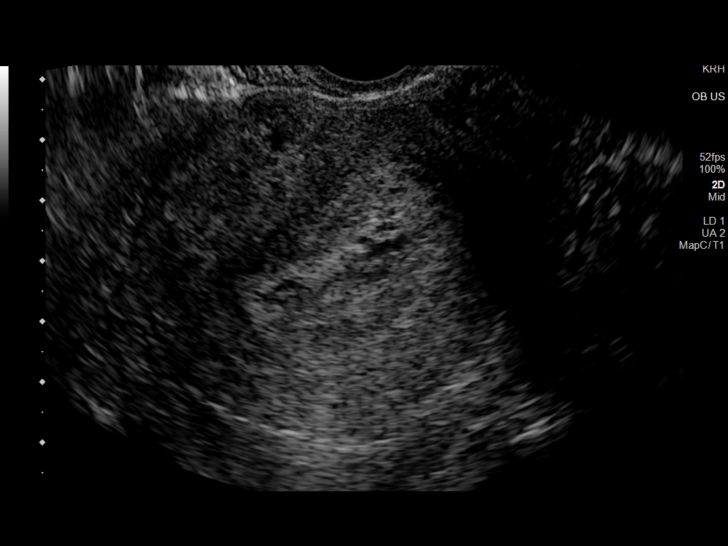

[14 of 28 positions shown; findings below may reference images not displayed]

FINDINGS: Intrauterine gestational sac: Single

Yolk sac:  Visualized.

Embryo:  Visualized.

Cardiac Activity: Visualized.

Heart Rate: 102 bpm

CRL:  3 mm   5 w   6 d                  US EDC: 12/18/2021

Subchorionic hemorrhage:  None visualized.

Maternal uterus/adnexae: Both ovaries are normal in appearance. No
mass or abnormal free fluid identified.
IMPRESSION: Single living IUP with estimated gestational age of 5 weeks 6 days,
and US EDC of 12/18/2021.

No maternal uterine or adnexal abnormality identified.

## 2023-11-21 ENCOUNTER — Inpatient Hospital Stay

## 2023-11-22 ENCOUNTER — Other Ambulatory Visit: Payer: Self-pay

## 2023-12-25 ENCOUNTER — Emergency Department (HOSPITAL_COMMUNITY)

## 2023-12-25 ENCOUNTER — Other Ambulatory Visit: Payer: Self-pay

## 2023-12-25 ENCOUNTER — Emergency Department (HOSPITAL_COMMUNITY)
Admission: EM | Admit: 2023-12-25 | Discharge: 2023-12-25 | Disposition: A | Discharge status: Home / Self Care | Attending: Emergency Medicine | Admitting: Emergency Medicine

## 2023-12-25 ENCOUNTER — Encounter (HOSPITAL_COMMUNITY): Payer: Self-pay | Admitting: Emergency Medicine

## 2023-12-25 DIAGNOSIS — D72829 Elevated white blood cell count, unspecified: Secondary | ICD-10-CM | POA: Insufficient documentation

## 2023-12-25 DIAGNOSIS — N858 Other specified noninflammatory disorders of uterus: Secondary | ICD-10-CM | POA: Diagnosis not present

## 2023-12-25 DIAGNOSIS — R102 Pelvic and perineal pain unspecified side: Secondary | ICD-10-CM

## 2023-12-25 DIAGNOSIS — B9689 Other specified bacterial agents as the cause of diseases classified elsewhere: Secondary | ICD-10-CM | POA: Diagnosis not present

## 2023-12-25 DIAGNOSIS — N949 Unspecified condition associated with female genital organs and menstrual cycle: Secondary | ICD-10-CM

## 2023-12-25 DIAGNOSIS — N760 Acute vaginitis: Secondary | ICD-10-CM | POA: Insufficient documentation

## 2023-12-25 DIAGNOSIS — Z8541 Personal history of malignant neoplasm of cervix uteri: Secondary | ICD-10-CM | POA: Insufficient documentation

## 2023-12-25 LAB — URINALYSIS, ROUTINE W REFLEX MICROSCOPIC
Bilirubin Urine: NEGATIVE
Glucose, UA: NEGATIVE mg/dL
Hgb urine dipstick: NEGATIVE
Ketones, ur: 20 mg/dL — AB
Leukocytes,Ua: NEGATIVE
Nitrite: NEGATIVE
Protein, ur: NEGATIVE mg/dL
Specific Gravity, Urine: 1.045 — ABNORMAL HIGH (ref 1.005–1.030)
pH: 9 — ABNORMAL HIGH (ref 5.0–8.0)

## 2023-12-25 LAB — COMPREHENSIVE METABOLIC PANEL WITH GFR
ALT: 11 U/L (ref 0–44)
AST: 17 U/L (ref 15–41)
Albumin: 4.6 g/dL (ref 3.5–5.0)
Alkaline Phosphatase: 95 U/L (ref 38–126)
Anion gap: 15 (ref 5–15)
BUN: 8 mg/dL (ref 6–20)
CO2: 20 mmol/L — ABNORMAL LOW (ref 22–32)
Calcium: 9.9 mg/dL (ref 8.9–10.3)
Chloride: 105 mmol/L (ref 98–111)
Creatinine, Ser: 0.91 mg/dL (ref 0.44–1.00)
GFR, Estimated: >60 mL/min (ref >60)
Glucose, Bld: 110 mg/dL — ABNORMAL HIGH (ref 70–99)
Potassium: 3.4 mmol/L — ABNORMAL LOW (ref 3.5–5.1)
Sodium: 140 mmol/L (ref 135–145)
Total Bilirubin: 0.8 mg/dL (ref 0.0–1.2)
Total Protein: 7.8 g/dL (ref 6.5–8.1)

## 2023-12-25 LAB — I-STAT CHEM 8, ED
BUN: 8 mg/dL (ref 6–20)
Calcium, Ion: 1.14 mmol/L — ABNORMAL LOW (ref 1.15–1.40)
Chloride: 106 mmol/L (ref 98–111)
Creatinine, Ser: 0.9 mg/dL (ref 0.44–1.00)
Glucose, Bld: 114 mg/dL — ABNORMAL HIGH (ref 70–99)
HCT: 40 % (ref 36.0–46.0)
Hemoglobin: 13.6 g/dL (ref 12.0–15.0)
Potassium: 3.4 mmol/L — ABNORMAL LOW (ref 3.5–5.1)
Sodium: 141 mmol/L (ref 135–145)
TCO2: 21 mmol/L — ABNORMAL LOW (ref 22–32)

## 2023-12-25 LAB — CBC WITH DIFFERENTIAL/PLATELET
Abs Immature Granulocytes: 0.04 K/uL (ref 0.00–0.07)
Basophils Absolute: 0.1 K/uL (ref 0.0–0.1)
Basophils Relative: 0 %
Eosinophils Absolute: 0 K/uL (ref 0.0–0.5)
Eosinophils Relative: 0 %
HCT: 38.8 % (ref 36.0–46.0)
Hemoglobin: 12.9 g/dL (ref 12.0–15.0)
Immature Granulocytes: 0 %
Lymphocytes Relative: 7 %
Lymphs Abs: 0.9 K/uL (ref 0.7–4.0)
MCH: 30.2 pg (ref 26.0–34.0)
MCHC: 33.2 g/dL (ref 30.0–36.0)
MCV: 90.9 fL (ref 80.0–100.0)
Monocytes Absolute: 0.4 K/uL (ref 0.1–1.0)
Monocytes Relative: 3 %
Neutro Abs: 10.7 K/uL — ABNORMAL HIGH (ref 1.7–7.7)
Neutrophils Relative %: 90 %
Platelets: 246 K/uL (ref 150–400)
RBC: 4.27 MIL/uL (ref 3.87–5.11)
RDW: 13 % (ref 11.5–15.5)
WBC: 12.1 K/uL — ABNORMAL HIGH (ref 4.0–10.5)
nRBC: 0 % (ref 0.0–0.2)

## 2023-12-25 LAB — WET PREP, GENITAL
Sperm: NONE SEEN
Trich, Wet Prep: NONE SEEN
WBC, Wet Prep HPF POC: <10 (ref <10)
Yeast Wet Prep HPF POC: NONE SEEN

## 2023-12-25 LAB — HCG, QUANTITATIVE, PREGNANCY: hCG, Beta Chain, Quant, S: 1 m[IU]/mL (ref <5)

## 2023-12-25 MED ORDER — MORPHINE SULFATE (PF) 4 MG/ML IV SOLN
4.0000 mg | Freq: Once | INTRAVENOUS | Status: AC
  Administered 2023-12-25: 4 mg via INTRAVENOUS
  Filled 2023-12-25: qty 1

## 2023-12-25 MED ORDER — HYDROMORPHONE HCL 1 MG/ML IJ SOLN
1.0000 mg | Freq: Once | INTRAMUSCULAR | Status: AC
  Administered 2023-12-25: 1 mg via INTRAVENOUS
  Filled 2023-12-25: qty 1

## 2023-12-25 MED ORDER — SODIUM CHLORIDE 0.9 % IV SOLN: 12.5000 mg | Freq: Four times a day (QID) | INTRAVENOUS | Status: DC | PRN

## 2023-12-25 MED ORDER — OXYCODONE HCL 5 MG PO TABS: 5.0000 mg | ORAL_TABLET | ORAL | 0 refills | Status: AC | PRN

## 2023-12-25 MED ORDER — ONDANSETRON HCL 4 MG/2ML IJ SOLN
4.0000 mg | Freq: Once | INTRAMUSCULAR | Status: AC
  Administered 2023-12-25: 4 mg via INTRAVENOUS
  Filled 2023-12-25: qty 2

## 2023-12-25 MED ORDER — ONDANSETRON 4 MG PO TBDP: 4.0000 mg | ORAL_TABLET | Freq: Three times a day (TID) | ORAL | 0 refills | Status: AC | PRN

## 2023-12-25 MED ORDER — METRONIDAZOLE 500 MG PO TABS: 500.0000 mg | ORAL_TABLET | Freq: Two times a day (BID) | ORAL | 0 refills | Status: AC

## 2023-12-25 MED ORDER — LACTATED RINGERS IV BOLUS
1000.0000 mL | Freq: Once | INTRAVENOUS | Status: AC
  Administered 2023-12-25: 1000 mL via INTRAVENOUS

## 2023-12-25 MED ORDER — IOHEXOL 300 MG/ML  SOLN
100.0000 mL | Freq: Once | INTRAMUSCULAR | Status: AC | PRN
Start: 2023-12-25 — End: 2023-12-25
  Administered 2023-12-25: 100 mL via INTRAVENOUS

## 2023-12-25 NOTE — ED Provider Notes (Signed)
 [place] EMERGENCY DEPARTMENT AT [facility] Provider Note   CSN: [id number] Arrival date & time: 12/25/23  9067     Patient presents with: Abdominal Pain   Kylie [name] is a 33 y.o. female with history of cervical cancer status post salpingectomy bilateral and bilateral oophoropexy, marijuana use presents to the ER for evaluation of suprapubic abdominal pain since earlier this AM. The patient reports that she woke up with the pain this morning.  Reports some nausea and vomiting with some chills and sweats but no recorded fever.  Denies any dysuria, hematuria, coffee-ground emesis or hematemesis.  Reports normal bowel movements.  Not black or bloody.  Patient denies any vaginal discharge or abnormal vaginal bleeding.  Denies any pelvic pain.  Denies any trauma.   Groin Pain Associated symptoms include abdominal pain. Pertinent negatives include no chest pain and no shortness of breath.       Prior to Admission medications   Medication Sig Start Date End Date Taking? Authorizing Provider  estradiol  (ESTRACE  VAGINAL) 0.1 MG/GM vaginal cream Place 1 Applicatorful vaginally 3 (three) times a week. At bedtime for vaginal atrophy 07/18/23   Dasie Tinnie MATSU, NP  estradiol  (VIVELLE -DOT) 0.075 MG/24HR Place 1 patch onto the skin 2 (two) times a week. 07/19/23   Dasie Tinnie MATSU, NP  lidocaine -prilocaine  (EMLA ) cream Apply 1 Application topically as needed. Apply to port 1 hour prior to chemotherapy, cover with plastic wrap Patient not taking: Reported on 07/18/2023 12/22/21   Agrawal, Kavita, MD  Multiple Vitamin (MULTI-VITAMIN) tablet Take 1 tablet by mouth daily. Patient not taking: Reported on 07/18/2023    [provider]  nifedipine 0.3 % ointment Compounded with Lidocaine  2%, apply after each bowl movement Patient not taking: Reported on 07/18/2023 01/12/22   [provider]  progesterone  (PROMETRIUM ) 200 MG capsule Take 1 capsule (200 mg total) by mouth  daily for 14 consecutive days each month. 07/18/23   Dasie Tinnie MATSU, NP    Allergies: Cat dander, Dust mite extract, Emend [aprepitant], and Fosaprepitant     Review of Systems  Constitutional:  Positive for chills and diaphoresis. Negative for fever.  Respiratory:  Negative for shortness of breath.   Cardiovascular:  Negative for chest pain.  Gastrointestinal:  Positive for abdominal pain, nausea and vomiting. Negative for blood in stool, constipation and diarrhea.  Genitourinary:  Negative for dysuria, hematuria, pelvic pain, vaginal bleeding and vaginal discharge.    Updated Vital Signs BP 118/71 (BP Location: Left Arm)   Pulse 74   Temp 99.1 F (37.3 C) (Oral)   Resp 18   SpO2 100%   Physical Exam Vitals and nursing note reviewed. Exam conducted with a chaperone present.  Constitutional:      Comments: Uncomfortable, but nontoxic  Cardiovascular:     Rate and Rhythm: Normal rate.  Pulmonary:     Effort: Pulmonary effort is normal. No respiratory distress.  Abdominal:     Palpations: Abdomen is soft.     Tenderness: There is abdominal tenderness in the right lower quadrant, suprapubic area and left lower quadrant. There is no guarding or rebound.     Comments: Diffuse lower abdominal tenderness palpation.  Soft.  No guarding or rebound.  Normal active bowel sounds.  Genitourinary:    Labia:        Right: No rash, tenderness or lesion.        Left: No rash, tenderness or lesion.      Comments: Cervical os visualized however there  is a flat cervix, likely from postradiation.  Small amount of white discharge present but no greenish discharge.  Patient does have some pedunculated red lesions/skin tags presents in the vaginal canal which is concerning.  Tissue does appear friable.  No large mass palpated on bimanual examination.  Unable to feel any cervical like tissue on bimanual examination as well. Neurological:     Mental Status: She is alert.     (all labs ordered are  listed, but only abnormal results are displayed) Labs Reviewed  CBC WITH DIFFERENTIAL/PLATELET  COMPREHENSIVE METABOLIC PANEL WITH GFR  HCG, QUANTITATIVE, PREGNANCY  URINALYSIS, ROUTINE W REFLEX MICROSCOPIC  I-STAT CHEM 8, ED    EKG: None  Radiology: US  Pelvis Complete Result Date: 12/25/2023 CLINICAL DATA:  Possible uterine mass.  History of cervical cancer. EXAM: TRANSABDOMINAL AND TRANSVAGINAL ULTRASOUND OF PELVIS TECHNIQUE: Both transabdominal and transvaginal ultrasound examinations of the pelvis were performed. Transabdominal technique was performed for global imaging of the pelvis including uterus, ovaries, adnexal regions, and pelvic cul-de-sac. It was necessary to proceed with endovaginal exam following the transabdominal exam to visualize the endometrium and ovaries. COMPARISON:  CT scan of same day. PET scan of December 06, 2022. Ultrasound of April 23, 2021. FINDINGS: Uterus Measurements: 8.1 x 4.7 x 3.8 cm = volume: 74 mL. No definite fibroids or other mass visualized. Endometrium Thickness: 2 mm. Fluid is noted in endometrial canal in lower uterine segment and toward cervix of uncertain etiology, but most likely benign. Right ovary Not visualized. Left ovary Not visualized. Other findings Small amount of free fluid is noted which most likely is physiologic. IMPRESSION: 1. No definite sonographic uterine mass is noted to correlate to abnormality seen on CT scan. MRI is recommended for further evaluation. Fluid is noted in the endometrial canal in lower uterine segment and toward cervix of uncertain etiology, but most likely benign. 2. Ovaries are not visualized. Electronically Signed   By: Lynwood Landy Raddle M.D.   On: 12/25/2023 14:36   US  Transvaginal Non-OB Result Date: 12/25/2023 CLINICAL DATA:  Possible uterine mass.  History of cervical cancer. EXAM: TRANSABDOMINAL AND TRANSVAGINAL ULTRASOUND OF PELVIS TECHNIQUE: Both transabdominal and transvaginal ultrasound examinations of the  pelvis were performed. Transabdominal technique was performed for global imaging of the pelvis including uterus, ovaries, adnexal regions, and pelvic cul-de-sac. It was necessary to proceed with endovaginal exam following the transabdominal exam to visualize the endometrium and ovaries. COMPARISON:  CT scan of same day. PET scan of December 06, 2022. Ultrasound of April 23, 2021. FINDINGS: Uterus Measurements: 8.1 x 4.7 x 3.8 cm = volume: 74 mL. No definite fibroids or other mass visualized. Endometrium Thickness: 2 mm. Fluid is noted in endometrial canal in lower uterine segment and toward cervix of uncertain etiology, but most likely benign. Right ovary Not visualized. Left ovary Not visualized. Other findings Small amount of free fluid is noted which most likely is physiologic. IMPRESSION: 1. No definite sonographic uterine mass is noted to correlate to abnormality seen on CT scan. MRI is recommended for further evaluation. Fluid is noted in the endometrial canal in lower uterine segment and toward cervix of uncertain etiology, but most likely benign. 2. Ovaries are not visualized. Electronically Signed   By: Lynwood Landy Raddle M.D.   On: 12/25/2023 14:36   CT ABDOMEN PELVIS W CONTRAST Result Date: 12/25/2023 CLINICAL DATA:  Left and right lower quadrant abdominal pain. History of cervical cancer EXAM: CT ABDOMEN AND PELVIS WITH CONTRAST TECHNIQUE: Multidetector CT imaging of  the abdomen and pelvis was performed using the standard protocol following bolus administration of intravenous contrast. RADIATION DOSE REDUCTION: This exam was performed according to the departmental dose-optimization program which includes automated exposure control, adjustment of the mA and/or kV according to patient size and/or use of iterative reconstruction technique. CONTRAST:  100mL OMNIPAQUE IOHEXOL 300 MG/ML  SOLN COMPARISON:  December 06, 2022 PET-CT FINDINGS: Lower chest: Unremarkable. Hepatobiliary: No suspicious liver  lesion. No gallstones, gallbladder wall thickening, or biliary dilatation. Pancreas: Unremarkable. Spleen: Unremarkable. Adrenals/Urinary Tract: Adrenal glands are unremarkable. No nephrolithiasis or hydronephrosis. Stomach/Bowel: No evidence of bowel obstruction. Unchanged nonspecific perirectal soft tissue thickening, likely representing post treatment changes. Appendix is unremarkable. Vascular/Lymphatic: Normal caliber aorta. No lymphadenopathy by size criteria. Reproductive: Similar appearance of inflammatory stranding about uterus, cervix, and posterior to the bladder, favored to represent post treatment changes. Hypodensity involving the uterus, indeterminate. This area is difficult to compare to priors given lack of IV contrast on prior PET-CT imaging. No adnexal masses. Other: No free air or free fluid. Musculoskeletal: No acute osseous findings. IMPRESSION: 1.  Suspected post treatment/post radiation changes in the pelvis. 2. Indeterminate hypodensity in the uterus, query focal endometrial thickening versus uterine mass. Consider dedicated pelvic ultrasound for further evaluation. Electronically Signed   By: Michaeline Blanch M.D.   On: 12/25/2023 12:19     Procedures   Medications Ordered in the ED  HYDROmorphone  (DILAUDID ) injection 1 mg (has no administration in time range)  ondansetron  (ZOFRAN ) injection 4 mg (has no administration in time range)  lactated ringers  bolus 1,000 mL (has no administration in time range)    Clinical Course as of 12/29/23 2047  Tue Dec 25, 2023  1536 Dr. Eldonna [RR]    Clinical Course User Index [RR] Bernis Ernst, PA-C   Medical Decision Making Amount and/or Complexity of Data Reviewed Labs: ordered. Radiology: ordered.  Risk Prescription drug management.   33 y.o. female presents to the ER for evaluation of abdominal pain. Differential diagnosis includes but is not limited to AAA, mesenteric ischemia, appendicitis, diverticulitis, DKA,  gastroenteritis, nephrolithiasis, pancreatitis, constipation, UTI, bowel obstruction, biliary disease, IBD, PUD, hepatitis, ectopic pregnancy, ovarian torsion, PID. Vital signs unremarkable. Physical exam as noted above.   On previous chart evaluation, appears patient was last seen by her OB/GYN in April 2025.  Patient was post to be returning every 3 months for pelvic examinations however she was lost to follow-up.  In concern given patient's history.  She did have an oopheropexy- torsion significantly less likely secondary to this. Will proceed with CT imaging plus/minus US  imaging if warranted. Pain and nausea medication ordered.   I independently reviewed and interpreted the patient's labs.  CBC does show slight leukocytosis at 12.1 with an left shift at 10.7.  No anemia.  CMP shows potassium 3 and 4, bicarb 20, glucose at 110 otherwise no electrolyte or LFT abnormality.  Urinalysis shows concentrated urine with a pH of 9.0 and ketones of 20.  Wet prep does show clue cells otherwise unremarkable.  GC pending.  hCG negative.  CT Abd shows  1.  Suspected post treatment/post radiation changes in the pelvis. 2. Indeterminate hypodensity in the uterus, query focal endometrial thickening versus uterine mass. Consider dedicated pelvic ultrasound for further evaluation. Per radiologist's interpretation.    US  Shows 1. No definite sonographic uterine mass is noted to correlate to abnormality seen on CT scan. MRI is recommended for further evaluation. Fluid is noted in the endometrial canal in lower uterine segment  and toward cervix of uncertain etiology, but most likely benign. 2. Ovaries are not visualized. Per radiologist's interpretation.   I did consult Gyn/Onc spoke with Dr. Eldonna.  No definitive plan or recommended treatment currently.  Patient will need to follow-up with her specialist for continued follow-up.  Patient's pain not under control after fluids and pain medication.  Will send her home with  some narcotic medication and nausea medication.  We Ace discussed and stressed the importance of following up with her gynecologist as she will need further testing and treatment especially given the pelvic examination and CT findings.  Patient's abdomen minimally tender now.  Will treat the clue cells with Flagyl .  She stable for discharge home with close outpatient follow-up and strict return precautions.  We discussed the results of the labs/imaging. The plan is supportive care, follow-up with gynecology. We discussed strict return precautions and red flag symptoms. The patient verbalized their understanding and agrees to the plan. The patient is stable and being discharged home in good condition.  Portions of this report may have been transcribed using voice recognition software. Every effort was made to ensure accuracy; however, inadvertent computerized transcription errors may be present.    Final diagnoses:  Bacterial vaginosis  Pelvic pain  Uterus problem    ED Discharge Orders          Ordered    oxyCODONE  (ROXICODONE ) 5 MG immediate release tablet  Every 4 hours PRN        12/25/23 1548    metroNIDAZOLE  (FLAGYL ) 500 MG tablet  2 times daily        12/25/23 1548    ondansetron  (ZOFRAN -ODT) 4 MG disintegrating tablet  Every 8 hours PRN        12/25/23 1548               Bernis Ernst, PA-C 12/29/23 2057    Pamella Ozell LABOR, DO 01/03/24 1736

## 2023-12-25 NOTE — ED Notes (Signed)
 Writer advised a urine sample was needed for urinalysis.

## 2023-12-25 NOTE — Discharge Instructions (Addendum)
 You were seen in the ER today for evaluation of your symptoms. You will need to follow up with your Gyn/Onc group. Please call them today to schedule a follow up appointment.  For pain, recommend taking 600mg  of ibuprofen  and/or 1000 mg of Tylenol  every 6 hours as needed for pain.  I have prescribed you some narcotic pain medication to take for breakthrough pain.  Please do not drive or operate heavy scenery while on this medication as make you sleepy.  I have also sent you in some nausea medication as well to take as needed.  Again, it is extremely important that you follow-up with your gynecologist/oncologist in [place].  You will need further evaluation and testing/treatment.  Your swab also showed that you had bacterial vaginosis.  I am including additional information into discharge work on this.  Please do the medication as prescribed.  Do not consume any alcohol while on the medication and start up to 72 hours afterwards as it will make you extremely ill.  If you have any concerns, new or worsening symptoms, please return to the nearest emergency department for evaluation.  Contact a doctor if: Medicine does not help your pain, or your pain comes back. You have new symptoms. You have unusual discharge or bleeding from your vagina. You have a fever or chills. You are having trouble pooping (constipation). You have blood in your pee (urine) or poop (stool). Your pee smells bad. You feel weak or light-headed. Get help right away if: You have sudden pain that is very bad. You have very bad pain and also have any of these symptoms: A fever. Feeling like you may vomit (nauseous). Vomiting. Being very sweaty. You faint. These symptoms may be an emergency. Get help right away. Call your local emergency services (911 in the [place] Do not wait to see if the symptoms will go away. Do not drive yourself to the hospital.

## 2023-12-25 NOTE — ED Triage Notes (Signed)
 Pt reports left side groin pain that started this morning. Pt diaphoretic and vomiting in triage. Denies blood in urine.

## 2023-12-25 NOTE — ED Notes (Signed)
Pt currently attempting to provide urine sample.  

## 2023-12-25 NOTE — ED Notes (Signed)
Pt gone for ultrasound.

## 2023-12-26 ENCOUNTER — Other Ambulatory Visit: Payer: Self-pay

## 2023-12-26 ENCOUNTER — Emergency Department
Admission: EM | Admit: 2023-12-26 | Discharge: 2023-12-26 | Disposition: A | Discharge status: Home / Self Care | Attending: Emergency Medicine | Admitting: Emergency Medicine

## 2023-12-26 DIAGNOSIS — R102 Pelvic and perineal pain unspecified side: Secondary | ICD-10-CM | POA: Insufficient documentation

## 2023-12-26 DIAGNOSIS — Z8541 Personal history of malignant neoplasm of cervix uteri: Secondary | ICD-10-CM | POA: Diagnosis not present

## 2023-12-26 HISTORY — DX: Malignant neoplasm of cervix uteri, unspecified: C53.9

## 2023-12-26 LAB — GC/CHLAMYDIA PROBE AMP (~~LOC~~) NOT AT ARMC
Chlamydia: NEGATIVE
Comment: NEGATIVE
Comment: NORMAL
Neisseria Gonorrhea: NEGATIVE

## 2023-12-26 MED ORDER — MORPHINE SULFATE (PF) 4 MG/ML IV SOLN
4.0000 mg | Freq: Once | INTRAVENOUS | Status: AC
  Administered 2023-12-26: 4 mg via INTRAVENOUS
  Filled 2023-12-26: qty 1

## 2023-12-26 MED ORDER — KETOROLAC TROMETHAMINE 30 MG/ML IJ SOLN
30.0000 mg | Freq: Once | INTRAMUSCULAR | Status: AC
  Administered 2023-12-26: 30 mg via INTRAVENOUS
  Filled 2023-12-26: qty 1

## 2023-12-26 MED ORDER — METOCLOPRAMIDE HCL 5 MG/ML IJ SOLN
10.0000 mg | Freq: Once | INTRAMUSCULAR | Status: AC
  Administered 2023-12-26: 10 mg via INTRAVENOUS
  Filled 2023-12-26: qty 2

## 2023-12-26 MED ORDER — SODIUM CHLORIDE 0.9 % IV BOLUS
1000.0000 mL | Freq: Once | INTRAVENOUS | Status: AC
  Administered 2023-12-26: 1000 mL via INTRAVENOUS

## 2023-12-26 NOTE — ED Provider Notes (Signed)
 [facility] Provider Note    Event Date/Time   First MD Initiated Contact with Patient 12/26/23 1121     (approximate)   History   Abdominal Pain   HPI  Kylie [name] is a 33 y.o. female with a history of cervical cancer, anxiety, headaches presents emergency department with concerns of lower abdominal pain.  Patient was seen at [facility] yesterday and was told to follow-up with her GYN or either here as we would have her records.  They did a CT, ultrasound, she states they saw something on her CT but she is not sure what and did not understand what they were telling her.  What she thought she heard was that she may have recurrent cancer.  Patient had her port removed about 2 months ago.  Continues to have pain and nausea this morning.  Did take Flagyl  without any food on her stomach.      Physical Exam   Triage Vital Signs: ED Triage Vitals [12/26/23 1113]  Encounter Vitals Group     BP 122/74     Girls Systolic BP Percentile      Girls Diastolic BP Percentile      Boys Systolic BP Percentile      Boys Diastolic BP Percentile      Pulse Rate 78     Resp 18     Temp 98.9 F (37.2 C)     Temp Source Oral     SpO2 98 %     Weight      Height      Head Circumference      Peak Flow      Pain Score      Pain Loc      Pain Education      Exclude from Growth Chart     Most recent vital signs: Vitals:   12/26/23 1113  BP: 122/74  Pulse: 78  Resp: 18  Temp: 98.9 F (37.2 C)  SpO2: 98%     General: Awake, no distress.   CV:  Good peripheral perfusion. regular rate and  rhythm Resp:  Normal effort Abd:  No distention.   Other:      ED Results / Procedures / Treatments   Labs (all labs ordered are listed, but only abnormal results are displayed) Labs Reviewed - No data to display   EKG     RADIOLOGY     PROCEDURES:   Procedures  Critical Care: None Chief Complaint  Patient presents with   Abdominal Pain       MEDICATIONS ORDERED IN ED: Medications  sodium chloride  0.9 % bolus 1,000 mL (1,000 mLs Intravenous New Bag/Given 12/26/23 1223)  metoCLOPramide  (REGLAN ) injection 10 mg (10 mg Intravenous Given 12/26/23 1213)  morphine  (PF) 4 MG/ML injection 4 mg (4 mg Intravenous Given 12/26/23 1214)  ketorolac  (TORADOL ) 30 MG/ML injection 30 mg (30 mg Intravenous Given 12/26/23 1213)     IMPRESSION / MDM / ASSESSMENT AND PLAN / ED COURSE  I reviewed the triage vital signs and the nursing notes.                              Differential diagnosis includes, but is not limited to, abdominal pain, cervical mass, uterine mass, peritoneal fluid  Patient's presentation is most consistent with acute illness / injury with system symptoms.   Medications given normal saline 1 L IV, morphine  4 mg IV,  Reglan  10 mg IV and Toradol  30 mg IV  I did review the patient's old charts, ultrasound yesterday showed some fluid, no mass that had been seen on the CT, fluid is noted to be most likely benign.  Most of her exam therefore was benign yesterday.  Her labs are reassuring yesterday also.  Since patient had a CT and a pelvic ultrasound done yesterday I do not think there are enough red flags to warrant reimaging today.  Mostly feel like patient is confused about what they told her in the emergency department and unsure of who to follow-up with.  Due to her having nausea and continued pain we will do IV fluids, pain medication and nausea medication.  At this time I feel be more appropriate for the patient to follow-up with her oncologist.  She can call and make an appointment.  She is in agreement with this treatment plan.  Gave her reassurance and tried to explain all of the findings in more detail.   Patient is feeling better after medications.  Will discharge to home.  Follow-up with her GYN oncologist.  She is in agreement treatment plan.  Discharged stable condition.   FINAL CLINICAL IMPRESSION(S) / ED  DIAGNOSES   Final diagnoses:  Pelvic pain in female     Rx / DC Orders   ED Discharge Orders     None        Note:  This document was prepared using Dragon voice recognition software and may include unintentional dictation errors.    Gasper Devere ORN, PA-C 12/26/23 1321    Dorothyann Drivers, MD 12/26/23 [phone]

## 2023-12-26 NOTE — ED Notes (Addendum)
 Pt states that they started having abd pain yesterday morning around 0600 on the RLQ with n/v. Pain traveled to the LLQ and then is now more concentrated in the center of their lower abdomen. Pt states that they haven't been able to keep anything down since yesterday and pain is a 7/10.

## 2023-12-26 NOTE — ED Notes (Signed)
 Pt discharged at this time. RN reviewed discharge instructions with pt. Pt verbalized understanding. Vital signs taken. RR even and unlabored. Pt denies any questions or needs at this time. Pt ambulatory at discharge.

## 2023-12-26 NOTE — ED Triage Notes (Signed)
 Patient states lower pelvic pain; history of cervical cancer. Was seen yesterday at [facility] for the same and reports she was told to come here to be evaluated. Per the MD note, patient was told to follow up with ONC GYN.

## 2023-12-31 ENCOUNTER — Telehealth: Payer: Self-pay | Admitting: Obstetrics and Gynecology

## 2023-12-31 NOTE — Telephone Encounter (Signed)
 Pt called and stated she was in the hospital [place] in [place] last Wednesday.   Pt stated she needs to schedule an appt with Dr.Berchuk. please advise

## 2023-12-31 NOTE — Telephone Encounter (Signed)
 I called the pt to schedule her an appt at Dr. Mancil next available, no answer so I left a vm with the appt details.

## 2024-01-01 ENCOUNTER — Other Ambulatory Visit: Payer: Self-pay

## 2024-01-25 ENCOUNTER — Encounter: Payer: Self-pay | Admitting: Internal Medicine

## 2024-01-30 ENCOUNTER — Inpatient Hospital Stay: Attending: Obstetrics and Gynecology | Admitting: Nurse Practitioner

## 2024-01-30 VITALS — BP 119/87 | HR 56 | Temp 97.8°F | Resp 18 | Wt 189.3 lb

## 2024-01-30 DIAGNOSIS — Z08 Encounter for follow-up examination after completed treatment for malignant neoplasm: Secondary | ICD-10-CM | POA: Diagnosis not present

## 2024-01-30 DIAGNOSIS — Z8541 Personal history of malignant neoplasm of cervix uteri: Secondary | ICD-10-CM

## 2024-01-30 DIAGNOSIS — E28310 Symptomatic premature menopause: Secondary | ICD-10-CM
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
## 2024-01-30 NOTE — Progress Notes (Unsigned)
 Patient reports occasional back pain.

## 2024-01-31 ENCOUNTER — Other Ambulatory Visit: Payer: Self-pay

## 2024-02-01 ENCOUNTER — Encounter: Payer: Self-pay | Admitting: Internal Medicine

## 2024-04-15 ENCOUNTER — Encounter: Payer: Self-pay | Admitting: Nurse Practitioner

## 2024-04-16 ENCOUNTER — Other Ambulatory Visit: Payer: Self-pay

## 2024-04-16 NOTE — Telephone Encounter (Unsigned)
 Error.  Spoke with Pharamacy in

## 2024-05-14 ENCOUNTER — Inpatient Hospital Stay

## 2024-07-30 ENCOUNTER — Inpatient Hospital Stay
